# Patient Record
Sex: Female | Born: 1940 | Race: Black or African American | Hispanic: No | Marital: Married
Health system: Midwestern US, Community
[De-identification: ages and names within clinical notes are randomized; demographics above are authoritative.]

## PROBLEM LIST (undated history)

## (undated) DIAGNOSIS — R1900 Intra-abdominal and pelvic swelling, mass and lump, unspecified site: Secondary | ICD-10-CM

## (undated) DIAGNOSIS — R634 Abnormal weight loss: Secondary | ICD-10-CM

## (undated) DIAGNOSIS — N281 Cyst of kidney, acquired: Secondary | ICD-10-CM

## (undated) DIAGNOSIS — I43 Cardiomyopathy in diseases classified elsewhere: Secondary | ICD-10-CM

## (undated) DIAGNOSIS — R911 Solitary pulmonary nodule: Secondary | ICD-10-CM

## (undated) DIAGNOSIS — I10 Essential (primary) hypertension: Secondary | ICD-10-CM

## (undated) DIAGNOSIS — Z1231 Encounter for screening mammogram for malignant neoplasm of breast: Secondary | ICD-10-CM

## (undated) DIAGNOSIS — Z1211 Encounter for screening for malignant neoplasm of colon: Secondary | ICD-10-CM

## (undated) DIAGNOSIS — C50812 Malignant neoplasm of overlapping sites of left female breast: Secondary | ICD-10-CM

## (undated) DIAGNOSIS — R928 Other abnormal and inconclusive findings on diagnostic imaging of breast: Secondary | ICD-10-CM

## (undated) DIAGNOSIS — K862 Cyst of pancreas: Secondary | ICD-10-CM

## (undated) DIAGNOSIS — R0602 Shortness of breath: Secondary | ICD-10-CM

## (undated) DIAGNOSIS — K869 Disease of pancreas, unspecified: Secondary | ICD-10-CM

## (undated) DIAGNOSIS — E210 Primary hyperparathyroidism: Secondary | ICD-10-CM

## (undated) DIAGNOSIS — N179 Acute kidney failure, unspecified: Principal | ICD-10-CM

## (undated) DIAGNOSIS — F02A4 Dementia in other diseases classified elsewhere, mild, with anxiety (HCC): Secondary | ICD-10-CM

## (undated) DIAGNOSIS — N1832 Chronic kidney disease, stage 3b (HCC): Principal | ICD-10-CM

## (undated) DIAGNOSIS — N184 Chronic kidney disease, stage 4 (severe): Principal | ICD-10-CM

## (undated) DIAGNOSIS — E785 Hyperlipidemia, unspecified: Secondary | ICD-10-CM

## (undated) DIAGNOSIS — E215 Disorder of parathyroid gland, unspecified: Secondary | ICD-10-CM

## (undated) DIAGNOSIS — M1990 Unspecified osteoarthritis, unspecified site: Secondary | ICD-10-CM

## (undated) HISTORY — PX: LAPAROTOMY: SHX154

## (undated) HISTORY — PX: GASTRECTOMY: SHX58

## (undated) HISTORY — PX: TONSILLECTOMY: SUR1361

## (undated) HISTORY — PX: BREAST EXCISIONAL BIOPSY: SUR124

---

## 2005-12-31 NOTE — ED Provider Notes (Signed)
[facility]                      EMERGENCY DEPARTMENT TREATMENT REPORT   NAME:  Kathryn [name]                    PT. LOCATION:     ER  ER21   MR #:         BILLING #: [id number]          DOS: 12/31/2005   TIME: 1:54 P   61-50-31   cc:    Kathryn [name], M.D.   Primary Physician:   The patient was evaluated at 1323 hours.   CHIEF COMPLAINT:   Lump on left breast with pain.   HISTORY OF PRESENT ILLNESS:  This is a 65 year old female with a previous   history of breast cancer, had a mastectomy on the left and also   chemotherapy, no radiation.  These treatments happened approximately 15   years ago.  The patient states that she noticed a tender lump around the   mastectomy scar last night.  The patient has no primary care Kathryn [name].  She   recently moved from [place].  States it is about time for her yearly chest   x-ray and mammogram.  Denies recent weight loss.  Admits to night sweats   but states she has had those for years and they have not changed.  Denies   fevers, recent illness, any other complaint.   REVIEW OF SYSTEMS:   CONSTITUTIONAL:  No fever, chills, weight loss.   EYES:  No visual symptoms.   ENT: No sore throat, runny nose or other URI symptoms.   ENDOCRINE:  No diabetic symptoms.   HEMATOLOGIC/LYMPHATIC:  No excessive bruising or lymph node swelling.   RESPIRATORY:  No cough, shortness of breath, or wheezing.   CARDIOVASCULAR:  No chest pain, chest pressure, or palpitations.   GASTROINTESTINAL:  No vomiting, diarrhea, or abdominal pain.   GENITOURINARY:  No dysuria, frequency, or urgency.   MUSCULOSKELETAL:  No joint pain or swelling.   INTEGUMENTARY:  No rashes.   NEUROLOGICAL:  No headaches, sensory or motor symptoms.   Denies complaints in any other system.   PAST MEDICAL HISTORY:   Includes hypertension, breast cancer.   PAST SURGICAL HISTORY:   Hysterectomy, mastectomy and tonsillectomy.   PSYCHIATRIC HISTORY:  Anxiety and depression.    SOCIAL HISTORY:  Denies alcohol, tobacco, and drug use.  Lives with   others.   FAMILY HISTORY:   Hypertension, diabetes, asthma.   MEDICAL ALLERGIES:  Include Penicillin and sulfa.   CURRENT MEDICATIONS: Norvasc, hydrochlorothiazide, Lexapro, Aricept and   oxaprozin.   PHYSICAL EXAMINATION:   VITAL SIGNS:  Blood pressure 152/83, respirations 16, O2 saturation 99% on   room air.  Pulse 75, temperature 97.7.  Pain 0/10.   GENERAL APPEARANCE:  The patient appears well developed and well nourished.   Appearance and behavior are age and situation appropriate.   HEENT:   Eyes:  Conjunctivae clear, lids normal.  Pupils equal,   symmetrical, and normally reactive.    Ears/Nose:  Hearing is grossly   intact to voice.  Internal and external examinations of the ears and nose   are unremarkable.    Mouth/Throat:  Surfaces of the pharynx, palate, and   tongue are pink, moist, and without lesions.   LYMPHATIC:  No cervical or submandibular lymphadenopathy palpated.   RESPIRATORY:  Clear and equal  breath sounds.  No respiratory distress,   tachypnea, or accessory muscle use.   CARDIOVASCULAR:  Heart regular, without murmurs, gallops, rubs, or thrills.   PMI not displaced.   CHEST:  Scarring noted from left-sided mastectomy.  There is no erythema or   fluctuance. There is an area on the medial aspect of the scar that is   palpable, feels like scar tissue.  The patient states it is tender and that   she did not notice it until last night when she noticed it was tender, so   is not sure if it has been there or if it is new.  There is no   cellulitic-appearing skin.  No discharge.   GI:  Abdomen soft, nontender, without complaint of pain to palpation.  No   hepatomegaly or splenomegaly.   MUSCULOSKELETAL:   Stance and gait appear normal.  Nails:  No clubbing or   deformities.  Nail beds pink with prompt capillary refill.   SKIN:  Warm and dry without rashes.   NEUROLOGIC:  Cranial nerves, deep tendon reflexes, strength, and light    touch sensation are unremarkable.   CONTINUATION BY DR. MANOLIO:   COURSE IN THE EMERGENCY DEPARTMENT:  I explained to the patient that she   will need to have outpatient  follow-up to get her chest x-ray and   mammogram and further evaluation of this painful lump.  She is given Dr.   Lafayette Dragon, medicine on call, and her sister also sees [facility] and   will try to get her in there.  There is no fluctuance or any sign of   abscess; however, with the tenderness will place her on 5 days of Keflex   and some Vicodin until she can follow up with Dr. Lafayette Dragon.  She is urged to   follow up as soon as possible as there is still the slight chance of breast   cancer recurrence even though it has been 15 years.   DIAGNOSES:      1. Left breast pain/lump.      2. Status post left mastectomy 15 years ago.   DISPOSITION:  As above.   Electronically Signed By:   Imogene Burn, M.D. 01/07/2006 02:00   ____________________________   Imogene Burn, M.D.   My signature above authenticates this document and my orders, the final   diagnosis(es), discharge prescription(s) and instructions in the [facility] record.   jj  D:  12/31/2005  T:  12/31/2005  3:42 P   [id number]   KARI TOWNS, PA-C

## 2006-04-01 NOTE — Procedures (Signed)
[facility]                              CARDIOLOGY DEPARTMENT                              ECHOCARDIOGRAM REPORT   NAME:  Kathryn [name], Kathryn [name]                            SS#:      [id number]   DOB:    1940/11/14                                      AGE:     65   SEX:    F                                           ROOM#:  OP   MR#:    [id number]                                       DATE:    04/01/2006   BILLING# [id number]   REFERRING PHYS:  Paulla Fore, M.D.   Reason For Study:  Chest pain and shortness of breath.   M-Mode Measurements (Parasternal):                Measured:   Normal:   RVDd                                       -- mm                    7-23 mm   IVSD                                       10 mm                    7-11 mm   LVIDd                                      48 mm                   37-54 mm   LVPWd                                      10 mm                    7-11 mm   LVIDs                                      38  mm   Aortic Root Diameter                       25 mm                   20-37 mm   ACS                                        -- mm                   16-26 mm   Left Atrial Diameter                       38 mm                   19-40 mm   2-Dimensional and Doppler Findings:   INDICATION FOR STUDY:  Shortness of breath and chest pain.   TESTS PERFORMED:  M-mode, 2-dimensional echocardiography; color flow and   quantitative spectral Doppler echocardiography.   FINDINGS:   1. Technically fair quality study.   2. There is normal biventricular chamber sizes and wall thickness.      Top-normal biatrial sizes.   3. Overall severe left ventricular systolic dysfunction with estimated      ejection fraction 0.2 with severe global hypokinesia.  Right ventricular      systolic function appears normal.   4. Mild sclerotic thickening of the trileaflet aortic valve with normal      cusp separation which excludes stenosis.  The mitral valve displays       normal structure and motion consistent with diminished forward cardiac      output with increased E point septal separation.   5. The pulmonic and tricuspid valves display normal structure and motion.   6. No intracardiac mass or pericardial effusion.   7. 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  No evidence for stenosis of any of the      4 valves or intracardiac shunting.  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  The mitral diastolic inflow   velocity pattern and tissue Doppler of the lateral left ventricular   myocardium adjacent to the annulus suggests a moderate left ventricular   early diastolic relaxation abnormality.   Electronically Signed By:   Donnel Saxon, M.D. 04/02/2006 08:00   __________________________________________________   Barbara Cower. MILLER, M.D.   M-Modes Only Dictated by Jamelle Haring, ECH[employer]CH   md  D: 04/01/2006  T: 04/01/2006  2:48 P    [id number]/ [id number] D&amp;T   04/01/06   cc:   Paulla Fore, M.D.         Ramon Dredge C. MILLER, M.D.

## 2006-04-01 NOTE — Procedures (Signed)
[facility]                              CARDIOLOGY DEPARTMENT                              ECHOCARDIOGRAM REPORT   NAME:  Kathryn [name], Kathryn [name]                            SS#:      [id number]   DOB:    12-16-1940                                      AGE:     65   SEX:    F                                           ROOM#:  OP   MR#:    [id number]                                       DATE:    04/01/2006   BILLING# [id number]   REFERRING PHYS:  Paulla Fore, M.D.   Reason For Study:  Chest pain and shortness of breath.   M-Mode Measurements (Parasternal):                Measured:   Normal:   RVDd                                       -- mm                    7-23 mm   IVSD                                       10 mm                    7-11 mm   LVIDd                                      48 mm                   37-54 mm   LVPWd                                      10 mm                    7-11 mm   LVIDs                                      38  mm   Aortic Root Diameter                       25 mm                   20-37 mm   ACS                                        -- mm                   16-26 mm   Left Atrial Diameter                       38 mm                   19-40 mm   2-Dimensional and Doppler Findings:   INDICATION FOR STUDY:  Shortness of breath and chest pain.   TESTS PERFORMED:  M-mode, 2-dimensional echocardiography; color flow and   quantitative spectral Doppler echocardiography.   FINDINGS:   1. Technically fair quality study.   2. There is normal biventricular chamber sizes and wall thickness.      Top-normal biatrial sizes.   3. Overall severe left ventricular systolic dysfunction with estimated      ejection fraction 0.2 with severe global hypokinesia.  Right ventricular      systolic function appears normal.   4. Mild sclerotic thickening of the trileaflet aortic valve with normal      cusp separation which excludes stenosis.  The mitral valve displays      normal  structure and motion consistent with diminished forward cardiac      output with increased E point septal separation.   5. The pulmonic and tricuspid valves display normal structure and motion.   6. No intracardiac mass or pericardial effusion.   7. 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  No evidence for stenosis of any of the      4 valves or intracardiac shunting.  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  The mitral diastolic inflow   velocity pattern and tissue Doppler of the lateral left ventricular   myocardium adjacent to the annulus suggests a moderate left ventricular   early diastolic relaxation abnormality.   Electronically Signed By:   Donnel Saxon, M.D. 04/02/2006 08:00   __________________________________________________   Barbara Cower. MILLER, M.D.   M-Modes Only Dictated by Jamelle Haring, ECHOTECH   md  D: 04/01/2006  T: 04/01/2006  2:48 P    [id number]/ [id number] D&amp;T   04/01/06   cc:   Paulla Fore, M.D.         Ramon Dredge C. MILLER, M.D.

## 2006-05-05 NOTE — ED Provider Notes (Signed)
[facility]                      EMERGENCY DEPARTMENT TREATMENT REPORT   NAME:  Kathryn [name], Kathryn [name]            PT. LOCATION:    ER  ER47       DOB:  [birth year]                                                                     AGE:  66   MR #:       BILLING #:           DOS: 05/05/2006  TIME: 4:47 P   SEX:  F   84-13-24    [id number]   cc:   Primary Physician:   CHIEF COMPLAINT:   Chest pain.   HISTORY OF PRESENT ILLNESS:   A 66 year old female presents with complaints   of chest pain, shortness of breath.  The patient with history of   cardiomyopathy, had been seen recently with a recent cardiac   catheterization, which she states revealed cardiomyopathy.  Presents today   with complaints of chest pain, shortness of breath, states the pain is   precordial, nonradiating.  Pain resolved at time of evaluation in the   emergency department.   REVIEW OF SYSTEMS:   CONSTITUTIONAL:  No fever, chills, weight loss.   EYES: No visual symptoms.   ENT: No sore throat, runny nose or other URI symptoms.   RESPIRATORY:  Positive shortness of breath.   CARDIOVASCULAR:   Precordial chest discomfort.   GASTROINTESTINAL:  No vomiting, diarrhea, or abdominal pain.   GENITOURINARY:  No dysuria, frequency, or urgency.   NEUROLOGICAL:  No headaches, sensory or motor symptoms.   Denies complaints in any other system.   PAST MEDICAL HISTORY:  Hypertension, cardiomyopathy, previous chemotherapy   for breast cancer.   SOCIAL HISTORY: Denies alcohol or tobacco use.   FAMILY HISTORY:  Hypertension, diabetes, asthma.   PHYSICAL EXAMINATION:   GENERAL:   Elderly appearing female, awake, alert.   VITAL SIGNS:  Blood pressure 103/33, heart rate 64, respiratory rate 18,   temperature 95.9.   HEENT:   Anicteric sclerae.  Nasopharynx unremarkable.  Oropharynx   unremarkable.   NECK:  Supple, nontender, symmetrical, no masses or JVD, trachea midline,   thyroid not enlarged, nodular, or tender.    RESPIRATORY:  Clear and equal breath sounds.  No respiratory distress,   tachypnea, or accessory muscle use.   CARDIOVASCULAR:  Heart regular, without murmurs, gallops, rubs, or thrills.   PMI not displaced.   CHEST:  Chest symmetrical without masses or tenderness.   GI:  Abdomen soft, nontender, without complaint of pain to palpation.  No   hepatomegaly or splenomegaly. No abdominal or inguinal masses appreciated   by inspection or palpation.   EXTREMITIES:  Unremarkable.   SKIN:  Warm and dry.   NEUROLOGICAL:  Grossly nonfocal examination.  Alert and oriented.   INITIAL IMPRESSION/MANAGEMENT PLAN:   Precordial chest discomfort and   dyspnea.  The patient with a previous history of cardiomyopathy but she   states normal coronaries.   Diagnostic studies obtained.   DIAGNOSTIC STUDIES:   BTNP mildly  elevated 260.4.  Troponin unremarkable.   BMP normal.  CPK profile, myoglobin normal.   Chest x-ray negative.  CBC   with WBC 4.7, hemoglobin 13 and hematocrit 41. EKG sinus rhythm without   acute ST changes present.   COURSE IN THE EMERGENCY DEPARTMENT:   I discussed the patient with Dr.   Rosario Jacks who initially was going to admit the patient for further monitoring   and then came and evaluated the patient and decided to discharge the   patient, when she was asymptomatic and wanted to go home.  The patient   discharged same with instructions to follow up as an outpatient.  Return if   any worsening symptoms or further problems.   CONDITION ON DISCHARGE:  Stable.   FINAL DIAGNOSES:      1. Precordial chest pain and dyspnea.      2. History of cardiomyopathy.   Electronically Signed By:   Haze Justin, M.D. 05/19/2006 15:22   ____________________________   Haze Justin, M.D.   My signature above authenticates this document and my orders, the final   diagnosis(es), discharge prescription(s) and instructions in the [facility] record.   dh  D:  05/05/2006  T:  05/05/2006  5:29 P   [id number]/

## 2006-05-05 NOTE — Consults (Signed)
[facility]                               CONSULTATION REPORT                        CONSULTANT:  Cyndia Diver, M.D.   NAMESHARALYN, LOMBA   BILLING #:  [id number]          DATE OF CONSULT:        05/05/2006   MR #:       [id number]           ADM DATE:               05/05/2006   SS #        [id number]        PT. LOCATION:           ER  [id number]   DOB:        AGE:  65           SEX:                    F   1940/07/28   ATTENDING:  Haze Justin, M.D.   cc:    Cyndia Diver, M.D.          Paulla Fore, M.D.          Haze Justin, M.D.   CHIEF COMPLAINT:  Chest pain.   REASON FOR CONSULTATION:  Chest pain and history of cardiomyopathy.   Thank you for asking [place] to see Ms. Kristopher Delk in cardiology   consultation in [facility] Emergency Department on 05-05-06   for her complaints of chest pain.  As you recall, Ms. Caudill is a pleasant   66 year old female with a history of dilated nonischemic cardiomyopathy and   mitral valve disease as well as previous history of congestive heart   failure who presented to the [facility] Emergency   Department with complaints of sharp chest pains and some dyspnea.  She   reports she has had 5 episodes of chest discomfort over the past 2 months.   She describes the discomfort as a sharp, midsternal discomfort not made   better or worse by any maneuvers, of moderate intensity.  Today she became   somewhat diaphoretic with one of those episodes which prompted her to come   to the emergency department.  She has been taking nitroglycerin which does   relieve her pain after around 10 minutes.  The discomfort is primarily at   rest, not associated with activity and not otherwise made better or worse   by any maneuvers.  She denies any worsening dyspnea, no PND, orthopnea or   edema.  She does have chronic exertional dyspnea.  No palpitations or    syncope, no presyncope.  Of note, she has had recent up titration of her   Coreg to 25 mg b.i.d. over the last 1-1/2 weeks.  She has discontinued her   Nexium and she continues to take Diclofenac on an as-needed basis for   arthritic pains.  Her husband reports that she also has significant snoring   and there is significant clinical suspicion for underlying obstructive   sleep apnea.  She appears to be under a great deal of stress, taking care   of 7 grandchildren at home and she also  has not been eating well and admits   to mild depression.   REVIEW OF SYSTEMS:  Otherwise negative for any stroke or visual changes, no   cough or wheezing, no bright red blood per rectum or melena, no polyuria or   polydipsia, no dysuria or frequency.  No abdominal pain, nausea, vomiting,   fever or chills.  No rashes.  She denies any dietary indiscretions.  She   watches her salt.  She has not had any recent volume excess.  She has been   compliant with her medications except that she stopped her Nexium.  The   remainder of the review of systems is negative.  She does report some   dysthymia.   PAST MEDICAL HISTORY:      1. Cardiac dysfunction as manifest by:            a. Dilated nonischemic cardiomyopathy diagnosed 04-01-06 by      echocardiogram with ejection fraction of 20%.            b. Cardiac catheterization 04-15-06 revealing angiographically      normal coronary arteries.            c. Moderate mitral regurgitation, not surgical grade as      demonstrated by transesophageal echocardiogram on 04-28-06.            d. 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            e. Ejection fraction 20%.      2. Systemic arterial hypertension.      3. Gastroesophageal reflux disease currently untreated; she does have      acid reflux symptomatology.      4. Dyslipidemia.      5. Reactive airways disease.      6. Poor memory, on Aricept.      7. Previous history of breast cancer, status post mastectomy and       chemotherapy years ago.      8. History of hysterectomy.      9. History of hysterectomy.   FAMILY HISTORY:   Significant for stroke.  Father died at 30 years of age   of a stroke.  Mother died at 5 of diabetes and hypertension.  A brother   has hypertension and dyslipidemia.   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   She is a nonsmoker/nondrinker.  She has occasional caffeine.  She is   active, takes care of 7 grandchildren.  She walks without any limitation,   but is not very active.  She is not exercising regularly.  She reports a   decrease in exercise tolerance.   CURRENT MEDICAL REGIMEN:      1. Lisinopril 40 mg daily.      2. Lexapro 10 mg daily.      3. Diclofenac 75 mg b.i.d.      4. Aldactone 12.5 mg daily which was recently increased to 25 mg daily.      5. Lasix 20 mg b.i.d.      6. Nitroglycerin as needed.      7. Coreg 25 mg b.i.d., was recently dose increased.      8. Zocor 40 mg q.h.s.      9. Aricept 10 mg daily.      10. Aspirin 81 mg daily.   PHYSICAL EXAMINATION:   GENERAL:  She is a pleasant 66 year old female in no acute distress, well   developed, well  nourished, normal mood and affect, resting comfortably in   the bed.  She is accompanied by her husband.   VITAL SIGNS:  Blood pressure 112/78, heart rate 60, respiratory rate 18,   she is afebrile.   HEENT:  Normocephalic, atraumatic.  Extraocular muscles intact.  No thrush   or xanthelasma.  Conjunctivae are clear, sclerae are anicteric.   NECK:  Trachea midline, no lymphadenopathy or thyromegaly, no JVD.  Jugular   venous pressure is around 7 cm.   LUNGS:  Clear to auscultation, no wheezes or rhonchi, normal effort, no   accessory muscle use.   HEART:  Normal S1 and S2.  Apical impulse is diffuse and laterally   displaced.  No murmurs, rubs or gallops.  No right ventricular heave.  The   emergency department was somewhat noisy and I did not appreciate her mitral    valve murmur within the limits of that exam.   ABDOMEN:  Soft, nontender, nondistended, bowel sounds positive, no guarding   or rebound, no hepatosplenomegaly, no hepatojugular reflux.   EXTREMITIES:  No clubbing, cyanosis or edema.  Warm and well perfused.   NEUROLOGICAL:  She is alert and oriented, moves all 4 extremities.  Cranial   nerves II-XII are intact.  Her affect is somewhat flat at times.  Distal   pulses and carotid pulses are 2+ and symmetric.   ECG reveals sinus rhythm with non-specific ST T changes which are unchanged   compared to her previous ECG obtained from our office from 04-06-06.   Review of blood work reveals platelet count 412,000, white blood cell count   4,700, hemoglobin 13.8, BUN 14, creatinine 1.1, glucose 93, sodium 138,   potassium 4, chloride 102, CO2 29.  Review of her cardiac catheterization   performed by Dr. Phil Dopp on 04-15-06 reveals pulmonary artery pressures at   that time of 49/23 with a mean of 34, post capillary wedge pressure of 19   with very small V waves and oxygen saturation around 50% in the pulmonary   artery, with a depressed cardiac output of 2.8 liters per minute at that   time.  She has since been up titrated on medication and has had diuretics.   Her coronaries were angiographically normal.  Subsequent transesophageal   echocardiogram, based on records retrieved from [facility], reveal that her degree of mitral regurgitation was in the mild to   moderate range and ejection fraction was around 20%.   OVERALL IMPRESSION:      1. Chest pain syndrome with primarily atypical features for angina in a      patient with dilated nonischemic cardiomyopathy.      2. Angiographically normal coronary arteries by recent assessment.      3. Severe left ventricular dysfunction, ejection fraction 20%.      4. Compensated from a congestive heart failure standpoint with normal      jugular venous pressure and no evidence of congestion.       5. Untreated gastroesophageal reflux exacerbated by chronic Diclofenac      therapy, like gastrointestinal etiology for her chest pain syndrome.      6. Exertional dyspnea secondary to cardiomyopathy.      7. No airspace disease on chest x-ray.      8. Dyslipidemia.      9. Hypertension, well controlled.   DISCUSSION/RECOMMENDATIONS:  The patient has an extensive cardiac workup   recently including cardiac catheterization, transesophageal echocardiogram,   and right  heart catheterization.  She is compensated from a volume   standpoint.  Her chest pain is almost certainly nonischemic in etiology and   most likely related to GI dysfunction.  She does have clinical features   that are highly suspicious for obstructive sleep apnea and she has   untreated gastroesophageal reflux.  I did ambulate her in the halls and she   was able to walk around the emergency department without any significant   dyspnea.  She wants to go home and I think that is reasonable.  I do not   see any indication for any additional diuretic therapy on top of what she   is already on, but I would like to put her back on her Nexium at 40 mg   daily to continue indefinitely at this point.  We went over acid reflux   symptomatology.  I have counseled her on stopping her diclofenac, weighing   herself regularly and watching her fluid intake.  She will follow up with   [facility] as an outpatient.  If her chest pain symptoms do   not resolve, she may need further workup for other etiologies, but for the   time being I think treatment for GI dysfunction should suffice.  She will   need to be considered for a cardioverter defibrillator placement for   primary prevention of sudden death if she does not have ejection fraction   of greater than 35% after a good 6-9 months of optimal medical therapy.   Finally, at the time of her cardiac catheterization, she did have    significant low output state.  This will need to be monitored closely as   her Coreg is up titrated and if she does not tolerate the high-dose Coreg   she may need to be down titrated on that.  I did not feel that low output   state was currently a significant issue.   Otherwise, she will be continued on her spironolactone, lisinopril, Coreg,   Zocor for her cardiomyopathy and will be following up with Dr. Edwin Dada and   with outpatient office visit with [facility].  She was   treated with GI   cocktail in the emergency department and was asymptomatic at the time of   discharge.   Thank you very much for allowing [place] to participate in the care of this   patient.   Electronically Signed By:   Donnel Saxon, M.D. 05/07/2006 21:21

## 2006-09-08 NOTE — ED Provider Notes (Signed)
[facility]                      EMERGENCY DEPARTMENT TREATMENT REPORT   NAME:  Kathryn [name], Kathryn [name]          PT. LOCATION:    ER  [id number]       DOB:  [birth year]                                                                     AGE:  66   MR #:       BILLING #:           DOS: 09/08/2006  TIME: 4:57 A   SEX:  F   61-50-31    [id number]   cc:   Primary Physician:   CHIEF COMPLAINT:   Heaviness in the chest and shortness of breath.   HISTORY OF PRESENT ILLNESS:  This is a 66 year old female who arrived to   the emergency department complaining of increased shortness of breath for   the last couple of days.  She has some chest heaviness now.  No fever, no   chills, no nausea, no vomiting.  No pleuritic pain.  No fevers.  No cough.   REVIEW OF SYSTEMS:   CONSTITUTIONAL:  No fever, chills, weight loss.   EYES: No visual symptoms.   ENT: No sore throat, runny nose or other URI symptoms.   ENDOCRINE:  No diabetic symptoms.   ALLERGIC/IMMUNOLOGIC:  No urticaria or allergy symptoms.   RESPIRATORY:  Complaining of shortness of breath.   CARDIOVASCULAR:  Complaining of chest heaviness.   GASTROINTESTINAL:  No vomiting, diarrhea, or abdominal pain.   GENITOURINARY:  No dysuria, frequency, or urgency.   MUSCULOSKELETAL:  No joint pain or swelling.   INTEGUMENTARY:  No rashes.   NEUROLOGICAL:  No headaches, sensory or motor symptoms.   PAST MEDICAL HISTORY:   Dilated nonischemic cardiomyopathy, diagnosed   04/01/06 by echocardiogram with ejection fraction of 20%.  Cardiac   catheterization on 04/15/06 revealing angiographically normal coronary   arteries, moderate mitral regurgitation, not surgically graded as   demonstrated on transesophageal echo on 04/28/06.  Moderate pulmonary   hypertension, systemic arterial hypertension, gastroesophageal reflux   disease, dyslipidemia, reactive airway disease, poor memory on Aricept.   History of breast cancer status post mastectomy and chemotherapy.  History    of hysterectomy.   FAMILY HISTORY:   Significant for stroke; father died at 46 from a stroke.   Mother died of diabetes and hypertension.  Brother has hypertension and   dyslipidemia.   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   ALLERGIES:   See Ibex chart.   MEDICATIONS:   See Ibex chart.   PHYSICAL EXAMINATION:   VITAL SIGNS:  Blood pressure 115/47, pulse 62, respirations 20, temperature   98.6.  On a pain scale of 0-10, the patient is a  0/10.  Saturation of   100%.   GENERAL APPEARANCE:  The patient appears well developed and well nourished.   Appearance and behavior are age and situation appropriate.   HEENT:  Head normocephalic and atraumatic.  Eyes:  Conjunctivae clear, lids   normal.  Pupils equal, symmetrical, and normally reactive.  NECK:  Supple, nontender, symmetrical, no masses or JVD, trachea midline.   Thyroid not enlarged, nodular, or tender.   RESPIRATORY:  Clear and equal breath sounds.  No respiratory distress,   tachypnea, or accessory muscle use.   CARDIOVASCULAR:  Grade 2/6 systolic ejection murmur.  Carotid pulses 2+ and   equal bilaterally without bruits. Aortic pulsation not widened.  No bruits   auscultated. Femoral pulses 2+ and equal bilaterally. DP pulses 2+ and   equal bilaterally. No peripheral edema or significant varicosities.   GI:  Abdomen soft, nontender, without complaint of pain to palpation.  No   hepatomegaly or splenomegaly.   SKIN:  Warm and dry without rashes.   NEUROLOGIC:  Alert, oriented.  Sensation intact, motor strength equal and   symmetric.   COURSE IN THE EMERGENCY DEPARTMENT:  Will do cardiac profile, BTNP, chest   x-ray, EKG.  Rule out heart failure, rule out myocardial ischemia.  Chest   x-ray showed no acute cardiopulmonary disease.  BTNP was 20.  Hemoglobin   11.3, hematocrit 34.  CPK profile, myoglobin, troponin were all normal.   CMP was normal.  The patient was given 1 mg of Dilaudid and 4 mg of Zofran.   FINAL DIAGNOSES:       1. Acute chest pain evaluation.      2. Dyspnea evaluation.   DISPOSITION: Take medication as directed.  Return back to the emergency   department if there is any chest pain or shortness of breath.   Electronically Signed By:   Jerilynn Som, M.D. 09/17/2006 07:58   ____________________________   Jerilynn Som, M.D.   My signature above authenticates this document and my orders, the final   diagnosis(es), discharge prescription(s) and instructions in the [facility] record.   ZGA  D:  09/14/2006  T:  09/14/2006  5:55 A   [id number]/   Hilaria Ota, PA-C

## 2006-09-08 NOTE — Procedures (Signed)
*  Normal sinus rhythm rate 61 per minute   *NOnspecific T wave abnormality   *No major change compared to prior tracing 05-05-06   *Normal sinus rhythm.   *Possible left ventricular hypertrophy.   *Anterior T-wave changes possibly consistent with ischemia.   *No prior tracing.   OBX

## 2007-01-05 NOTE — ED Provider Notes (Signed)
[facility]                      EMERGENCY DEPARTMENT TREATMENT REPORT   NAME:  Jonette Eva B         PT. LOCATION:      ER  [phone]          DOB:                                                                         AGE:   MR #:      BILLING #:           DOA:  01/05/2007   DOD:              SEX:  F   61-50-31   [id number]   cc:   Primary Physician:   Time of evaluation: 1635.   CHIEF COMPLAINT:  Short of breath.   HISTORY OF PRESENT ILLNESS:  This is a 66 year old female who presents to   the emergency department this afternoon for evaluation of shortness of   breath since this past Saturday.  Per the patient's report, she has also   had a productive cough and some cramping of her muscles in her upper and   lower extremities.  The patient is currently short of breath, but the   patient currently denies any nausea, vomiting, diaphoresis, or chest pain.   The patient did have an echocardiogram in January 2008 which demonstrated   an ejection fraction of 20%.  The patient also has a history of moderate   mitral regurgitation and pulmonary hypertension as well.  Per the patient's   report, upon arising from a seated position she also feels dizzy and seeks   further evaluation in the emergency department for complaints at this time.   REVIEW OF SYSTEMS:   CONSTITUTIONAL:  No fever, chills, weight loss.   EYES: No visual symptoms.   RESPIRATORY:  The patient admits cough.  The patient admits short of   breath.  The patient denies wheeze.   CARDIOVASCULAR:  No chest pain, chest pressure, or palpitations.   GASTROINTESTINAL:  No vomiting, diarrhea, or abdominal pain.   NEUROLOGICAL:  No headaches, sensory or motor symptoms.   PAST MEDICAL HISTORY:  Dilated nonischemic cardiomyopathy diagnosed   04/01/06 by echocardiogram with ejection fraction of 20%.  Cardiac   catheterization 04/15/06 revealing angiographically normal coronary    arteries, moderate mitral regurgitation, moderate pulmonary hypertension,   systemic arterial hypertension, GERD, dyslipidemia, reactive airway   disease, poor memory, breast cancer status post mastectomy and   chemotherapy, history of hysterectomy.   SOCIAL HISTORY:  Denies alcohol, tobacco, or drug abuse.   FAMILY HISTORY:  Hypertension, diabetes, asthma.   ALLERGIES:  Penicillin, sulfa.   MEDICATIONS:  Listed and reviewed in Ibex.   PHYSICAL EXAMINATION:   GENERAL APPEARANCE:  The patient appears well developed and well nourished.   Appearance and behavior are age and situation appropriate.   VITAL SIGNS:  Blood pressure 90/38, pulse 59, respiratory rate 16,   temperature 96.3, O2 saturation 97% on room air.   RESPIRATORY:  The patient's breath sounds are clear and equal in all lung  fields without wheezing, rhonchi, or rales appreciated.  The patient is not   in respiratory distress, is not tachypneic, and is not utilizing accessory   muscles to breathe.   CARDIOVASCULAR:  Regular rate and rhythm, 1/6 systolic murmur appreciated.   No gallops, rubs, or thrills appreciated.  The patient has no peripheral   edema or significant varicosities.   MUSCULOSKELETAL:  The patient's gait is mildly ataxic.  The patient has   full range of motion of her extremities.   SKIN:  Warm and dry without rashes.   NEUROLOGIC:  Alert, oriented.  Sensation intact, motor strength equal and   symmetric.   INITIAL ASSESSMENT AND MANAGEMENT PLAN: This is a 66 year old female with   known congestive heart failure and multiple comorbidities for heart disease   who presents with complaints of shortness of breath.  She also appears to   present with some orthostatic hypotension.  We will give her 250-mL bolus   of IV normal saline, obtain a chest x-ray, cardiac enzymes, and also BTNP,   CBC, and BMP to further assess the patient.  The patient will be   reevaluated upon receipt of these tests and laboratory results.    CONTINUATION BY DR. MANOLIO:   DIAGNOSTIC INTERPRETATION:  The EKG showed normal sinus rhythm and no   ischemic changes.  BTNP and cardiac enzymes are all normal except for   minimal elevation of myoglobin of 85.0.  Chest x-ray read negative by   radiology.  BMP shows elevated BMP and creatinine of 43 and 2.2.  Most   recent BUN and creatinine June 2008 was 25 and 0.9.  CBC is normal except   for elevated platelet count of 546,000.   COURSE IN THE EMERGENCY DEPARTMENT:  The patient's initial blood pressure   was 76, came up quickly with 200 mL of saline bolus.  She has been fine   ever since, watched on cardiac and blood pressure monitors and remained in   sinus rhythm.  She is still feeling mildly short of breath although is able   to lie flat and O2 saturation is 99% on room air.  She is willing to stay   overnight for further hydration and serial cardiac enzymes.  I called Dr.   Dawna Part, who accepts the patient to their service with a consult to Dr.   Phil Dopp, her cardiologist.   PROCEDURE:  Critical care time including direct patient care, reviewing   medical record, evaluating results of diagnostic testing, discussions with   family members, and consulting with physicians: 55 minutes.   DIAGNOSES:   1. Acute hypotension with dizziness.   2. Dyspnea, etiology unclear.   DISPOSITION:  The patient is admitted in stable condition to telemetry.   Electronically Signed By:   Imogene Burn, M.D. 01/09/2007 20:59   ____________________________   Imogene Burn, M.D.   My signature above authenticates this document and my orders, the final   diagnosis(es), discharge prescription(s) and instructions in the [facility] record.   CD  D:  01/05/2007  T:  01/05/2007  8:36 P   [id number]   Roderic Scarce, PA-C

## 2007-01-06 NOTE — H&P (Signed)
[facility]                              HISTORY AND PHYSICAL                             CHARLES W. [name], M.D.   Kathryn [name], Kathryn [name]   MR #:    [id number]                  ADM DATE:          01/05/2007   BILLING  [id number]                 PT. LOCATION       [id number]   #:   SS #     [id number]               DOB:  January 09, 1941   AGE:  66   CHARLES W. [name], M.D.             SEX:  F   cc:    Kathryn [name], M.D.   CHIEF COMPLAINT:  Muscle cramp, shortness of breath and hypotension.   Her initial  blood pressure in the emergency room was low, running about   89/50 with a pulse of 65, temperature 96.3.  She was also noted to have a   BUN now of 43 and a creatinine of 2.2 and normally, she was running 20 and   1 when she was admitted a few months ago.   PAST HISTORY:  She has a history of a nonischemic cardiomyopathy with   ejection fraction about 20%.  She had a negative cardiac cath earlier this   year, that was in January  of this year.  She has had some nausea,   vomiting, diarrhea  recently,  but not the last few days. Denies any   abdominal pain, does have some chest discomfort.  No radiation.   CURRENT MEDICATIONS INCLUDE:   1. Coreg 12.5 mg b.i.d.   2. Aspirin 81 mg daily.   3. Aricept 10 mg daily.   4. Simvastatin 40 mg daily.   5. Lasix 20 mg a day.   6. Lisinopril 40 mg a day.   7. Lexapro 10 mg a day.   8. Spironolactone 25 mg daily.   9. Prilosec 20 mg daily.   10. Tylenol 650 q. 4h p.r.n. for pain and fever.   11. Allopurinol 100 mg daily.   12. Metolazone 2.5 mg daily.   She has been treated for acute hypotension with subjective dyspnea and   muscle pain.  She seems much better after hydration.  Her blood pressure   has come up to 115/80.   PAST MEDICAL HISTORY:  Hypertension.  She also had a malignancy of the   breast operated on several years ago, 1992.  Her flu vaccine is not   up-to-date.  She has a history of psychiatric depression and anxiety and    currently is taking the medication as listed above.  She has had   gastroesophageal reflux, also gout and dyslipidemia and has been treated   for reactive airway disease in the past.   PAST SURGICAL HISTORY:  Also includes a hysterectomy, 2004.  Mastectomy,   1992.  Tonsillectomy in the past.   FAMILY HISTORY:   Positive for hypertension, diabetes and asthma, and  she   denies any alcohol abuse, tobacco use or other drugs. She lives with   friends and family.   She did have some episodes of chest pain earlier this   year.  Then, the cardiac cath was negative.   REVIEW OF SYSTEMS: Essentially unremarkable, except as noted above.   PHYSICAL EXAMINATION:   LUNGS:  Currently, lungs clear.   HEART:  Regular rate and rhythm without murmurs, rubs, gallops or click.   ABDOMEN:  Globular, no organomegaly, tenderness, guarding or rebound.   EXTREMITIES:  No clubbing, cyanosis or edema.   NEUROLOGICAL: Oriented to person, place and time.  No apparent weakness.   LABORATORY DATA:  Electrolytes: Sodium 136, potassium 4.1, chloride 98, CO2   28, BUN 43, glucose 90, creatinine 2.2, calcium 9.7, myoglobin 85, BTNP   5.2.  CBC:  WBC 7,600, hemoglobin 11.8, hematocrit 34.8, platelets 546,000   with a normal differential.  CPK 71, iso normal, relative index normal at   0.7.  Troponin less than 0.015.  Chest x-ray:  No acute disease.  EKG:  No   acute changes.   So, the patient was admitted to Telemetry.  Cardiology, Dr. Valinda Hoar group,   notified. They will see patient. Will hold the Lisinopril and diuretics,   hydrate patient with normal saline and check electrolytes and   BUN/creatinine in the morning.  If they still remain high, will get Renal   consultation also, Dr. Gerlean Ren group.  Will also get an ultrasound of the   right upper quadrant and both kidneys.  Symptomatic treatment.  Continue   her medication, except for the diuretic and the ACE.   Electronically Signed By:   Kathryn [name], M.D. 01/06/2007 17:44    ____________________________   Kathryn [name], M.D.   Pauli Daniels  D:  01/06/2007  T:  01/06/2007  4:13 A   [id number]

## 2007-01-06 NOTE — Consults (Signed)
[facility]                               CONSULTATION REPORT                      CONSULTANT:  DEEPAK R. Tally Joe, M.D.   NAMEMayford [name], Kathryn [name]   BILLING #:  [id number]              DATE OF CONSULT:     01/06/2007   MR #:       [id number]               ADM DATE:            01/05/2007   SS #        [id number]            PT. LOCATION:        [id number]               DOB:  Jan 25, 1941       AGE:  66             SEX:   F   ATTENDING:  Billey Chang. MARKS, M.D.   cc:    Billey Chang. MARKS, M.D.          Ayesha Rumpf R. Tally Joe, M.D.   REFERRING PHYSICIAN:  Angelina Sheriff, M.D.   INDICATIONS:  Hypotension with acute renal insufficiency.   PROBLEM LIST:   1.    History of a nonischemic cardiomyopathy with ejection fraction most   recently assessed at 20% and negative cardiac catheterization in January   2008.   2.    Acute renal insufficiency while on therapy with an ACE inhibitor   (patient denies concurrent dehydration or other nephrotoxins).   3.    Hypertension now with relative hypotension.  Symptoms resolved after   volume repletion.   4.    History of breast cancer, status post mastectomy in 1992.   5.    History of hyperlipidemia.   6.    Gastroesophageal reflux disease.   7.    Gout.   8.    Reactive airway disease.   9.    Anxiety/depression.   10.  Hysterectomy in 2004.   11.  Tonsillectomy in the distant past as a child.   HISTORY OF PRESENT ILLNESS:  This patient is a pleasant 66 year old African   American female with a history of a nonischemic cardiomyopathy and ejection   fraction assessed at 20% earlier this year with negative catheterization   who now presents complaining of nausea, vomiting and diarrhea in the recent   past but not the last few days with initial laboratory workup notable for   elevated BUN and creatinine in the setting of symptomatic hypotension.  She   reports she has had some shortness of breath and hypertension with cramps    in her muscles.  As I speak with her, she recently had what sounds like a   viral GI bug causing nausea, vomiting and diarrhea, which has now resolved.   She reports she was taking in adequate fluids as best she could tell, but   in the setting of her ACE inhibitor her BUN and creatinine have elevated   with a BUN now of 43 and creatinine of 2.2 (baseline levels at BUN 20 and   creatinine 1.0 seven months ago).  Otherwise she denies any anginal   sounding chest pains.  She denies any palpitations, syncope, presyncope or   gross congestive heart failure type symptoms.  Her review of systems is   otherwise unremarkable except as detailed in admission notes and emergency   room admission history and physical.  She denies fevers, chills or night   sweats.  She denies any residual infectious type symptoms.  She denies   endocrine type symptoms or neurologic type symptoms.   CURRENT MEDICATIONS:   1.    Coreg 12.5 mg p.o. b.i.d.   2.    Aspirin 81 mg daily.   3.    Aricept 10 mg daily.   4.    Zocor 40 mg daily.   5.    Lasix 20 mg daily.   6.    Lisinopril 40 mg daily.   7.    Lexapro 10 mg daily.   8.    Spironolactone 25 mg daily.   9.    Prilosec 20 mg daily.   10.  Allopurinol 100 mg daily.   11.  Metolazone 2.5 mg daily.   12.  Tylenol 650 mg q.4 h. p.r.n. for pain and fever.   ALLERGIES:  Penicillin and sulfa as previously identified.   FAMILY HISTORY:  Negative for sudden cardiac death or premature coronary   artery disease.  It is notable for hypertension, diabetes mellitus and   asthma.   SOCIAL HISTORY:  She denies tobacco or alcohol use.  She denies illicit   drug use.  She lives with family members and not currently particularly   sexually active.   PHYSICAL EXAMINATION:   VITAL SIGNS:  Heart rate of 65, blood pressure variable between 95 to 119   over 56 to 68 with no residual orthostatic type symptoms.  Temperature   98.1, respiratory rate 20, oxygen saturation 96% on room air.  Ins and outs    overnight, she is taking in approximately 1 liter and had no significant   output as yet.   GENERAL:  This is a pleasant, stable appearing, African American female   sitting upright in bed in no acute distress at this time.  She is hooked up   to an IV pump and is continuing to receive aggressive IV fluid repletion.   HEENT:  Pupils are reactive.  Sclerae are without icterus.  No tenting of   mucous membranes.  She does appear visually dry.   NECK:  Supple.  There is no jugular venous distension seen.  Jugular veins   are flat.  Carotid pulses are within normal limits without audible bruit.   LUNGS:  Clear to auscultation bilaterally with no rales, rhonchi or   wheezes.   CARDIOVASCULAR:  Regular rate and rhythm.  Normal S1 and S2.  I do not   appreciate any murmurs at the present time and she has no gallops or rubs.   PMI is difficult to palpate secondary to breast tissue.   ABDOMEN:  Soft.  Nontender.  Nondistended.  No organomegaly.   EXTREMITIES:  Without clubbing, cyanosis.  She has no significant edema at   the present time.  Distal perfusion is intact.   NEUROLOGIC:  She is alert and oriented x3.  Cranial nerves, sensory and   motor are grossly intact.   LABORATORY STUDIES:  Sodium 136, potassium 4.1, chloride 98, bicarbonate   28, BUN 43, creatinine 2.2, glucose 90, myoglobin 85.  BNP is 5.2.  White   count 7.6,  hemoglobin 11.8, platelets 546.  No left shift noted.  Cardiac   enzymes are negative, now x3 sets.  Chest x-ray reviewed by me shows no   acute cardiopulmonary disease, lung fields are clear and heart is of normal   size.  Electrocardiogram reviewed by me demonstrates an underlying sinus   rhythm at a rate of 60 beats per minute with no significant ST segment   deviation seen.   IMPRESSION:   1.  Acute renal insufficiency in the setting of aggressive antihypertensive   therapy including Aldactone and ACE inhibitor, currently on hold.    2.  Recent gastrointestinal viral infection suspected with resultant   suspected dehydration probably contributing to the above.   3.  History of hypertension now with relative hypotension.   4.  Nonischemic cardiomyopathy, ejection fraction 20% with no coronary   artery disease.   5.  History of breast cancer, status post mastectomy in 1992.   6.  Otherwise as per problem list above.   PLAN:   1.  Includes excellent care by primary service.   Thank you for allowing [place]   to see this patient in consultation with you.   2.  Await result of renal arterial PVL to exclude significant renal   arteriostenosis, although I suspect this will be unlikely.   3.  Repeat 2-D echocardiogram.   4.  Hold off all nephrotoxins including Aldactone and ACE inhibitors;   eventually restart regimen in stages favoring beta-blocker first.   5.  Volume repletion, p.o. and IV.   6.  Check 2-D echocardiogram.   7.  Check lipid studies and liver function tests.   8.  We will follow and assist with ongoing care.   Electronically Signed By:   Donnel Saxon, M.D. 01/06/2007 16:31   _________________________________   Sammuel Hines. Tally Joe, M.D.   TS1  D:  01/06/2007  T:  01/06/2007 11:53 A   [id number]

## 2007-01-07 NOTE — Procedures (Signed)
[facility]                              CARDIOLOGY DEPARTMENT                              ECHOCARDIOGRAM REPORT   NAME:  Kathryn [name], Kathryn [name]                            SS#:      [id number]   DOB:    03-10-1941                                      AGE:     65   SEX:    F                                           ROOM#:  [id number]   MR#:    [id number]                                       DATE:    01/07/2007   BILLING# [id number]   REFERRING PHYS:   Referring Physician:  Angelina Sheriff, MD   Reason For Study:  Acute hypotension with dizziness.   M-Mode Measurements (Parasternal):                Measured:   Normal:   RVDd                                       29 mm                    7-23 mm   IVSD                                       10 mm                    7-11 mm   LVIDd                                      38 mm                   37-54 mm   LVPWd                                       8 mm                    7-11 mm   LVIDs  29 mm   Aortic Root Diameter                       29 mm                   20-37 mm   ACS                                        -- mm                   16-26 mm   Left Atrial Diameter                       33 mm                   19-40 mm   2-Dimensional and Doppler Findings:   INDICATION FOR STUDY:  Hypotension with dizziness.   TESTS PERFORMED:  M-Mode, 2-D echocardiography; color flow and quantitative   spectral Doppler echocardiography; tissue Doppler echocardiography.   FINDINGS:   1. Technically fair to good quality study.   2. There is normal left ventricular chamber size with top normal wall      thickness and low normal systolic function, estimated ejection fraction      between 50%-55%.  Type 1 diastolic dysfunction is suggested by      transmitral Doppler and tissue Doppler.   3. Top normal left atrial size.   4. Normal right ventricular chamber size, wall thickness and systolic      function.    5. Normal right atrial size.   6. The aortic valve is trileaflet, displays mild sclerotic thickening, but      normal cusp separation which excludes stenosis or regurgitation by      Doppler.   7. The mitral valve leaflets display normal structure and motion with no      evidence for stenosis or prolapse.  There is minimal to mild mitral      regurgitation.   8. Pulmonic valve displays normal structure and motion with no evidence for      stenosis or regurgitation.   9. The tricuspid valve displays normal structure and motion with no      evidence for stenosis or prolapse.  There is moderate tricuspid      regurgitation.  Moderate right ventricular and pulmonary artery      hypertension with systolic pressure estimated between 52 and 57 mmHg is      based on tricuspid regurgitant jet velocity of 3.2 m/sec.   10. No intracardiac mass to suggest thrombus, tumor or vegetation.   11. No pericardial effusion.   12. Visualized portion of the aortic root displays normal diameter.   CONCLUSIONS:   1. Normal left ventricular size with top normal size thickness and low      normal systolic function, ejection fraction of 50%-55%.  Type 1      diastolic dysfunction.   2. Top normal right ventricular chamber size and systolic function.   3. Top normal left atrial size with normal right atrial size.   4. Mild sclerotic changes of the aortic valve without stenosis or      regurgitation.  Mild mitral regurgitation and moderate tricuspid      regurgitation with structurally normal valves.   5. Moderate pulmonary artery hypertension with systolic  pressure estimated      between 52 and 57 mmHg.   6. Since prior study of 04-01-06 there has been significant interval      improvement in left ventricular systolic function, with improved mitral      regurgitation and pulmonic regurgitation with unchanged pulmonary artery      hypertension and right heart appearance.   Electronically Signed By:   Donnel Saxon, M.D. 01/10/2007 17:35    __________________________________________________   Barbara Cower. MILLER, M.D.   M-Modes Only Dictated by Gaylyn Lambert, [facility]   MD  D: 01/07/2007  T: 01/07/2007 10:44 A    [id number]/   cc:   Billey Chang. MARKS, M.D.         Ramon Dredge C. MILLER, M.D.

## 2007-01-07 NOTE — Procedures (Signed)
[facility]                              CARDIOLOGY DEPARTMENT                              ECHOCARDIOGRAM REPORT   NAME:  Kathryn [name], Kathryn [name]                            SS#:      [id number]   DOB:    October 30, 1940                                      AGE:     65   SEX:    F                                           ROOM#:  [id number]   MR#:    [id number]                                       DATE:    01/07/2007   BILLING# [id number]   REFERRING PHYS:   Referring Physician:  Angelina Sheriff, MD   Reason For Study:  Acute hypotension with dizziness.   M-Mode Measurements (Parasternal):                Measured:   Normal:   RVDd                                       29 mm                    7-23 mm   IVSD                                       10 mm                    7-11 mm   LVIDd                                      38 mm                   37-54 mm   LVPWd                                       8 mm                    7-11 mm   LVIDs  29 mm   Aortic Root Diameter                       29 mm                   20-37 mm   ACS                                        -- mm                   16-26 mm   Left Atrial Diameter                       33 mm                   19-40 mm   2-Dimensional and Doppler Findings:   INDICATION FOR STUDY:  Hypotension with dizziness.   TESTS PERFORMED:  M-Mode, 2-D echocardiography; color flow and quantitative   spectral Doppler echocardiography; tissue Doppler echocardiography.   FINDINGS:   1. Technically fair to good quality study.   2. There is normal left ventricular chamber size with top normal wall      thickness and low normal systolic function, estimated ejection fraction      between 50%-55%.  Type 1 diastolic dysfunction is suggested by      transmitral Doppler and tissue Doppler.   3. Top normal left atrial size.   4. Normal right ventricular chamber size, wall thickness and systolic      function.   5. Normal right  atrial size.   6. The aortic valve is trileaflet, displays mild sclerotic thickening, but      normal cusp separation which excludes stenosis or regurgitation by      Doppler.   7. The mitral valve leaflets display normal structure and motion with no      evidence for stenosis or prolapse.  There is minimal to mild mitral      regurgitation.   8. Pulmonic valve displays normal structure and motion with no evidence for      stenosis or regurgitation.   9. The tricuspid valve displays normal structure and motion with no      evidence for stenosis or prolapse.  There is moderate tricuspid      regurgitation.  Moderate right ventricular and pulmonary artery      hypertension with systolic pressure estimated between 52 and 57 mmHg is      based on tricuspid regurgitant jet velocity of 3.2 m/sec.   10. No intracardiac mass to suggest thrombus, tumor or vegetation.   11. No pericardial effusion.   12. Visualized portion of the aortic root displays normal diameter.   CONCLUSIONS:   1. Normal left ventricular size with top normal size thickness and low      normal systolic function, ejection fraction of 50%-55%.  Type 1      diastolic dysfunction.   2. Top normal right ventricular chamber size and systolic function.   3. Top normal left atrial size with normal right atrial size.   4. Mild sclerotic changes of the aortic valve without stenosis or      regurgitation.  Mild mitral regurgitation and moderate tricuspid      regurgitation with structurally normal valves.   5. Moderate pulmonary artery hypertension with systolic  pressure estimated      between 52 and 57 mmHg.   6. Since prior study of 04-01-06 there has been significant interval      improvement in left ventricular systolic function, with improved mitral      regurgitation and pulmonic regurgitation with unchanged pulmonary artery      hypertension and right heart appearance.   Electronically Signed By:   Donnel Saxon, M.D. 01/10/2007 17:35    __________________________________________________   Barbara Cower. MILLER, M.D.   M-Modes Only Dictated by Gaylyn Lambert, [facility]   MD  D: 01/07/2007  T: 01/07/2007 10:44 A    [id number]/   cc:   Billey Chang. MARKS, M.D.         Ramon Dredge C. MILLER, M.D.

## 2007-01-08 NOTE — Discharge Summary (Signed)
[facility]                                DISCHARGE SUMMARY   NAME:  Kathryn [name], Kathryn [name]   MR #:  [id number]                      ADM DATE:   01/05/2007   BILLIN [id number]                     DIS DATE:     01/08/2007   G#   SS #   [id number]                   DOB:  05-14-1940   Billey Chang. MARKS, M.D.               AGE:66                   SEX:  F   cc:    CHARLES W. MARKS, M.D.   FINAL DIAGNOSES:   1.     Hypotension, corrected, secondary to the diuretics.   2.     She also had shortness of breath due to congestive heart failure.   3.     Acute renal failure, stable.   4.     Abnormal alkaline phosphatase.   5.     Muscle cramps.   6.     History of chest pain.   7.     Nonischemic cardiomyopathy with 20% ejection fraction in the past.   8.     Mitral valve disease.   9.     History of congestive heart failure.   10.    Depression.   11.    Gastroesophageal reflux disease.   12.    History of breast cancer, status post mastectomy.   13.    Dyslipidemia.   14.    Reactive airway disease.   15.    Asthma.   The patient was followed by cardiology.  Now, her ejection fraction is 50%   to 55% and it was 20% so her cardiomyopathy has gotten better.  She was   started on the Coreg and will continue the same and restart her Lasix at 20   mg a day and Aldactone 12.5 daily.  She is to follow up with the [facility] office for her congestive heart failure.  No Zaroxolyn, as the patient   was probably dehydrated at the time of admission.   DISCHARGE MEDICATIONS:  Her medicines at the time of discharge include the   following:   1.     Allopurinol 100 mg a day for gout.   2.     Aspirin 325 mg daily.   3.     Coreg 12.5 b.i.d.   4.     Colace 100 b.i.d.   5.     Aricept 10 mg daily for memory loss.   6.     She also has Lexapro 10 mg daily for depression.   7.     Prozac 20 mg b.i.d. for gastroesophageal reflux disease.   8.     Zocor 20 mg daily for hyperlipidemia.    9.       Aldactone 12.5 for failure.  That should be on a daily basis.   10.    Tylenol 650 q.4 hour p.r.n.  for pain.   11.    Mylanta for heartburn.   12.    Milk of magnesia for constipation.   13.    Ambien 5 every night p.r.n. for sleep.   HISTORY OF PRESENT ILLNESS:  This 66 year old female came in with muscle   cramps, shortness of breath, and hypotension.  They show blood pressure in   the emergency room running 89/50 and pulse 65.  Her BUN was 43 and   creatinine 2.2.  This improved markedly.  She was given fluids.  Past   history, as we said, was nonischemic cardiomyopathy with an improved   ejection fraction.  She had a negative cardiac catheter earlier this year.   She had some nausea, vomiting, and diarrhea recently and that contributed   to dehydration.  We will continue her on medications and blood pressure at   the time of discharge was 115/80.   PAST MEDICAL HISTORY:   1.     Hypertension.   2.     Malignancy of the breast, operated in 1992.   3.     History of psychiatric anxiety and depression.   4.     Gastroesophageal reflux.   5.     Dyslipidemia.   6.     Reactive airway disease.   SURGICAL HISTORY:  Past history includes:   1.     Hysterectomy in 2004.   8.     Mastectomy in 1992.   9.     Tonsillectomy in the remote past.   FAMILY HISTORY:  Family history is positive for hypertension, diabetes, and   asthma.   SOCIAL HISTORY:  She denies any alcohol or drug use.  She lives with   friends and family.   She had an episode of chest pain earlier this year, which was worked up as   noncardiac.   REVIEW OF SYSTEMS:  Essentially unremarkable, except for the hypotension,   nausea and vomiting.   PHYSICAL EXAMINATION:   GENERAL:  She is a fairly well-developed, well-nourished female.   VITAL SIGNS:  Vital signs are stable.   LUNGS:  Clear.   HEART:  Regular rate and rhythm without murmur, gallop, rub, or click.   ABDOMEN:  Soft, no organomegaly, tenderness, guarding, or rebound.    EXTREMITIES:  No clubbing, cyanosis, or edema.   NEUROLOGIC:  She is oriented to person, place, and time.   LABORATORY DATA:  Electrolytes are stable.  Creatinine had come down from   4.3 to 2.2 and was 1.4 at the time of discharge.  Will continue her other   current medications.  Follow up as noted above.   Electronically Signed By:   Billey Chang. MARKS, M.D. 01/20/2007 04:41   ____________________________   Billey Chang. MARKS, M.D.   TS1  D:  01/18/2007  T:  01/19/2007 10:43 A   [id number]

## 2007-09-28 NOTE — ED Provider Notes (Signed)
[facility]                      EMERGENCY DEPARTMENT TREATMENT REPORT   NAME:  Kathryn [name]         PT. LOCATION:      ER  [phone]          DOB:                                                                         AGE:   MR #:      BILLING #:           DOA:  09/28/2007   DOD:              SEX:  F   61-50-31   [id number]   cc:    Donnel Saxon, M.D.          Juanetta Gosling, M.D.   Primary Physician:  Barnabas Lister, M.D.   TIME OF EVALUATION   [phone].   CHIEF COMPLAINT   Dizziness, weakness.   HISTORY OF PRESENT ILLNESS   The patient is a very pleasant 67 year old female.  She has had dizziness,   some shortness of breath and some weakness over the last month.  She states   it has been ever since there has been a change in her hypertensive   medication.  She had an appointment at her cardiologist's office this   morning and her cardiologist felt it was important for her to be evaluated   at the ER due to the symptoms.  Denies fever, recent illness, chest pain or   other complaints.   REVIEW OF SYSTEMS   CONSTITUTIONAL:  No fever, chills, weight loss.   ENT: No sore throat, runny nose or other URI symptoms.   ENDOCRINE:  No diabetic symptoms.   HEMATOLOGIC/LYMPHATIC:  No excessive bruising or lymph node swelling.   RESPIRATORY:  As per HPI.   CARDIOVASCULAR:  No chest pain, chest pressure, or palpitations.   GASTROINTESTINAL:  No vomiting, diarrhea, or abdominal pain.   INTEGUMENTARY:  No rashes.   NEUROLOGICAL: As per HPI.   PAST MEDICAL HISTORY   Significant for cardiac disease including MI (myocardial infarction),   coronary artery disease, CHF (congestive heart failure), hypertension,   reflux, breast cancer.   SURGICAL HISTORY   Mastectomy, chemotherapy, tonsillectomy, hysterectomy.   PSYCHIATRIC HISTORY   Anxiety, depression.   SOCIAL HISTORY   Denies alcohol, tobacco and drug use.  Lives with others.   FAMILY HISTORY   Includes hypertension, diabetes and asthma.   ALLERGIES    Penicillin and sulfa.   CURRENT MEDICATIONS   Listed in [place], please review.   PHYSICAL EXAMINATION   VITAL SIGNS:  Blood pressure 126/57, respirations 20, O2 saturations 99% on   room air, pulse 78, temperature 96.8, pain 0.   GENERAL APPEARANCE:  The patient appears well developed and well nourished.   Appearance and behavior are age and situation appropriate.   HEENT:  Eyes:  Conjunctivae clear, lids normal.  Pupils equal, symmetrical,   and normally reactive.   RESPIRATORY:  Clear and equal breath sounds.  No respiratory distress,   tachypnea, or accessory muscle use.  CARDIOVASCULAR:  Heart is regular without murmurs or rubs.   CHEST:  Chest symmetrical without masses or tenderness.   GASTROINTESTINAL:  Abdomen is obese, soft, nondistended, nontender to   palpation.   SKIN:  Warm and dry without rashes.   NEUROLOGIC:  Alert, oriented.  Sensation intact, motor strength equal and   symmetric.   COURSE IN THE EMERGENCY ROOM   No change.   DIAGNOSTIC STUDIES   Included EKG read by Dr. Wilfrid Lund.  I see no acute S-T segment or T-wave   abnormalities that are consistent with acute ischemia or infarction.  Chest   x-ray read by radiology as no acute cardiopulmonary process, unchanged   since January 05, 2007.  Troponin I was within normal limits.  Basic   Metabolic Panel within normal limits.  CPK within normal limits.  Serum   myoglobin within normal limits.  CBC unremarkable.   Dr. Wilfrid Lund spoke to Dr. Cleon Gustin on the phone.  The plan is for the   patient to follow up with Dr. Cleon Gustin in his office.   DIAGNOSIS   Evaluation of dizziness and dyspnea.   DISPOSITION   The patient was discharged to home with the instructions to follow up with   Dr. Valinda Hoar office.  To return to the ER for new or worsening symptoms or   any concerns.  The patient is discharged with verbal and written   instructions and a referral for ongoing care.  The patient is aware that   they may return at any time for new or worsening symptoms.    Electronically Signed By:   Erlinda Hong, M.D. 09/29/2007 18:44   ____________________________   TODD Wilfrid Lund, M.D.   Dictated by:  Debbora Presto, PA-C   My signature above authenticates this document and my orders, the final   diagnosis(es), discharge prescription(s) and instructions in the [facility] record.   SB1  D:  09/28/2007  T:  09/29/2007  9:04 A   [id number]

## 2007-09-28 NOTE — Procedures (Signed)
Test Reason : Dyspnea, dizziness   Blood Pressure : ***/*** mmHG   Vent. Rate : 077 BPM     Atrial Rate : 077 BPM   P-R Int : 142 ms          QRS Dur : 140 ms   QT Int : 430 ms       P-R-T Axes : 068 017 061 degrees   QTc Int : 486 ms   Normal sinus rhythm   Possible Left atrial enlargement   Left bundle branch block   Abnormal ECG   When compared with ECG of 06-Jan-2007 20:34,   LBBB is new   Confirmed by Menees, M.D., Daniel ([id number]) on 09/28/2007 5:48:53 PM   Referred By: Todd Vanden Hoek, MD           Overread By: Daniel Menees, M.D.

## 2007-09-28 NOTE — Procedures (Signed)
Test Reason : Dyspnea, dizziness   Blood Pressure : ***/*** mmHG   Vent. Rate : 077 BPM     Atrial Rate : 077 BPM   P-R Int : 142 ms          QRS Dur : 140 ms   QT Int : 430 ms       P-R-T Axes : 068 017 061 degrees   QTc Int : 486 ms   Normal sinus rhythm   Possible Left atrial enlargement   Left bundle branch block   Abnormal ECG   When compared with ECG of 06-Jan-2007 20:34,   LBBB is new   Confirmed by Robert Bellow, M.D., Daniel [id number]) on 09/28/2007 5:48:53 PM   Referred By: Erlinda Hong, MD           Overread By: Sydnee Cabal, M.D.

## 2011-07-03 ENCOUNTER — Inpatient Hospital Stay: Payer: MEDICARE

## 2011-07-03 LAB — GLUCOSE, POC: Glucose (POC): 109 mg/dL (ref 70–110)

## 2011-07-03 MED ORDER — NALOXONE 0.4 MG/ML INJECTION
0.4 mg/mL | INTRAMUSCULAR | Status: DC | PRN
Start: 2011-07-03 — End: 2011-07-03

## 2011-07-03 MED ORDER — BENZOCAINE 20 % MUCOSAL AEROSOL SPRAY
20 % | Status: DC | PRN
Start: 2011-07-03 — End: 2011-07-03

## 2011-07-03 MED ORDER — FENTANYL CITRATE (PF) 50 MCG/ML IJ SOLN
50 mcg/mL | INTRAMUSCULAR | Status: DC | PRN
Start: 2011-07-03 — End: 2011-07-03
  Administered 2011-07-03: 16:00:00 via INTRAVENOUS

## 2011-07-03 MED ORDER — FLUMAZENIL 0.1 MG/ML IV SOLN
0.1 mg/mL | INTRAVENOUS | Status: DC | PRN
Start: 2011-07-03 — End: 2011-07-03

## 2011-07-03 MED ORDER — MIDAZOLAM 1 MG/ML IJ SOLN
1 mg/mL | INTRAMUSCULAR | Status: DC | PRN
Start: 2011-07-03 — End: 2011-07-03
  Administered 2011-07-03: 16:00:00 via INTRAVENOUS

## 2011-07-03 MED ADMIN — 0.9% sodium chloride infusion: INTRAVENOUS | @ 15:00:00 | NDC 00409798309

## 2011-07-03 MED FILL — FENTANYL CITRATE (PF) 50 MCG/ML IJ SOLN: 50 mcg/mL | INTRAMUSCULAR | Qty: 2

## 2011-07-03 MED FILL — SIMETHICONE 40 MG/0.6 ML ORAL DROPS, SUSP: 40 mg/0.6 mL | ORAL | Qty: 30

## 2011-07-03 MED FILL — MIDAZOLAM 1 MG/ML IJ SOLN: 1 mg/mL | INTRAMUSCULAR | Qty: 6

## 2011-07-03 MED FILL — SODIUM CHLORIDE 0.9 % IV: INTRAVENOUS | Qty: 1000

## 2011-07-03 NOTE — Other (Signed)
Up to br to pass flatus

## 2011-10-29 NOTE — Progress Notes (Signed)
PT DAILY TREATMENT     Patient Name: Kathryn [name]  Date:10/29/2011  DOB: 1940/06/28  [x]   Patient DOB Verified  Payor: HUMANA MEDICARE  Plan: [employer] MEDICARE CHOICE PPO/PFFS  Product Type: Managed Care Medicare     In time: 3:05 Out time:4:00  Total Treatment Time (min): 55  Total Timed Codes (min): 8  1:1 Treatment Time (MC only): 55   Visit #: 1 of 9    Treatment Area: Back pain [724.5]  Bilateral leg pain [729.5]    SUBJECTIVE  Pain Level (0-10 scale): 5/10  Any medication changes, allergies to medications, adverse drug reactions, diagnosis change, or new procedure performed?: []  No    []  Yes (see summary sheet for update)  Subjective functional status/changes:   []  No changes reported  CC: "I have pain all over and I don't do much." HPI: Patient reports initial onset of LBP and B leg pain 2 weeks ago with no specific MOI. Patient went to physician because of increased pain and received x-rays of lumbar spine, but she does not know results. She began to notice increased pain before bed and during prolonged sitting activities. Patient reports that L side is worse than the R and pain is in lumbar spine extending into buttocks. Before 2 weeks ago, patient reports that occasionally she felt weak in LE and would feel pain in legs when laying supine, however less intense compared present intensity. CLOF: Able to stand 10 minutes before pain increases, unable to tolerate sitting for prolonged periods of time, increased pain donning/doffing shoes, requires A for entering/exiting tub, grocery shops by leaning on cart. PLOF: Able to stand for >10 minutes, able to walk daily, I ADLs and IADLs.    Physical Therapy Evaluation - Lumbar Spine ([facility])    Symptoms:  Aggravated by:   [x]  Bending [x]  Sitting [x]  Standing [x]  Walking   []  Moving []  Cough []  Sneeze []  Valsalva   []  AM  []  PM  Lying:  [x]  sup   []  pro   []  sidelying   []  Other:     Eased by:    []  Bending []  Sitting []  Standing []  Walking   []  Moving []   AM  []  PM  Lying: []  sup  []  pro  [x]  sidelying   []  Other:     General Health:  Red Flags Indicated? []  Yes    [x]  No  []  Yes []  No Recent weight change (If yes, due to dieting? []  Yes  []  No)   []  Yes []  No Weakness in legs during walking  []  Yes []  No Unremitting pain at night  []  Yes []  No Abdominal pain or problems  []  Yes []  No Rectal bleeding  []  Yes []  No Feet more cold or painful in cold weather  []  Yes []  No Menstrual irregularities  []  Yes []  No Blood or pain with urination  []  Yes []  No Dysfunction of bowel or bladder  []  Yes []  No Recent illness within past 3 weeks (i.e, cold, flu)  []  Yes []  No Numbness/tingling in buttock/genitalia region    Diagnostic Tests: []  Lab work [x]  X-rays    []  CT []  MRI     []  Other:  Results: Awaiting results    OBJECTIVE  Posture:  Lateral Shift: []  R    []  L     []  +  []  -  Kyphosis: []  Increased []  Decreased   []   WNL  Lordosis:  [x]  Increased lumbar []   Decreased   []  WNL  Pelvic symmetry: []  WNL    [x]  Other: R upslip    Gait:  []  Normal     [x]  Abnormal: B hip ER R>L, decreased trunk rotation, R>L trendelenberg, decreased step length B    Active Movements: []  N/A   []  Too acute   []  Other:  ROM  AROM % PROM Comments:pain, area   Forward flexion 40-60 30 degrees  Increased pain   Extension 20-30 10 degrees  Increased pain   SB right 20-30 10 degrees  Increased pain R hip/gluteal area   SB left 20-30 15 degrees  No pain   Rotation right 5-10 25 degrees     Rotation left 5-10 25 degrees  Increased pain in L gluteal area     Dural Mobility:  SLR Sitting: [x]  R    [x]  L    []  +    [x]  -  @ (degrees):           Supine: [x]  R    [x]  L    []  +    [x]  -  @ (degrees):   Slump Test: [x]  R    [x]  L    []  +    [x]  -  @ (degrees):   Prone Knee Bend: []  R    []  L    []  +    []  -     Palpation  []  Min  []  Mod  [x]  Severe    Location: B thoracic/lumbar paraspinals, B glutes and hip ER      Strength   L(0-5) R (0-5) N/T   Hip Flexion (L1,2) 3 3 []    Knee Extension (L3,4) 3+ 4+ []     Ankle Dorsiflexion (L4)   []    Great Toe Extension (L5)   []    Ankle Plantarflexion (S1)   []    Knee Flexion (S1,2) 3+ 4 []    Upper Abdominals   []    Lower Abdominals   []    Paraspinals   []    Back Rotators   []    Gluteus Maximus   []    Other   []    Hip add/abd: L 3+/5, R 3+/5    Special Tests  Lumbar:  Lumb. Compression: []  Pos  []  Neg               Lumbar Distraction:   []  Pos  []  Neg    Quadrant:  []  Pos  []  Neg   []  Flex  []  Ext    Sacroilliac:  Gaenslen's: []  R    []  L    []  +    []  -     Compression: [x]  +    []  -     Gapping:  []  +    []  -     Thigh Thrust: []  R    []  L    []  +    []  -     Leg Length: [x]  +    []  -   Position: standing R iliac crest elevated compared to L, supine R iliac crest elevated compared to left    Crests: R elevated compared to left in supine    ASIS:    PSIS:    Sacral Sulcus:    Mobility: Standing flex:     Sitting flex:     Supine to sit:     Prone knee bend:         Hip: Pearlean Brownie:  [x]  R    [x]  L    [  x] +    []  -     Scour:  []  R    []  L    []  +    []  -     Piriformis: [x]  R    [x]  L    [x]  +    []  -          Deficits: Ober's: [x]  R    [x]  L    [x]  +    []  -     Thomas: [x]  R    [x]  L    [x]  +    []  -     Hamstrings 90/90: L 136 degrees, R 140 degrees    Gastrocsoleus (to neutral): Right: Left:    + ELy test B     Other tests/comments:  No pain IR/ER PROM L hip, no pain IR ER R hip  Hip extension AROM/PROM:   -5 degrees for B hip extension AROM, PROM to neutral B       OBJECTIVE  Therapeutic Exercise: [x]  see flow sheet     []  Other:_ (minutes) :8  Added/Changed Exercises:  []   Added:_  to improve (function):  []   Changed:_ to improve (function):    Patient Education: [x]  Review HEP    []  Progressed/Changed HEP based on:_   [x]  positioning   [x]  body mechanics   [x]  transfers   [x]  heat/ice application   (minutes) :    Pain Level (0-10 scale) post treatment: 5/10    ASSESSMENT  [x]   See Plan of Care    PLAN  []   Upgrade activities as tolerated     [x]   Continue plan of care  []    Discharge due to:_  []  Other:_      Donnita Falls, PT 10/29/2011  3:07 PM

## 2011-10-29 NOTE — Progress Notes (Signed)
[facility] ??? [facility]  [address]  [place], [place] [address]  [phone]  [phone] fax    Plan of Care/ Statement of Necessity for Physical Therapy Services    Patient name: Kathryn [name]      Start of Care: 10/29/2011   Referral source: Sena Slate, MD      DOB: Mar 12, 1941   Diagnosis: Back pain [724.5]  Bilateral leg pain [729.5]       Onset Date:July 2013   Prior Hospitalization:see medical history    Provider#: [id number]  Comorbidities: Heart Disease, DM, Depression, Pacemaker, HTN, CA 20 years ago  Prior Level of Function:Able to stand for >10 minutes, able to walk for daily exercise, I ADLs and IADLs.    The Plan of Care and following information is based on the information from the initial evaluation.  Assessment/ key information: 71 year old female referred to outpatient physical therapy for LBP and B leg pain demonstrates decreased lumbar AROM, decreased core integration/strength during transfers and bed mobility, pelvic asymmetry, decreased LE strength, decreased LE flexibility (+90-90 SLR, FABER B for iliopsoas tightness, Ely test B, Thomas test B, and Ober test B), in 5 degree hip flexor contracture in standing. Patient would benefit from skilled physical therapy to address the above deficits in order to increase independence with ADLs and decrease LBP.     Problem List: pain affecting function, decrease ROM, decrease strength, edema affecting function, impaired gait/ balance, decrease ADL/ functional abilitiies, decrease activity tolerance, decrease flexibility/ joint mobility and decrease transfer abilities     Treatment Plan may include any combination of the following: Therapeutic exercise, Therapeutic activities, Neuromuscular re-education, Physical agent/modality, Gait/balance training, Manual therapy and Patient education    Patient / Family readiness to learn indicated by: asking questions, trying to perform skills and interest    Persons(s) to be included  in education: patient (P)    Barriers to Learning/Limitations: no    Patient Goal (s): ???I hope there will be less pain???    Rehabilitation Potential: good    Short Term Goals: To be accomplished in 2 weeks:  1. Patient will report compliance with HEP at least 1x per day in order to decrease LBP.   2. Patient will ambulate on TM for 5 minutes continuously with <5/10 LBP in order to return to daily walking program.     Long Term Goals: To be accomplished in 3 weeks:  1. Patient will demonstrate increase in LEFS by at least 10 points in order to stand for >10 minutes.   2. Patient will demonstrate 14% improvement in revised OSW in order to grocery shop without leaning on cart.   3. Patient will demonstrate WNL lumbar AROM all directions in order to decrease B hip flexor contracture.      Frequency / Duration: Patient to be seen 3 times per week for 3 weeks.    Patient/ Caregiver education and instruction: self care, activity modification and exercises    G-Codes (GP)  Mobility  [id number] Current  CK= 40-59%  [id number] Goal  CJ= 20-39%      The severity rating is based on the Oswestry score.    Certification Period: 10/29/2011-12/28/2011    Donnita Falls, PT 10/29/2011 5:29 PM    ________________________________________________________________________    I certify that the above Therapy Services are being furnished while the patient is under my care. I agree with the treatment plan and certify that this therapy is necessary.  72 Signature:____________________  Date:____________Time: _________    Please sign and return to [facility] ??? [facility] [place] [address]  [phone]  [phone] fax

## 2011-11-04 NOTE — Progress Notes (Signed)
PT DAILY TREATMENT NOTE     Patient Name: Kathryn [name]  Date:11/04/2011  DOB: [birth year]  [x]   Patient DOB Verified  Payor: HUMANA MEDICARE  Plan: [employer] HU[employer] MEDICARE CHOICE PPO/PFFS  Product Type: Managed Care Medicare     In time:3:30  Out time:4:00  Total Treatment Time (min): 30  Total Timed Codes (min): 30  1:1 Treatment Time (MC only): 30   Visit #: 2 of 9    Treatment Area: Back pain [724.5]  Bilateral leg pain [729.5]    SUBJECTIVE  Pain Level (0-10 scale): 6/10  Any medication changes, allergies to medications, adverse drug reactions, diagnosis change, or new procedure performed?: [x]  No    []  Yes (see summary sheet for update)  Subjective functional status/changes:   []  No changes reported  "I'm doing all right"    OBJECTIVE  Modality rationale:      min []  Estim, type:                                          []   att     []   unatt     []   w/US     []   w/ice    []   w/heat    min []   Mechanical Traction: type/lbs                                               []   pro   []   sup   []   int   []   cont    min []   Ultrasound, settings/location:      min []   Iontophoresis:  []   take home patch w/ dexamethazone    min                                []   in clinic w/ dexamethazone    min []   Ice     []   Heat     position:     min []   Other:      Therapeutic Exercise: [x]  see flow sheet     []  Other:_ (minutes) :30  Added/Changed Exercises:  []   Added:_  to improve (function):  []   Changed:_ to improve (function):    Patient Education: [x]  Review HEP    []  Progressed/Changed HEP based on:_   []  positioning   []  body mechanics   []  transfers   []  heat/ice application   (minutes) :    Other Objective/Functional Measures:  Initiated ex program  Reviewed HEP for understanding  Reports pain R>L     Pain Level (0-10 scale) post treatment: 0/10    ASSESSMENT  []   See Plan of Care  []   See progress note/recertification  [x]   Patient will continue to benefit from skilled therapy to address remaining functional deficits:  decreased core strength and stability, flexibility, decreased activity tolerance,     Progress towards goals / Updated goals:  STG # 1 :pt reports compliance with HEP    PLAN  [x]   Upgrade activities as tolerated     [x]   Continue plan of care  []   Discharge due to:_  []  Other:_      Andrena Mews, PTA  11/04/2011  3:34 PM

## 2011-11-06 NOTE — Progress Notes (Signed)
PT DAILY TREATMENT NOTE     Patient Name: EZME DUCH  Date:11/06/2011  DOB: 03/06/1941  [x]   Patient DOB Verified  Payor: HUMANA MEDICARE  Plan: [employer] HU[employer] MEDICARE CHOICE PPO/PFFS  Product Type: Managed Care Medicare     In time:1:30  Out time:2:10  Total Treatment Time (min): 40  Total Timed Codes (min): 40  1:1 Treatment Time (MC only): 30   Visit #: 3 of 9    Treatment Area: Back pain [724.5]  Bilateral leg pain [729.5]    SUBJECTIVE  Pain Level (0-10 scale): 5/10  Any medication changes, allergies to medications, adverse drug reactions, diagnosis change, or new procedure performed?: [x]  No    []  Yes (see summary sheet for update)  Subjective functional status/changes:   []  No changes reported  Patient reported no pain after last session and said that moving helped. Patient states that she has moments with no pain, and pain is getting better with exercises.     OBJECTIVE  Therapeutic Exercise: [x]  see flow sheet     []  Other:_ (minutes) :40  Added/Changed Exercises:  []   Added:_  to improve (function):  []   Changed:_ to improve (function):    Patient Education: [x]  Review HEP    []  Progressed/Changed HEP based on:_   []  positioning   []  body mechanics   []  transfers   []  heat/ice application   (minutes) :    Other Objective/Functional Measures:   Patient demonstrated difficulty maintaining balance during standing marches without UE support. She performed all exercises today without complaint of increased pain, but required frequent questioning in order to describe location of stretch. She also required frequent repetition of instructions, and demonstrated difficulty following multiple step commands. Patient reported that she had difficulty making payments for therapy sessions. Recommended patient to continue for 1-3 more sessions and then to be discharged home with updated HEP including all therapeutic exercises performed in clinic.     Pain Level (0-10 scale) post treatment: 0/10    ASSESSMENT  [x]   See  Plan of Care  []   See progress note/recertification  [x]   Patient will continue to benefit from skilled therapy to address remaining functional deficits: decreased lumbar AROM, decreased LE flexibility, decrease core strength/integration, decreased LE strength pelvic asymmetry    Progress towards goals / Updated goals:  LTG# 1 : Progressing towards improved function as patient has decreased pain with therapeutic exercise.     PLAN  []   Upgrade activities as tolerated     [x]   Continue plan of care  []   Discharge due to:_  []  Other:_      Donnita Falls, PT 11/06/2011  1:18 PM

## 2011-11-11 NOTE — Progress Notes (Signed)
PT DAILY TREATMENT NOTE     Patient Name: Kathryn [name]  Date:11/11/2011  DOB: 1940-05-27  [x]   Patient DOB Verified  Payor: HUMANA MEDICARE  Plan: [employer] HU[employer] MEDICARE CHOICE PPO/PFFS  Product Type: Managed Care Medicare     In time:2:30  Out time:3:10  Total Treatment Time (min): 40  Total Timed Codes (min): 40  1:1 Treatment Time (MC only): 30  Visit #: 4 of 9    Treatment Area: Back pain [724.5]  Bilateral leg pain [729.5]    SUBJECTIVE  Pain Level (0-10 scale): 0/10  Any medication changes, allergies to medications, adverse drug reactions, diagnosis change, or new procedure performed?: [x]  No    []  Yes (see summary sheet for update)  Subjective functional status/changes:   []  No changes reported  "I was good over the weekend, no pain."    OBJECTIVE  Therapeutic Exercise: [x]  see flow sheet     []  Other:_ (minutes) :40  Added/Changed Exercises:  [x]   Added:stability ball bridges, prone on elbows press ups  to improve (function): core integration/strength; hip extension  []   Changed:_ to improve (function):    Patient Education: [x]  Review HEP    []  Progressed/Changed HEP based on:_   []  positioning   []  body mechanics   []  transfers   []  heat/ice application   (minutes) :    Other Objective/Functional Measures:   Patient continues to demonstrate difficulty with supine bridging exercise, and demonstrates decrease hip AROM.   Patient requires min VC for integration core musculature during supine clams, ball squeeze, and bridging exercise.   Patient demonstrates difficulty remembering exercises from one side to the next and needs frequent VC for technique.  Progressed 3 way hip with thera-band to increase thera band resistance.     Pain Level (0-10 scale) post treatment: 0/10    ASSESSMENT  [x]   See Plan of Care  []   See progress note/recertification  [x]   Patient will continue to benefit from skilled therapy to address remaining functional deficits: decreased lumbar AROM, decreased LE flexibility, decrease core  strength/integration, decreased LE strength pelvic asymmetry    Progress towards goals / Updated goals:  STG # 2 : Progressing towards decrease pain level thus patient to perform TM walking next visit.     PLAN  []   Upgrade activities as tolerated     [x]   Continue plan of care  []   Discharge due to:_  [x]  Other:TM next visit      Donnita Falls, PT 11/11/2011  2:31 PM

## 2011-11-13 NOTE — Progress Notes (Addendum)
PT DAILY TREATMENT NOTE     Patient Name: Kathryn [name]  Date:11/13/2011  DOB: [birth year]  [x]   Patient DOB Verified  Payor: HUMANA MEDICARE  Plan: [facility] [employer] MEDICARE CHOICE PPO/PFFS  Product Type: Managed Care Medicare     In time:9:00  Out time:9:40  Total Treatment Time (min): 35  Visit #: 5 of 9    Treatment Area: Back pain [724.5]  Bilateral leg pain [729.5]    SUBJECTIVE  Pain Level (0-10 scale): 0/10  Any medication changes, allergies to medications, adverse drug reactions, diagnosis change, or new procedure performed?: [x]  No    []  Yes (see summary sheet for update)  Subjective functional status/changes:   []  No changes reported  "I'm not usually up this early."    OBJECTIVE  Therapeutic Exercise: [x]  see flow sheet     []  Other:_ (minutes) :35  Added/Changed Exercises:  []   Added:_  to improve (function):  []   Changed:_ to improve (function):    Patient Education: [x]  Review HEP    []  Progressed/Changed HEP based on:_   []  positioning   []  body mechanics   []  transfers   []  heat/ice application   (minutes) :    Other Objective/Functional Measures:   Patient ambulated on TM today at speed of 1.0 mph and B UE support; she required supervision with moderate verbal cueing for increased step length, decreased L foot drag, and increased B heel strike.   Added 1# weight to standing marches   Increased repetitions of 3 way hip exercise with green therband.   Patient had near fall while performing mini-squat exercise with no UE, thus she was permitted to use I UE support and educated to decrease knee flexion.   Patient complained of SOB and required seated rest break and was unable to perform remaining standing exercises due to increased fatigue.   Revised OSW: 16%    Pain Level (0-10 scale) post treatment: 1/10    ASSESSMENT  [x]   See Plan of Care  []   See progress note/recertification  [x]   Patient will continue to benefit from skilled therapy to address remaining functional deficits: decreased lumbar AROM,  decreased LE flexibility, decrease core strength/integration, decreased LE strength pelvic asymmetry    Progress towards goals / Updated goals:  STG # 2 : Progressing, patient ambulated on TM today but complained of some pain in R knee.      PLAN  []   Upgrade activities as tolerated     [x]   Continue plan of care  []   Discharge due to:_  []  Other:_      Donnita [place], PT 11/13/2011  9:01 AM

## 2014-04-17 ENCOUNTER — Emergency Department: Admit: 2014-04-17 | Payer: MEDICARE | Primary: Internal Medicine

## 2014-04-17 ENCOUNTER — Inpatient Hospital Stay
Admit: 2014-04-17 | Discharge: 2014-04-18 | Disposition: A | Discharge status: Home / Self Care | Payer: MEDICARE | Attending: Emergency Medicine

## 2014-04-17 DIAGNOSIS — R531 Weakness: Secondary | ICD-10-CM

## 2014-04-17 LAB — METABOLIC PANEL, BASIC
Anion gap: 9 mmol/L (ref 3.0–18)
BUN/Creatinine ratio: 17 (ref 12–20)
BUN: 22 MG/DL — ABNORMAL HIGH (ref 7.0–18)
CO2: 28 mmol/L (ref 21–32)
Calcium: 9.7 MG/DL (ref 8.5–10.1)
Chloride: 99 mmol/L — ABNORMAL LOW (ref 100–108)
Creatinine: 1.32 MG/DL — ABNORMAL HIGH (ref 0.6–1.3)
GFR est AA: 48 mL/min/{1.73_m2} — ABNORMAL LOW (ref >60)
GFR est non-AA: 39 mL/min/{1.73_m2} — ABNORMAL LOW (ref >60)
Glucose: 104 mg/dL — ABNORMAL HIGH (ref 74–99)
Potassium: 4.2 mmol/L (ref 3.5–5.5)
Sodium: 136 mmol/L (ref 136–145)

## 2014-04-17 LAB — CBC WITH AUTOMATED DIFF
ABS. BASOPHILS: 0.1 10*3/uL — ABNORMAL HIGH (ref 0.0–0.06)
ABS. EOSINOPHILS: 0.1 10*3/uL (ref 0.0–0.4)
ABS. LYMPHOCYTES: 1.3 10*3/uL (ref 0.9–3.6)
ABS. MONOCYTES: 0.5 10*3/uL (ref 0.05–1.2)
ABS. NEUTROPHILS: 1.8 10*3/uL (ref 1.8–8.0)
BASOPHILS: 2 % (ref 0–2)
EOSINOPHILS: 3 % (ref 0–5)
HCT: 36.5 % (ref 35.0–45.0)
HGB: 12.1 g/dL (ref 12.0–16.0)
LYMPHOCYTES: 34 % (ref 21–52)
MCH: 30 PG (ref 24.0–34.0)
MCHC: 33.2 g/dL (ref 31.0–37.0)
MCV: 90.6 FL (ref 74.0–97.0)
MONOCYTES: 12 % — ABNORMAL HIGH (ref 3–10)
MPV: 8.6 FL — ABNORMAL LOW (ref 9.2–11.8)
NEUTROPHILS: 49 % (ref 40–73)
PLATELET: 291 10*3/uL (ref 135–420)
RBC: 4.03 M/uL — ABNORMAL LOW (ref 4.20–5.30)
RDW: 12.8 % (ref 11.6–14.5)
WBC: 3.7 10*3/uL — ABNORMAL LOW (ref 4.6–13.2)

## 2014-04-17 LAB — TROPONIN I: Troponin-I, QT: <0.02 NG/ML (ref 0.00–0.06)

## 2014-04-17 LAB — GLUCOSE, POC: Glucose (POC): 110 mg/dL (ref 70–110)

## 2014-04-17 LAB — MAGNESIUM: Magnesium: 1.9 mg/dL (ref 1.8–2.4)

## 2014-04-17 NOTE — ED Notes (Signed)
Pt c/o dizziness on and off x 1 week

## 2014-04-17 NOTE — ED Provider Notes (Signed)
HPI Comments: Kathryn [name] is a 74 y.o. Female Hx Hypertension, Arrhythmia, CAD and Defibrillator, presents to the ED with her spouse and daughter C/O Intermittent Dizziness and SOB for the past week. Patient husband notes the Dizziness is worse when she stands for moves. Her husband states patient has also C/O Left Sided Neck Pain and Difficulty Swallowing for the past week. He notes patient had several episodes of Diarrhea on Saturday (April 15, 2014). However since arriving to the ER patient insist she's asymptomatic, states her family members-"made me come." Patient has a previously scheduled appointment with her primary care physician on Wednesday,(April 19, 2014). She denies Chest Pain, Fever, Abdominal Pain or any other complaints.     Patient is a 74 y.o. female presenting with dizziness and shortness of breath. The history is provided by the patient, the spouse and a relative.   Dizziness  Associated symptoms include shortness of breath. Pertinent negatives include no chest pain and no vomiting.   Shortness of Breath  Associated symptoms include sore throat. Pertinent negatives include no fever, no neck pain, no chest pain, no vomiting and no abdominal pain.        Past Medical History:   Diagnosis Date   ??? Hypertension    ??? Arrhythmia    ??? Heart failure ([place])    ??? CAD (coronary artery disease)    ??? Diabetes ([place])    ??? Arthritis    ??? GERD (gastroesophageal reflux disease)    ??? Cancer [facility])      Left breast       Past Surgical History:   Procedure Laterality Date   ??? Pr breast surgery procedure unlisted       Left Mastectomy   ??? Hx gyn       Hysterectomy   ??? Hx heent       Tosillectomy   ??? Hx pacemaker  2010         History reviewed. No pertinent family history.    History     Social History   ??? Marital Status: MARRIED     Spouse Name: N/A     Number of Children: N/A   ??? Years of Education: N/A     Occupational History   ??? Not on file.     Social History Main Topics    ??? Smoking status: Former Smoker     Quit date: 07/01/1967   ??? Smokeless tobacco: Not on file   ??? Alcohol Use: No   ??? Drug Use: No   ??? Sexual Activity: Not on file     Other Topics Concern   ??? Not on file     Social History Narrative           ALLERGIES: Pcn and Sulfa (sulfonamide antibiotics)      Review of Systems   Constitutional: Negative for fever.   HENT: Positive for sore throat and trouble swallowing.    Respiratory: Positive for shortness of breath.    Cardiovascular: Negative for chest pain.   Gastrointestinal: Negative for vomiting, abdominal pain and diarrhea.   Genitourinary: Negative for difficulty urinating.   Musculoskeletal: Negative for back pain and neck pain.   Skin: Negative for wound.   Neurological: Positive for dizziness. Negative for syncope.   Psychiatric/Behavioral: Negative for behavioral problems.   All other systems reviewed and are negative.      Filed Vitals:    04/17/14 1727   BP: 104/67   Pulse: 84   Temp: 97.5 ??  F (36.4 ??C)   Resp: 21   Height: 5\' 4"  (1.626 m)   Weight: 68.04 kg (150 lb)   SpO2: 99%            Physical Exam   Constitutional: She is oriented to person, place, and time. She appears well-developed and well-nourished. No distress.   HENT:   Head: Normocephalic and atraumatic.   Eyes: Conjunctivae and EOM are normal. Pupils are equal, round, and reactive to light.   Neck: Normal range of motion. Neck supple. No JVD present.   Cardiovascular: Normal rate and regular rhythm.  Exam reveals no friction rub.    No murmur heard.  Pulmonary/Chest: Effort normal and breath sounds normal.   CTA   Abdominal: Soft. Bowel sounds are normal. She exhibits no distension and no mass. There is no tenderness.   Musculoskeletal: Normal range of motion. She exhibits no edema or tenderness.   Neurological: She is alert and oriented to person, place, and time. She has normal reflexes. No cranial nerve deficit.   No motor or sensory deficits   Skin: Skin is warm and dry. No rash noted.    Psychiatric:   Flat Affect   Nursing note and vitals reviewed.       MDM  Number of Diagnoses or Management Options  Weakness:   Diagnosis management comments: PT says she's feeling better and wants to go home. Has a PCP appt in 48 hours and will keep that. Agrees to return to ED if sx change or worsen.  Chelsea Aus, MD  6:59 PM         Amount and/or Complexity of Data Reviewed  Clinical lab tests: ordered and reviewed  Tests in the radiology section of CPT??: ordered and reviewed        EKG    Date/Time: 04/17/2014 5:19 PM  Performed by: Chelsea Aus  Authorized by: Chelsea Aus  Interpreted by ED physician  Comparison: not compared with previous ECG   Rhythm: paced  Rhythm comments: 100%  BPM: 86  Comments: No Acute Changes.        Medications ordered:   Medications - No data to display      Lab findings:  Labs Reviewed   METABOLIC PANEL, BASIC - Abnormal; Notable for the following:     Chloride 99 (*)     Glucose 104 (*)     BUN 22 (*)     Creatinine 1.32 (*)     GFR est AA 48 (*)     GFR est non-AA 39 (*)     All other components within normal limits   CBC WITH AUTOMATED DIFF - Abnormal; Notable for the following:     WBC 3.7 (*)     RBC 4.03 (*)     MPV 8.6 (*)     MONOCYTES 12 (*)     ABS. BASOPHILS 0.1 (*)     All other components within normal limits   TROPONIN I   MAGNESIUM   URINALYSIS W/ RFLX MICROSCOPIC   GLUCOSE, POC   POC GLUCOSE       X-Ray, CT or other radiology findings or impressions:  XR CHEST AP LAT   ( Dr. Chelsea Aus, MD/Preliminary Result)   ~       Progress notes, Consult notes or additional Procedure notes:     Reevaluation of patient:   I have reevaluated patient. Patient is feeling     Dispo:  Patient was in  stable condition.  Patient is to return to emergency department with any new or worsening condition.        Scribe Attestation:   April 17, 2014 at 5:37 PM - Allie Dimmer scribing for and in the presence of Dr.Chi Garlow Kathy Breach, MD      I personally performed the services described in the documentation, reviewed the documentation recorded by the scribe in my presence and it accurately and completely records my words and actions. (Dr.Jeromey Kruer Kathy Breach, MD ) Provider Attestation:   I personally performed the services described in the documentation, reviewed the documentation, as recorded by the scribe in my presence, and it accurately and completely records my words and actions. April 17, 2014 at 6:59 PM - Chelsea Aus, MD

## 2014-04-17 NOTE — ED Notes (Signed)
Pt also having shortness of breath x 1 week

## 2014-04-17 NOTE — ED Notes (Signed)
I have reviewed discharge instructions with the patient.  The patient verbalized understanding.Patient armband removed and shredded  Current Discharge Medication List      CONTINUE these medications which have NOT CHANGED    Details   polyethylene glycol (MIRALAX) 17 gram packet Take 17 g by mouth daily.      losartan (COZAAR) 25 mg tablet Take 25 mg by mouth daily.      albuterol (PROVENTIL HFA, VENTOLIN HFA, PROAIR HFA) 90 mcg/actuation inhaler Take 2 Puffs by inhalation every four (4) hours as needed for Wheezing.      ALPRAZolam (XANAX) 0.5 mg tablet Take 0.5 mg by mouth nightly as needed for Anxiety.      traMADol (ULTRAM) 50 mg tablet Take 50 mg by mouth every six (6) hours as needed for Pain.      omeprazole (PRILOSEC) 20 mg capsule Take 20 mg by mouth daily.      furosemide (LASIX) 20 mg tablet Take 20 mg by mouth daily.      gabapentin (NEURONTIN) 300 mg tablet Take 300 mg by mouth daily.      metFORMIN (GLUCOPHAGE) 500 mg tablet Take 500 mg by mouth daily (with breakfast).      carvedilol (COREG) 3.125 mg tablet Take 3.125 mg by mouth two (2) times a day. 2TABS IN AM, 1 IN THE EVENING      citalopram (CELEXA) 20 mg tablet Take 20 mg by mouth.      spironolactone (ALDACTONE) 25 mg tablet Take 25 mg by mouth daily. 1/2 TAB DAILY      simvastatin (ZOCOR) 20 mg tablet Take 20 mg by mouth nightly.      Cholecalciferol, Vitamin D3, 5,000 unit Tab Take  by mouth daily.      aspirin 81 mg tablet Take 81 mg by mouth daily.

## 2014-04-17 NOTE — ED Notes (Signed)
Pt unable to provide urine sample, MD Wentzel aware.

## 2014-04-19 LAB — EKG, 12 LEAD, INITIAL
Atrial Rate: 86 {beats}/min
Calculated P Axis: 61 degrees
Calculated R Axis: -33 degrees
Calculated T Axis: 43 degrees
P-R Interval: 126 ms
Q-T Interval: 392 ms
QRS Duration: 120 ms
QTC Calculation (Bezet): 469 ms
Ventricular Rate: 86 {beats}/min

## 2014-05-11 ENCOUNTER — Encounter

## 2014-05-12 ENCOUNTER — Inpatient Hospital Stay: Admit: 2014-05-12 | Payer: MEDICARE | Attending: Internal Medicine | Primary: Internal Medicine

## 2014-05-12 DIAGNOSIS — I43 Cardiomyopathy in diseases classified elsewhere: Secondary | ICD-10-CM

## 2014-05-14 NOTE — Procedures (Signed)
[facility]  *** FINAL REPORT ***    Name: Kathryn [name], Kathryn [name]  MRN: [id number]    Outpatient  DOB: 08/26/1940  HIS Order #: [id number]  [place] Visit #: [id number]  Date: 12 May 2014    TYPE OF TEST: Cerebrovascular Duplex    REASON FOR TEST    Right Carotid:-             Proximal               Mid                 Distal  cm/s  Systolic  Diastolic  Systolic  Diastolic  Systolic  Diastolic  CCA:     94.8      30.0                            81.0      28.0  Bulb:  ECA:     66.0      13.0  ICA:     56.0      21.0       72.0      25.0       92.0      38.0  ICA/CCA:  1.0       1.3    ICA Stenosis: Normal    Right Vertebral:-  Finding: Antegrade  Sys:       49.0  Dia:       21.0    Right Subclavian: Normal    Left Carotid:-            Proximal                Mid                 Distal  cm/s  Systolic  Diastolic  Systolic  Diastolic  Systolic  Diastolic  CCA:    546.2      31.0                            66.0      18.0  Bulb:  ECA:     72.0      18.0  ICA:     55.0      20.0       76.0      34.0       73.0      30.0  ICA/CCA:  0.6       1.1    ICA Stenosis: Normal    Left Vertebral:-  Finding: Antegrade  Sys:       95.0  Dia:       37.0    Left Subclavian: Normal    INTERPRETATION/FINDINGS  1. No evidence of significant arterial occlusive disease in the  internal carotid arteries without any evidence of visible plaque.  2. 1- 49 % of stenosis in the external carotid arteries bilaterally.  3. Antegrade flow in both vertebral arteries.  4. Normal flow in both subclavian arteries.    ADDITIONAL COMMENTS  Hyperechoic plaque noted between the bulb and ECA bilaterally.    I have personally reviewed the data relevant to the interpretation of  this  study.    TECHNOLOGIST: Patsey Berthold, [facility], RVT/  Signed: 05/12/2014 02:17 PM    PHYSICIAN: Mali Addalie Calles, D.O.  Signed: 05/14/2014 03:09 PM

## 2014-05-14 NOTE — Procedures (Signed)
[facility]  *** FINAL REPORT ***    Name: Kathryn [name], Kathryn [name]  MRN: [id number]    Outpatient  DOB: 1940/04/30  HIS Order #: [id number]  [place] Visit #: [id number]  Date: 12 May 2014    TYPE OF TEST: Cerebrovascular Duplex    REASON FOR TEST    Right Carotid:-             Proximal               Mid                 Distal  cm/s  Systolic  Diastolic  Systolic  Diastolic  Systolic  Diastolic  CCA:     38.1      30.0                            81.0      28.0  Bulb:  ECA:     66.0      13.0  ICA:     56.0      21.0       72.0      25.0       92.0      38.0  ICA/CCA:  1.0       1.3    ICA Stenosis: Normal    Right Vertebral:-  Finding: Antegrade  Sys:       49.0  Dia:       21.0    Right Subclavian: Normal    Left Carotid:-            Proximal                Mid                 Distal  cm/s  Systolic  Diastolic  Systolic  Diastolic  Systolic  Diastolic  CCA:    829.9      31.0                            66.0      18.0  Bulb:  ECA:     72.0      18.0  ICA:     55.0      20.0       76.0      34.0       73.0      30.0  ICA/CCA:  0.6       1.1    ICA Stenosis: Normal    Left Vertebral:-  Finding: Antegrade  Sys:       95.0  Dia:       37.0    Left Subclavian: Normal    INTERPRETATION/FINDINGS  1. No evidence of significant arterial occlusive disease in the  internal carotid arteries without any evidence of visible plaque.  2. 1- 49 % of stenosis in the external carotid arteries bilaterally.  3. Antegrade flow in both vertebral arteries.  4. Normal flow in both subclavian arteries.    ADDITIONAL COMMENTS  Hyperechoic plaque noted between the bulb and ECA bilaterally.    I have personally reviewed the data relevant to the interpretation of  this  study.    TECHNOLOGIST: Patsey Berthold, [facility], RVT/  Signed: 05/12/2014 02:17 PM    PHYSICIAN: Mali Ida Uppal, D.O.  Signed: 05/14/2014 03:09 PM

## 2014-06-28 ENCOUNTER — Encounter: Primary: Internal Medicine

## 2014-06-28 ENCOUNTER — Inpatient Hospital Stay: Admit: 2014-06-28 | Payer: MEDICARE | Attending: Cardiovascular Disease | Primary: Internal Medicine

## 2014-06-28 DIAGNOSIS — Z4502 Encounter for adjustment and management of automatic implantable cardiac defibrillator: Secondary | ICD-10-CM

## 2014-06-28 LAB — POC PT/INR
INR: 0.9 (ref 0.0–1.1)
Prothrombin time: 11.2 seconds (ref 0.0–14.0)

## 2014-06-28 MED ORDER — ACETAMINOPHEN-CODEINE 300 MG-30 MG TAB
300-30 mg | ORAL | Status: DC | PRN
Start: 2014-06-28 — End: 2014-06-28

## 2014-06-28 MED ORDER — MIDAZOLAM 1 MG/ML IJ SOLN
1 mg/mL | INTRAMUSCULAR | Status: DC | PRN
Start: 2014-06-28 — End: 2014-06-28
  Administered 2014-06-28 (×2): via INTRAVENOUS

## 2014-06-28 MED ORDER — SODIUM CHLORIDE 0.9 % IV
INTRAVENOUS | Status: DC
Start: 2014-06-28 — End: 2014-06-28
  Administered 2014-06-28: 13:00:00 via INTRAVENOUS

## 2014-06-28 MED ORDER — SODIUM CHLORIDE 0.9 % IJ SYRG
INTRAMUSCULAR | Status: DC | PRN
Start: 2014-06-28 — End: 2014-06-28

## 2014-06-28 MED ORDER — ACETAMINOPHEN 325 MG TABLET
325 mg | ORAL | Status: DC | PRN
Start: 2014-06-28 — End: 2014-06-28

## 2014-06-28 MED ORDER — ONDANSETRON (PF) 4 MG/2 ML INJECTION
4 mg/2 mL | INTRAMUSCULAR | Status: DC | PRN
Start: 2014-06-28 — End: 2014-06-28

## 2014-06-28 MED ORDER — HEPARIN (PORCINE) IN NS (PF) 1,000 UNIT/500 ML IV
1000 unit/500 mL | INTRAVENOUS | Status: DC | PRN
Start: 2014-06-28 — End: 2014-06-28
  Administered 2014-06-28: 14:00:00

## 2014-06-28 MED ORDER — VERAPAMIL 2.5 MG/ML IV
2.5 mg/mL | INTRAVENOUS | Status: DC | PRN
Start: 2014-06-28 — End: 2014-06-28

## 2014-06-28 MED ORDER — SODIUM CHLORIDE 0.9 % IJ SYRG
Freq: Three times a day (TID) | INTRAMUSCULAR | Status: DC
Start: 2014-06-28 — End: 2014-06-28

## 2014-06-28 MED ORDER — VANCOMYCIN IN 0.9 % SODIUM CHLORIDE 1 GRAM/250 ML IV
1 gram/250 mL | Freq: Once | INTRAVENOUS | Status: AC
Start: 2014-06-28 — End: 2014-06-28
  Administered 2014-06-28: 14:00:00 via INTRAVENOUS

## 2014-06-28 MED ORDER — HEPARIN (PORCINE) 1,000 UNIT/ML IJ SOLN
1000 unit/mL | INTRAMUSCULAR | Status: DC | PRN
Start: 2014-06-28 — End: 2014-06-28

## 2014-06-28 MED ORDER — ONDANSETRON 4 MG TAB, RAPID DISSOLVE
4 mg | Freq: Four times a day (QID) | ORAL | Status: DC | PRN
Start: 2014-06-28 — End: 2014-06-28

## 2014-06-28 MED ORDER — IOVERSOL 320 MG/ML IV SOLN
320 mg iodine/mL | INTRAVENOUS | Status: DC | PRN
Start: 2014-06-28 — End: 2014-06-28

## 2014-06-28 MED ORDER — GUM MASTIC-STORAX-METHYLSALICYLATE-ALCOHOL IN A DROPPERETTE
Status: DC
Start: 2014-06-28 — End: 2014-06-28

## 2014-06-28 MED ORDER — GENTAMICIN 40 MG/ML IJ SOLN
40 mg/mL | Freq: Once | INTRAMUSCULAR | Status: AC
Start: 2014-06-28 — End: 2014-06-28
  Administered 2014-06-28: 15:00:00

## 2014-06-28 MED ORDER — NITROGLYCERIN 0.5 MG/ 10 ML (50 MCG/ML COMPOUNDED INJECTION)
0.5 mg/ 10mL | INTRAVENOUS | Status: DC | PRN
Start: 2014-06-28 — End: 2014-06-28

## 2014-06-28 MED ORDER — VANCOMYCIN 1,000 MG IV SOLR
1000 mg | INTRAVENOUS | Status: DC
Start: 2014-06-28 — End: 2014-06-28

## 2014-06-28 MED ORDER — FENTANYL CITRATE (PF) 50 MCG/ML IJ SOLN
50 mcg/mL | INTRAMUSCULAR | Status: DC | PRN
Start: 2014-06-28 — End: 2014-06-28
  Administered 2014-06-28 (×2): via INTRAVENOUS

## 2014-06-28 MED ORDER — HEPARIN (PORCINE) 1,000 UNIT/ML IJ SOLN
1000 unit/mL | INTRAMUSCULAR | Status: DC | PRN
Start: 2014-06-28 — End: 2014-06-28
  Administered 2014-06-28: 15:00:00 via INTRAVENOUS

## 2014-06-28 MED ORDER — LIDOCAINE HCL 1 % (10 MG/ML) IJ SOLN
10 mg/mL (1 %) | INTRAMUSCULAR | Status: DC | PRN
Start: 2014-06-28 — End: 2014-06-28
  Administered 2014-06-28: 14:00:00 via SUBCUTANEOUS

## 2014-06-28 MED FILL — GENTAMICIN 40 MG/ML IJ SOLN: 40 mg/mL | INTRAMUSCULAR | Qty: 2

## 2014-06-28 MED FILL — VANCOMYCIN 1,000 MG IV SOLR: 1000 mg | INTRAVENOUS | Qty: 1000

## 2014-06-28 MED FILL — SODIUM CHLORIDE 0.9 % IV: INTRAVENOUS | Qty: 1000

## 2014-06-28 MED FILL — LIDOCAINE HCL 1 % (10 MG/ML) IJ SOLN: 10 mg/mL (1 %) | INTRAMUSCULAR | Qty: 40

## 2014-06-28 MED FILL — ONDANSETRON 4 MG TAB, RAPID DISSOLVE: 4 mg | ORAL | Qty: 1

## 2014-06-28 MED FILL — HEPARIN (PORCINE) 1,000 UNIT/ML IJ SOLN: 1000 unit/mL | INTRAMUSCULAR | Qty: 10

## 2014-06-28 MED FILL — MIDAZOLAM 1 MG/ML IJ SOLN: 1 mg/mL | INTRAMUSCULAR | Qty: 4

## 2014-06-28 MED FILL — GUM MASTIC-STORAX-METHYLSALICYLATE-ALCOHOL IN A DROPPERETTE: Qty: 1

## 2014-06-28 MED FILL — FENTANYL CITRATE (PF) 50 MCG/ML IJ SOLN: 50 mcg/mL | INTRAMUSCULAR | Qty: 2

## 2014-06-28 MED FILL — VANCOMYCIN IN 0.9 % SODIUM CHLORIDE 1 GRAM/250 ML IV: 1 gram/250 mL | INTRAVENOUS | Qty: 250

## 2014-06-28 MED FILL — OPTIRAY 320 MG IODINE/ML INTRAVENOUS SOLUTION: 320 mg iodine/mL | INTRAVENOUS | Qty: 1000

## 2014-06-28 NOTE — Procedures (Signed)
[place]  Cardiac Implant Devices Generator replacement   NAME:  Kathryn [name], Kathryn [name]  MR#    [id number]   DATE: 06/28/2014  IMPLANTING PHYSICIAN: Gerilyn Pilgrim MD  CATH:   PT. LOCATIONHarmon Dun  BILLING# [id number]    cc: Trenda Moots MD, Jacqualine Code MD    The patient is a 74 year old female who has a history of nonischemic   cardiomyopathy and biventricular pacemaker ICD who presents for ICD generator   change due to elective replacement indicator.  The patient has had no ICD   shocks.  It is a right-sided implanted and all leads were working well.  She   is ALLERGIC TO PENICILLIN, so vancomycin will be given for preoperative   antibiotics.    DESCRIPTION OF PROCEDURE:  Informed consent was obtained.  Right infraclavicular area was prepped and   draped in the normal sterile fashion.  One percent lidocaine was used for   local anesthesia.  Versed and fentanyl were used for conscious sedation.    Continuous pulse oximetry, ECG monitoring and hemodynamic monitoring were   performed.  After 1% lidocaine local anesthesia, previous incision was incised   and dissected down to the ICD using blunt dissection and electrocautery.  The   explanted device is [place] [id number], serial [id number] implanted 06/12/2008.    The leads were disconnected and interrogated.  The right atrial lead IS A   St. Jude [id number], serial [id number] implanted 06/12/2008.  P wave is 3.3   millivolts, pacing threshold is 0.5 volts, 0.5 milliseconds, lead impedance   350 ohms.  The right ventricular lead is [place] [id number], serial [id number]   implanted 06/12/2008.  R wave is 11.8 mV, pacing threshold is 0.8 volt, 0.5   milliseconds, lead impedance 380 ohms.  The left ventricular lead is St. Jude   [id number], serial [id number] implanted 06/12/2008.  LV threshold is 0.8 volts, 0.5   millisecond, bipolar lead impedance 710 ohms.  All leads were felt to be   appropriately functioning.  The pocket was copiously irrigated with antibiotic    solution.  A new [place] [id number], serial [id number] was   connected to the RA, RV, a DF4 lead and LV leads.  All leads were seen to be   appropriately placed and the header set screws were tightened.  The tug test   was negative.  The pocket was copiously irrigated with antibiotic solution.    The ICD was placed in the pocket and the pocket was closed with 2 layers of   2-0 Vicryl and 4-0 Monocryl suture.  Surgical glue and dressing were placed.    Device programmed DDD-60-130, VT 1 monitor only 150, VT 2 171, ATPX2 followed   by 30 to 40 joule shocks, VF 200, ATP while charging, 30 to 40 joule shocks.    COMPLICATIONS:  None.    SPECIMEN:  None.    ESTIMATED BLOOD LOSS:  Minimal.    CONDITION:  Stable.      ___________________  Gerilyn Pilgrim MD  Dictated By: .   SC  D:06/28/2014 09:32:35  T: 06/28/2014 11:40:16  [id number]

## 2014-06-28 NOTE — Brief Op Note (Signed)
BRIEF OPERATIVE NOTE    Date of Procedure: 06/28/14  Preoperative Diagnosis: nonischemic cardiomyopathy,chronic lv systolic heart failure,biv icd in situ  Postoperative Diagnosis: same    *BIV ICD Generator change  Anesthesia: local,versed,fentanyl  Estimated Blood Loss: minimal  Specimens: none   Findings: normal lead function,no dft testing  Complications: none  Implants: EXPLANTED DEVICE ST JUDE [id number] DOI 06/12/08  NEW DEVICE ST JUDE UNIFY ASSURA [id number]  RA LEAD ST JUDE 1999 PJ:[id number]  RV LEAD ST JUDE 7122Q PF:[id number]  LV LEAD 1156T MN:[id number]  PROGRAMMED  DDD 60-130  VT 1 150 MONITOR  VT 2 171 ATPX2,30J,40J  VF 200 ATP,30J,40J

## 2014-06-28 NOTE — Procedures (Signed)
[place]  Cardiac Implant Devices Generator replacement   NAME:  Kathryn [name], Kathryn [name]  MR#    [id number]   DATE: 06/28/2014  IMPLANTING PHYSICIAN: Gerilyn Pilgrim MD  CATH:   PT. LOCATIONHarmon Dun  BILLING# [id number]    cc: Trenda Moots MD, Jacqualine Code MD    The patient is a 74 year old female who has a history of nonischemic   cardiomyopathy and biventricular pacemaker ICD who presents for ICD generator   change due to elective replacement indicator.  The patient has had no ICD   shocks.  It is a right-sided implanted and all leads were working well.  She   is ALLERGIC TO PENICILLIN, so vancomycin will be given for preoperative   antibiotics.    DESCRIPTION OF PROCEDURE:  Informed consent was obtained.  Right infraclavicular area was prepped and   draped in the normal sterile fashion.  One percent lidocaine was used for   local anesthesia.  Versed and fentanyl were used for conscious sedation.    Continuous pulse oximetry, ECG monitoring and hemodynamic monitoring were   performed.  After 1% lidocaine local anesthesia, previous incision was incised   and dissected down to the ICD using blunt dissection and electrocautery.  The   explanted device is [place] [id number], serial [id number] implanted 06/12/2008.    The leads were disconnected and interrogated.  The right atrial lead IS A   St. Jude [id number], serial [id number] implanted 06/12/2008.  P wave is 3.3   millivolts, pacing threshold is 0.5 volts, 0.5 milliseconds, lead impedance   350 ohms.  The right ventricular lead is [place] [id number], serial [id number]   implanted 06/12/2008.  R wave is 11.8 mV, pacing threshold is 0.8 volt, 0.5   milliseconds, lead impedance 380 ohms.  The left ventricular lead is St. Jude   [id number], serial [id number] implanted 06/12/2008.  LV threshold is 0.8 volts, 0.5   millisecond, bipolar lead impedance 710 ohms.  All leads were felt to be   appropriately functioning.  The pocket was copiously irrigated with antibiotic   solution.  A  new [place] [id number], serial [id number] was   connected to the RA, RV, a DF4 lead and LV leads.  All leads were seen to be   appropriately placed and the header set screws were tightened.  The tug test   was negative.  The pocket was copiously irrigated with antibiotic solution.    The ICD was placed in the pocket and the pocket was closed with 2 layers of   2-0 Vicryl and 4-0 Monocryl suture.  Surgical glue and dressing were placed.    Device programmed DDD-60-130, VT 1 monitor only 150, VT 2 171, ATPX2 followed   by 30 to 40 joule shocks, VF 200, ATP while charging, 30 to 40 joule shocks.    COMPLICATIONS:  None.    SPECIMEN:  None.    ESTIMATED BLOOD LOSS:  Minimal.    CONDITION:  Stable.      ___________________  Gerilyn Pilgrim MD  Dictated By: .   SC  D:06/28/2014 56:38:75  T: 06/28/2014 11:40:16  [id number]

## 2014-08-24 ENCOUNTER — Encounter

## 2014-09-25 ENCOUNTER — Inpatient Hospital Stay: Payer: MEDICARE | Attending: Internal Medicine | Primary: Internal Medicine

## 2014-09-27 ENCOUNTER — Inpatient Hospital Stay: Admit: 2014-09-27 | Payer: MEDICARE | Attending: Internal Medicine | Primary: Internal Medicine

## 2014-09-27 DIAGNOSIS — R634 Abnormal weight loss: Secondary | ICD-10-CM

## 2014-09-27 LAB — CREATININE, POC
Creatinine, POC: 1.2 MG/DL (ref 0.6–1.3)
GFRAA, POC: 53 mL/min/{1.73_m2} — ABNORMAL LOW (ref >60)
GFRNA, POC: 44 mL/min/{1.73_m2} — ABNORMAL LOW (ref >60)

## 2014-09-27 MED ORDER — IOPAMIDOL 61 % IV SOLN
300 mg iodine /mL (61 %) | Freq: Once | INTRAVENOUS | Status: AC
Start: 2014-09-27 — End: 2014-09-27
  Administered 2014-09-27: 18:00:00 via INTRAVENOUS

## 2014-09-27 MED FILL — ISOVUE-300  61 % INTRAVENOUS SOLUTION: 300 mg iodine /mL (61 %) | INTRAVENOUS | Qty: 500

## 2014-10-02 ENCOUNTER — Encounter

## 2014-10-12 ENCOUNTER — Inpatient Hospital Stay: Payer: MEDICARE | Attending: Internal Medicine | Primary: Internal Medicine

## 2014-10-12 ENCOUNTER — Inpatient Hospital Stay: Admit: 2014-10-12 | Payer: MEDICARE | Attending: Internal Medicine | Primary: Internal Medicine

## 2014-10-12 DIAGNOSIS — R634 Abnormal weight loss: Secondary | ICD-10-CM

## 2014-10-12 MED ORDER — IOPAMIDOL 61 % IV SOLN
300 mg iodine /mL (61 %) | Freq: Once | INTRAVENOUS | Status: AC
Start: 2014-10-12 — End: 2014-10-12
  Administered 2014-10-12: 15:00:00 via INTRAVENOUS

## 2014-10-12 MED FILL — ISOVUE-300  61 % INTRAVENOUS SOLUTION: 300 mg iodine /mL (61 %) | INTRAVENOUS | Qty: 500

## 2014-10-13 ENCOUNTER — Encounter

## 2014-10-18 ENCOUNTER — Inpatient Hospital Stay: Admit: 2014-10-18 | Payer: MEDICARE | Attending: Internal Medicine | Primary: Internal Medicine

## 2014-10-18 DIAGNOSIS — K869 Disease of pancreas, unspecified: Secondary | ICD-10-CM

## 2014-10-18 MED ORDER — IOPAMIDOL 61 % IV SOLN
300 mg iodine /mL (61 %) | Freq: Once | INTRAVENOUS | Status: AC
Start: 2014-10-18 — End: 2014-10-18
  Administered 2014-10-18: 19:00:00 via INTRAVENOUS

## 2014-10-18 MED FILL — ISOVUE-300  61 % INTRAVENOUS SOLUTION: 300 mg iodine /mL (61 %) | INTRAVENOUS | Qty: 500

## 2014-11-23 ENCOUNTER — Encounter

## 2015-02-07 ENCOUNTER — Encounter

## 2015-07-24 ENCOUNTER — Ambulatory Visit: Payer: MEDICARE | Primary: Internal Medicine

## 2015-07-27 ENCOUNTER — Inpatient Hospital Stay
Admit: 2015-07-27 | Discharge: 2015-07-28 | Disposition: A | Discharge status: Home / Self Care | Payer: MEDICARE | Attending: Emergency Medicine

## 2015-07-27 ENCOUNTER — Emergency Department: Admit: 2015-07-28 | Payer: MEDICARE | Primary: Internal Medicine

## 2015-07-27 DIAGNOSIS — R079 Chest pain, unspecified: Secondary | ICD-10-CM

## 2015-07-27 NOTE — ED Notes (Signed)
Pt with constant pulse in 80  To 86 monitor not reading due to pacemaker,

## 2015-07-27 NOTE — ED Notes (Signed)
Pt to er after short of breath and vomited one time today pt resting on streacher family at bedside.

## 2015-07-27 NOTE — ED Triage Notes (Signed)
Pt complains of productive cough, chest congestion and sob x3 days.  When pt was being triaged pt also complained of sharp, intermittent bilateral chest pain x3 days.

## 2015-07-27 NOTE — ED Notes (Signed)
Pt to xray at this time resp treatment done no wheezeing noted pt smiles she has no complaints vss

## 2015-07-27 NOTE — ED Provider Notes (Signed)
[facility]  Emergency Department Treatment Report        Patient: Kathryn [name] Age: 75 y.o. Sex: female    Date of Birth: [birth year] Admit Date: 07/27/2015 PCP: Royanne Foots, MD   MRN: [id number]  CSN: [id number]     Room: ER34/ER34 Time Dictated: 9:42 PM        Chief Complaint   Chief Complaint   Patient presents with   ??? Cough     vomiting today cough short of breath when walking and moving chest pain 5 minutes ago    ??? Chest Congestion       History of Present Illness     This is a 75 y.o. female with 4 days of cough, nonproductive, with 2 episodes of posttussive emesis today, presents today because of cough, chest pain and shortness of breath. The cough is nonproductive. The chest pain occurs lasting 3-4 minutes, radiating to the right side of the chest from the sternum, exacerbated according to nursing notes by movement or walking but for me she states it only hurts to move. She feels short of breath, presents for further evaluation. Denies associated lightheadedness, diaphoresis, or nausea, but is having occasional posttussive emesis dose today.     Review of Systems   Constitutional: No fever, chills, or weight loss  Eyes: No visual symptoms.  ENT: No sore throat, runny nose or ear pain.  Respiratory: No wheezing. No PND or orthopnea. Dyspnea as above.   Cardiovascular: As above.   Gastrointestinal: No vomiting, diarrhea or abdominal pain.  Genitourinary: No dysuria, frequency, or urgency.  Musculoskeletal: No joint pain or swelling.  Integumentary: No rashes.  Neurological: No headaches, sensory or motor symptoms.  Denies complaints in all other systems.    Past Medical/Surgical History     Past Medical History:   Diagnosis Date   ??? Arrhythmia    ??? Arthritis    ??? CAD (coronary artery disease)    ??? Cancer ([place])     Left breast   ??? Diabetes ([place])    ??? GERD (gastroesophageal reflux disease)    ??? Heart failure ([place])    ??? Hypertension     ??? 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 BREAST SURGERY PROCEDURE UNLISTED      Left Mastectomy   ??? HX GYN      Hysterectomy   ??? HX HEENT      Tosillectomy   ??? HX PACEMAKER  2010     **Patient has a pacemaker, by my review of records and discussion with family no objective history of coronary artery disease. She had a negative cardiac catheter in 2008. 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    No history of DVT, PE, recent hormone use, recent travel, recent surgeries, recent splinting, recent fractures or hemoptysis.    Social History     Social History     Social History   ??? Marital status: MARRIED     Spouse name: N/A   ??? Number of children: N/A   ??? Years of education: N/A  Occupational History   ??? Not on file.     Social History Main Topics   ??? Smoking status: Former Smoker     Quit date: 07/01/1967   ??? Smokeless tobacco: Not on file   ??? Alcohol use No   ??? Drug use: No   ??? Sexual activity: Not on file     Other Topics Concern   ??? Not on file     Social History Narrative       Family History   History reviewed. No pertinent family history.    Current Medications     Prior to Admission Medications   Prescriptions Last Dose Informant Patient Reported? Taking?   ALPRAZolam (XANAX) 0.5 mg tablet   Yes No   Sig: Take 0.5 mg by mouth nightly as needed for Anxiety.   Cholecalciferol, Vitamin D3, 5,000 unit Tab   Yes No   Sig: Take  by mouth daily.   albuterol (PROVENTIL HFA, VENTOLIN HFA, PROAIR HFA) 90 mcg/actuation inhaler   Yes No    Sig: Take 2 Puffs by inhalation every four (4) hours as needed for Wheezing.   allopurinol (ZYLOPRIM) 100 mg tablet   Yes No   Sig: Take 100 mg by mouth daily.   aspirin 81 mg tablet   Yes No   Sig: Take 81 mg by mouth daily.   carvedilol (COREG) 3.125 mg tablet   Yes No   Sig: Take 3.125 mg by mouth two (2) times a day. 2TABS IN AM, 1 IN THE EVENING   furosemide (LASIX) 20 mg tablet   Yes No   Sig: Take 20 mg by mouth daily.   gabapentin (NEURONTIN) 300 mg tablet   Yes No   Sig: Take 300 mg by mouth daily.   losartan (COZAAR) 25 mg tablet   Yes No   Sig: Take 25 mg by mouth daily.   metFORMIN (GLUCOPHAGE) 500 mg tablet   Yes No   Sig: Take 500 mg by mouth daily (with breakfast).   simvastatin (ZOCOR) 20 mg tablet   Yes No   Sig: Take 20 mg by mouth nightly.   spironolactone (ALDACTONE) 25 mg tablet   Yes No   Sig: Take 25 mg by mouth daily. 1/2 TAB DAILY      Facility-Administered Medications: None       Allergies     Allergies   Allergen Reactions   ??? Pcn [Penicillins] Itching   ??? Sulfa (Sulfonamide Antibiotics) Other (comments)     CAN'T REMEMBER       Physical Exam   ED Triage Vitals   Enc Vitals Group      BP 07/27/15 1942 104/61      Pulse (Heart Rate) 07/27/15 1942 96      Resp Rate 07/27/15 1942 16      Temp 07/27/15 1942 98.5 ??F (36.9 ??C)      Temp src --       O2 Sat (%) 07/27/15 1942 97 %      Weight 07/27/15 1943 153 lb      Height 07/27/15 1943 5\' 4"       Head Cir --       Peak Flow --       Pain Score --       Pain Loc --       Pain Edu? --       Excl. in [place]? --      Constitutional: Patient appears well developed and well nourished. Marland Kitchen  Appearance and behavior are age and situation appropriate.  Eyes: Conjutivae clear, lids normal. Pupils equal, symmetrical, and normally reactive.  HEENT: Ears/Nose: Hearing is grossly intact to voice. Internal and external examinations of the ears and nose are unremarkable. Conjunctiva clear.  PERRLA. Mucous membranes moist, non-erythematous. Surface of the  pharynx, palate, and tongue are pink, moist and without lesions.  Neck: supple, non tender, symmetrical, no masses or JVD.   Respiratory: Mild rhonchi and expiratory wheezing, no respiratory distress tachypnea or accessory muscle use.  Cardiovascular: heart regular rate and rhythm without murmur rubs or gallops. Chest tender to palpation of sternum.  Calves soft and non-tender. Distal pulses 2+ and equal bilaterally.  No peripheral edema or significant variscosities.    Gastrointestinal:  Abdomen soft, nontender without complaint of pain to palpation  Musculoskeletal: Nail beds pink with prompt capillary refill  Integumentary: warm and dry without rashes or lesions  Neurologic: alert and oriented, Sensation intact, motor strength equal and symmetric.  No facial asymmetry or dysarthria.     Impression and Management Plan     Chest Pain Impression: patient with chest pain. Acute ischemic coronary disease must be considered first, and the patient protected against consequences of same, while other etiologies (including infectious, pulmonary, GI and musculoskeletal)  are considered. No Rales, we'll obtain screening proBNP given history of cardiomyopathy and CHF. Overall, given harsh cough that she is exhibiting in the room I suspect this is likely infectious, we'll see if she improves with nebulizer treatments. Low suspicion for PE, vital signs are stable and other than age no other risk factors. Pain is not pleuritic with inspiration. If she fails to improve or worsens we'll consider this again.         Procedures      Diagnostic Studies   LAB/IMAGING:   Results for orders placed or performed during the hospital encounter of 07/27/15   XR CHEST PA LAT    Narrative    Exam: Frontal and lateral chest.    Clinical indication: Dyspnea    Comparison: Aug 09, 2009    Results:   Heart normal.  Mediastinal contours are normal.  No consolidation.   No pleural effusions.    Right chest pacemaker.     No pneumothorax.  No lung  masses.  No free air is seen under the  hemidiaphragms.   Osseous structures intact. No frank pulmonary edema.       Impression    IMPRESSION:  No acute disease. No congestive heart failure.     CBC WITH AUTOMATED DIFF   Result Value Ref Range    WBC 4.1 4.0 - 11.0 1000/mm3    RBC 3.72 3.60 - 5.20 M/uL    HGB 11.4 (L) 13.0 - 17.2 gm/dl    HCT 33.9 (L) 37.0 - 50.0 %    MCV 91.1 80.0 - 98.0 fL    MCH 30.6 25.4 - 34.6 pg    MCHC 33.6 30.0 - 36.0 gm/dl    PLATELET 259 140 - 450 1000/mm3    MPV 8.6 6.0 - 10.0 fL    RDW-SD 44.2 36.4 - 46.3      NRBC 0 0 - 0      IMMATURE GRANULOCYTES 0.2 0.0 - 3.0 %    NEUTROPHILS 55.7 34 - 64 %    LYMPHOCYTES 24.0 (L) 28 - 48 %    MONOCYTES 15.5 (H) 1 - 13 %    EOSINOPHILS 3.6 0 - 5 %    BASOPHILS  1.0 0 - 3 %   METABOLIC PANEL, BASIC   Result Value Ref Range    Sodium 136 136 - 145 mEq/L    Potassium 4.1 3.5 - 5.1 mEq/L    Chloride 101 98 - 107 mEq/L    CO2 29 21 - 32 mEq/L    Glucose 90 74 - 106 mg/dl    BUN 21 7 - 25 mg/dl    Creatinine 1.7 (H) 0.6 - 1.3 mg/dl    GFR est AA 38.0      GFR est non-AA 31      Calcium 10.1 8.5 - 10.1 mg/dl   PRO-BNP   Result Value Ref Range    NT pro-BNP 286.6 (H) 0.0 - 125.0 pg/ml   POC TROPONIN-I   Result Value Ref Range    Troponin-I 0.01 0.00 - 0.07 ng/ml   POC TROPONIN-I   Result Value Ref Range    Troponin-I 0.00 0.00 - 0.07 ng/ml       EKG  Dr. Ysidro Evert did not see any acute S-T segment or T-wave abnormalities that are consistent with acute ischemia or infarction. Vent rate of 86 bpm.     Medical Decision Making/ ED Course     During the patient's stay in the ER the patient did not develop any new or worsening symptoms and remained stable. After a breathing treatment, the chest pain, shortness of breath and wheezing resolved and on Dr. Ysidro Evert' reexamination the lungs were clear. We felt this to be consistent with bronchitis given responsiveness to bronchodilators. I discussed the  patient's cardiac risk factors with her, she myself husband and family in the room all agreed on a three-hour troponin, and if improved or non-worsens discharging for outpatient follow-up. She was quite comfortable with this plan and's was the family's preference as well. I did explain that they were not comfortable this plan and they could stay for full chest pain protocol that we felt that this was likely bronchitis in nature and were comfortable with discharging after a normal three-hour troponin if her family was comfortable with that. Given first dose of Zithromax given patient's history of smoking and age.    Final Diagnosis       ICD-10-CM ICD-9-CM   1. Acute chest pain R07.9 786.50   2. Acute bronchitis, unspecified organism J20.9 466.0       Disposition     Disposition and plan  Prescribed Zithromax, Tessalon Perles and inhaler. Patient was discharged home in stable condition with discharge instructions on the same.     Return to the ER if condition worsens or new symptoms develop.   Follow up with primary care as discussed.     The patient was personally evaluated by myself and  Dr. Ysidro Evert, [place] who agrees with the above assessment and plan.    Dragon medical dictation software was used for portions of this report. Unintended errors may occur.     Porfirio Mylar, MPA, PA-C  Jul 27, 2015    My signature above authenticates this document and my orders, the final ??  diagnosis (es), discharge prescription (s), and instructions in the Epic ??  record.  If you have any questions please contact [phone].  ??  Nursing notes have been reviewed by the physician/ advanced practice ??  Clinician.

## 2015-07-28 LAB — CBC WITH AUTOMATED DIFF
BASOPHILS: 1 % (ref 0–3)
EOSINOPHILS: 3.6 % (ref 0–5)
HCT: 33.9 % — ABNORMAL LOW (ref 37.0–50.0)
HGB: 11.4 gm/dl — ABNORMAL LOW (ref 13.0–17.2)
IMMATURE GRANULOCYTES: 0.2 % (ref 0.0–3.0)
LYMPHOCYTES: 24 % — ABNORMAL LOW (ref 28–48)
MCH: 30.6 pg (ref 25.4–34.6)
MCHC: 33.6 gm/dl (ref 30.0–36.0)
MCV: 91.1 fL (ref 80.0–98.0)
MONOCYTES: 15.5 % — ABNORMAL HIGH (ref 1–13)
MPV: 8.6 fL (ref 6.0–10.0)
NEUTROPHILS: 55.7 % (ref 34–64)
NRBC: 0 (ref 0–0)
PLATELET: 259 10*3/uL (ref 140–450)
RBC: 3.72 M/uL (ref 3.60–5.20)
RDW-SD: 44.2 (ref 36.4–46.3)
WBC: 4.1 10*3/uL (ref 4.0–11.0)

## 2015-07-28 LAB — EKG, 12 LEAD, INITIAL
Atrial Rate: 86 {beats}/min
Calculated P Axis: 63 degrees
Calculated R Axis: 86 degrees
Calculated T Axis: 81 degrees
Diagnosis: NORMAL
P-R Interval: 138 ms
Q-T Interval: 370 ms
QRS Duration: 112 ms
QTC Calculation (Bezet): 442 ms
Ventricular Rate: 86 {beats}/min

## 2015-07-28 LAB — METABOLIC PANEL, BASIC
BUN: 21 mg/dl (ref 7–25)
CO2: 29 mEq/L (ref 21–32)
Calcium: 10.1 mg/dl (ref 8.5–10.1)
Chloride: 101 mEq/L (ref 98–107)
Creatinine: 1.7 mg/dl — ABNORMAL HIGH (ref 0.6–1.3)
GFR est AA: 38
GFR est non-AA: 31
Glucose: 90 mg/dl (ref 74–106)
Potassium: 4.1 mEq/L (ref 3.5–5.1)
Sodium: 136 mEq/L (ref 136–145)

## 2015-07-28 LAB — POC TROPONIN
Troponin-I: 0.01 ng/ml (ref 0.00–0.07)
Troponin-I: 0 ng/ml (ref 0.00–0.07)

## 2015-07-28 LAB — NT-PRO BNP: NT pro-BNP: 286.6 pg/ml — ABNORMAL HIGH (ref 0.0–125.0)

## 2015-07-28 MED ORDER — AZITHROMYCIN 250 MG TAB
250 mg | ORAL_TABLET | Freq: Every day | ORAL | 0 refills | Status: DC
Start: 2015-07-28 — End: 2016-10-20

## 2015-07-28 MED ORDER — IPRATROPIUM-ALBUTEROL 2.5 MG-0.5 MG/3 ML NEB SOLUTION
2.5 mg-0.5 mg/3 ml | RESPIRATORY_TRACT | Status: AC
Start: 2015-07-28 — End: 2015-07-27
  Administered 2015-07-28: 01:00:00 via RESPIRATORY_TRACT

## 2015-07-28 MED ORDER — AZITHROMYCIN 250 MG TAB
250 mg | ORAL | Status: AC
Start: 2015-07-28 — End: 2015-07-27
  Administered 2015-07-28: 03:00:00 via ORAL

## 2015-07-28 MED ORDER — BENZONATATE 100 MG CAP
100 mg | ORAL_CAPSULE | Freq: Three times a day (TID) | ORAL | 0 refills | Status: AC | PRN
Start: 2015-07-28 — End: 2015-08-04

## 2015-07-28 MED ORDER — ALBUTEROL SULFATE 0.083 % (0.83 MG/ML) SOLN FOR INHALATION
2.5 mg /3 mL (0.083 %) | RESPIRATORY_TRACT | Status: AC
Start: 2015-07-28 — End: 2015-07-27
  Administered 2015-07-28: 01:00:00

## 2015-07-28 MED ORDER — BENZONATATE 100 MG CAP
100 mg | Freq: Three times a day (TID) | ORAL | Status: DC | PRN
Start: 2015-07-28 — End: 2015-07-28
  Administered 2015-07-28: 03:00:00 via ORAL

## 2015-07-28 MED ORDER — ALBUTEROL SULFATE HFA 90 MCG/ACTUATION AEROSOL INHALER
90 mcg/actuation | Freq: Four times a day (QID) | RESPIRATORY_TRACT | 0 refills | Status: DC | PRN
Start: 2015-07-28 — End: 2016-10-20

## 2015-07-28 MED FILL — BENZONATATE 100 MG CAP: 100 mg | ORAL | Qty: 1

## 2015-07-28 MED FILL — IPRATROPIUM-ALBUTEROL 2.5 MG-0.5 MG/3 ML NEB SOLUTION: 2.5 mg-0.5 mg/3 ml | RESPIRATORY_TRACT | Qty: 3

## 2015-07-28 MED FILL — ALBUTEROL SULFATE 0.083 % (0.83 MG/ML) SOLN FOR INHALATION: 2.5 mg /3 mL (0.083 %) | RESPIRATORY_TRACT | Qty: 1

## 2015-07-28 MED FILL — AZITHROMYCIN 250 MG TAB: 250 mg | ORAL | Qty: 2

## 2015-08-14 ENCOUNTER — Ambulatory Visit: Payer: MEDICARE | Primary: Internal Medicine

## 2015-09-04 ENCOUNTER — Inpatient Hospital Stay: Admit: 2015-09-04 | Payer: MEDICARE | Attending: Hematology & Oncology | Primary: Internal Medicine

## 2015-09-04 DIAGNOSIS — C50812 Malignant neoplasm of overlapping sites of left female breast: Secondary | ICD-10-CM

## 2015-09-04 LAB — CREATININE, POC
Creatinine, POC: 1.2 MG/DL (ref 0.6–1.3)
GFRAA, POC: 53 mL/min/{1.73_m2} — ABNORMAL LOW (ref >60)
GFRNA, POC: 44 mL/min/{1.73_m2} — ABNORMAL LOW (ref >60)

## 2015-09-04 MED ORDER — IOPAMIDOL 61 % IV SOLN
300 mg iodine /mL (61 %) | Freq: Once | INTRAVENOUS | Status: AC
Start: 2015-09-04 — End: 2015-09-04
  Administered 2015-09-04: 15:00:00 via INTRAVENOUS

## 2015-09-04 MED FILL — ISOVUE-300  61 % INTRAVENOUS SOLUTION: 300 mg iodine /mL (61 %) | INTRAVENOUS | Qty: 100

## 2016-08-27 ENCOUNTER — Encounter

## 2016-09-03 ENCOUNTER — Ambulatory Visit: Payer: MEDICARE | Primary: Internal Medicine

## 2016-10-22 ENCOUNTER — Inpatient Hospital Stay: Payer: MEDICARE

## 2016-10-22 ENCOUNTER — Ambulatory Visit: Payer: MEDICARE | Primary: Internal Medicine

## 2016-10-22 LAB — GLUCOSE, POC: Glucose (POC): 105 mg/dL (ref 65–105)

## 2016-10-22 MED ORDER — LIDOCAINE (PF) 10 MG/ML (1 %) IJ SOLN
10 mg/mL (1 %) | INTRAMUSCULAR | Status: DC | PRN
Start: 2016-10-22 — End: 2016-10-22
  Administered 2016-10-22: 13:00:00 via INTRAVENOUS

## 2016-10-22 MED ORDER — SODIUM CHLORIDE 0.9 % IV
10 mg/mL | INTRAVENOUS | Status: DC | PRN
Start: 2016-10-22 — End: 2016-10-22
  Administered 2016-10-22: 13:00:00 via INTRAVENOUS

## 2016-10-22 MED ORDER — PROPOFOL 10 MG/ML IV EMUL
10 mg/mL | INTRAVENOUS | Status: DC | PRN
Start: 2016-10-22 — End: 2016-10-22
  Administered 2016-10-22: 13:00:00 via INTRAVENOUS

## 2016-10-22 MED ORDER — SODIUM CHLORIDE 0.9 % IV
INTRAVENOUS | Status: DC | PRN
Start: 2016-10-22 — End: 2016-10-22
  Administered 2016-10-22: 13:00:00 via INTRAVENOUS

## 2016-10-22 NOTE — H&P (Signed)
Gastroenterology Consult     Referring Physician: Jacqualine Code  Consult Date: 10/22/2016     Subjective:     Chief Complaint: surveillance colonoscopy    History of Present Illness: Kathryn [name] is a 75 y.o. female who is seen in consultation for surveillance colonoscopy. Patient was evaluated and examined in the office. Please see scanned note. No interval changes in medical status or examination.     Past Medical History:   Diagnosis Date   ??? Arrhythmia    ??? Arthritis    ??? CAD (coronary artery disease)    ??? Cancer ([place])     Left breast   ??? Diabetes ([place])    ??? GERD (gastroesophageal reflux disease)    ??? Heart failure ([place])    ??? Hypertension    ??? 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 BREAST SURGERY PROCEDURE UNLISTED      Left Mastectomy   ??? HX GYN      Hysterectomy   ??? HX HEENT      Tosillectomy   ??? HX PACEMAKER  2010    [facility]      Family History   Problem Relation Age of Onset   ??? Diabetes Mother    ??? Hypertension Mother    ??? Diabetes Father    ??? Hypertension Father    ??? Stroke Father      Social History   Substance Use Topics   ??? Smoking status: Former Smoker     Packs/day: 0.25     Years: 0.50     Quit date: 07/01/1967   ??? Smokeless tobacco: Never Used   ??? Alcohol use No      Allergies   Allergen Reactions   ??? Pcn [Penicillins] Itching   ??? Sulfa (Sulfonamide Antibiotics) Other (comments)     CAN'T REMEMBER     No current facility-administered medications for this encounter.         Review of Systems:  A detailed 10 organ review of systems is obtained with pertinent positives  as listed in the History of Present Illness and Past Medical History. All others are negative.    Objective:     Physical Exam:  Visit Vitals   ??? BP 135/76 (BP 1 Location: Left arm, BP Patient Position: Sitting)   ??? Pulse 79   ??? Temp 98.4 ??F (36.9 ??C)   ??? Resp 16   ??? Ht 5\' 4"  (1.626 m)   ??? Wt 74.2 kg (163 lb 9.3 oz)   ??? SpO2 100%   ??? BMI 28.08 kg/m2      HEENT: Sclerae anicteric.  Cardiovascular: Regular rate and rhythm.  Respiratory:  Comfortable breathing with no accessory muscle use.   GI:  Abdomen nondistended, soft, and nontender.    Neurological:  Gross memory appears intact.  Patient is alert and oriented.      Assessment/Plan:     Colonoscopy as planned

## 2016-10-22 NOTE — Anesthesia Post-Procedure Evaluation (Signed)
Post-Anesthesia Evaluation and Assessment    Patient: Kathryn [name] MRN: [id number]  SSN: [id number]    Date of Birth: [birth year]  Age: 76 y.o.  Sex: female       Cardiovascular Function/Vital Signs  Visit Vitals   ??? BP 96/69   ??? Pulse 77   ??? Temp 36.9 ??C (98.4 ??F)   ??? Resp 16   ??? Ht 5' 4"  (1.626 m)   ??? Wt 74.2 kg (163 lb 9.3 oz)   ??? SpO2 98%   ??? BMI 28.08 kg/m2       Patient is status post MAC anesthesia for Procedure(s):  COLONOSCOPY, SURVEILLANCE.    Nausea/Vomiting: None    Postoperative hydration reviewed and adequate.    Pain:  Pain Scale 1: Numeric (0 - 10) (10/22/16 0939)  Pain Intensity 1: 0 (10/22/16 0939)   Managed    Neurological Status:       At baseline    Mental Status and Level of Consciousness: Arousable    Pulmonary Status:   O2 Device: Room air (10/22/16 2897)   Adequate oxygenation and airway patent    Complications related to anesthesia: None    Post-anesthesia assessment completed. No concerns    Signed By: Avel Peace, MD     October 22, 2016

## 2016-10-22 NOTE — Anesthesia Pre-Procedure Evaluation (Addendum)
Anesthetic History               Review of Systems / Medical History  Patient summary reviewed and nursing notes reviewed    Pulmonary                   Neuro/Psych              Cardiovascular    Hypertension      CHF (Nonischemic cardiomyopathy)  Dysrhythmias   Pacemaker (AICD) and CAD         GI/Hepatic/Renal     GERD           Endo/Other    Diabetes: type 2    Arthritis     Other Findings            Physical Exam    Airway  Mallampati: II  TM Distance: 4 - 6 cm  Neck ROM: normal range of motion        Cardiovascular  Regular rate and rhythm,  S1 and S2 normal,  no murmur, click, rub, or gallop             Dental  No notable dental hx       Pulmonary  Breath sounds clear to auscultation               Abdominal         Other Findings            Anesthetic Plan    ASA: 3  Anesthesia type: MAC and total IV anesthesia          Induction: Intravenous  Anesthetic plan and risks discussed with: Patient

## 2016-10-23 MED FILL — SODIUM CHLORIDE 0.9 % IV: INTRAVENOUS | Qty: 100

## 2016-10-23 MED FILL — PHENYLEPHRINE 10 MG/ML INJECTION: 10 mg/mL | INTRAMUSCULAR | Qty: 100

## 2016-10-23 MED FILL — XYLOCAINE-MPF 10 MG/ML (1 %) INJECTION SOLUTION: 10 mg/mL (1 %) | INTRAMUSCULAR | Qty: 50

## 2016-10-23 MED FILL — PROPOFOL 10 MG/ML IV EMUL: 10 mg/mL | INTRAVENOUS | Qty: 30

## 2016-10-23 MED FILL — PROPOFOL 10 MG/ML IV EMUL: 10 mg/mL | INTRAVENOUS | Qty: 81.62

## 2017-06-01 ENCOUNTER — Encounter

## 2017-06-10 ENCOUNTER — Encounter

## 2017-06-10 ENCOUNTER — Inpatient Hospital Stay: Admit: 2017-06-10 | Payer: MEDICARE | Attending: Internal Medicine | Primary: Internal Medicine

## 2017-06-10 DIAGNOSIS — Z1231 Encounter for screening mammogram for malignant neoplasm of breast: Secondary | ICD-10-CM

## 2017-06-16 ENCOUNTER — Encounter

## 2017-06-25 ENCOUNTER — Inpatient Hospital Stay: Admit: 2017-06-25 | Payer: MEDICARE | Attending: "Endocrinology | Primary: Internal Medicine

## 2017-06-25 DIAGNOSIS — E213 Hyperparathyroidism, unspecified: Secondary | ICD-10-CM

## 2017-06-25 MED ORDER — TECHNETIUM TC 99M SESTAMIBI - CARDIOLITE
Freq: Once | Status: AC
Start: 2017-06-25 — End: 2017-06-25
  Administered 2017-06-25: 15:00:00 via INTRAVENOUS

## 2017-10-28 ENCOUNTER — Ambulatory Visit: Attending: Surgery | Primary: Internal Medicine

## 2017-10-28 ENCOUNTER — Ambulatory Visit: Admit: 2017-10-28 | Discharge: 2017-10-28 | Payer: MEDICARE | Attending: Surgery | Primary: Internal Medicine

## 2017-10-28 DIAGNOSIS — E210 Primary hyperparathyroidism: Secondary | ICD-10-CM

## 2017-10-28 NOTE — Progress Notes (Signed)
Progress Note    Patient: Kathryn [name]  MRN: [id number]  SSN: [id number]   Date of Birth: May 29, 1940  Age: 77 y.o.  Sex: female     Chief Complaint   Patient presents with   ??? Thyroid Problem     referred by Dr. Rush Barer for parathyroid adema       HPI    Kathryn [name] is a 77 year old woman who presents from Dr. Cannon Kettle with a calcium of 11.1 and a PTH of 214.  She has no symptoms and is never had any stones.  She denies bone pain, abdominal pain, and psychiatric illness.  She is otherwise relatively healthy she has had an implantable cardioverter and has a history of heart failure in the past with coronary artery disease.  She also has had a left breast cancer.  I have reviewed the images of her sestamibi which seemed indicate a right lower gland adenoma.  I have spent time answering all their questions and describing the pathophysiology of primary hyperparathyroidism as well as the feedback loop for calcium.  Past Medical History:   Diagnosis Date   ??? Arrhythmia    ??? Arthritis    ??? CAD (coronary artery disease)    ??? Cancer ([place])     Left breast   ??? Diabetes ([place])    ??? GERD (gastroesophageal reflux disease)    ??? Heart failure ([place])    ??? Hypertension    ??? 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 BREAST SURGERY PROCEDURE UNLISTED      Left Mastectomy   ??? COLONOSCOPY N/A 10/22/2016    COLONOSCOPY, SURVEILLANCE performed by Jackalyn Lombard, MD at [facility]   ??? HX GYN      Hysterectomy   ??? HX HEENT      Tosillectomy   ??? HX PACEMAKER  2010    [facility]     Allergies   Allergen Reactions   ??? Pcn [Penicillins] Itching    ??? Sulfa (Sulfonamide Antibiotics) Other (comments)     CAN'T REMEMBER     Current Outpatient Medications   Medication Sig Dispense Refill   ??? citalopram (CELEXA) 20 mg tablet Take 20 mg by mouth daily.     ??? allopurinol (ZYLOPRIM) 100 mg tablet Take 100 mg by mouth daily.     ??? losartan (COZAAR) 25 mg tablet Take 25 mg by mouth daily.     ??? furosemide (LASIX) 20 mg tablet Take 20 mg by mouth daily.     ??? metFORMIN (GLUCOPHAGE) 500 mg tablet Take 500 mg by mouth daily (with breakfast).     ??? carvedilol (COREG) 3.125 mg tablet Take 3.125 mg by mouth two (2) times a day. 2TABS IN AM, 1 IN THE EVENING     ??? spironolactone (ALDACTONE) 25 mg tablet Take 25 mg by mouth daily. 1/2 TAB DAILY     ??? simvastatin (ZOCOR) 20 mg tablet Take 20 mg by mouth nightly.     ??? Cholecalciferol, Vitamin D3, 5,000 unit Tab Take  by mouth daily.     ??? aspirin 81 mg tablet Take 81 mg by mouth  daily.     ??? gabapentin (NEURONTIN) 300 mg tablet Take 300 mg by mouth daily.       Social History     Socioeconomic History   ??? Marital status: MARRIED     Spouse name: Not on file   ??? Number of children: Not on file   ??? Years of education: Not on file   ??? Highest education level: Not on file   Occupational History   ??? Not on file   Social Needs   ??? Financial resource strain: Not on file   ??? Food insecurity:     Worry: Not on file     Inability: Not on file   ??? Transportation needs:     Medical: Not on file     Non-medical: Not on file   Tobacco Use   ??? Smoking status: Former Smoker     Packs/day: 0.25     Years: 0.50     Pack years: 0.12     Last attempt to quit: 07/01/1967     Years since quitting: 50.3   ??? Smokeless tobacco: Never Used   Substance and Sexual Activity   ??? Alcohol use: No   ??? Drug use: No   ??? Sexual activity: Not on file   Lifestyle   ??? Physical activity:     Days per week: Not on file     Minutes per session: Not on file   ??? Stress: Not on file   Relationships   ??? Social connections:     Talks on phone: Not on file      Gets together: Not on file     Attends religious service: Not on file     Active member of club or organization: Not on file     Attends meetings of clubs or organizations: Not on file     Relationship status: Not on file   ??? Intimate partner violence:     Fear of current or ex partner: Not on file     Emotionally abused: Not on file     Physically abused: Not on file     Forced sexual activity: Not on file   Other Topics Concern   ??? Not on file   Social History Narrative   ??? Not on file     Family History   Problem Relation Age of Onset   ??? Diabetes Mother    ??? Hypertension Mother    ??? Diabetes Father    ??? Hypertension Father    ??? Stroke Father          Review of systems:  Patient denies any reflux, emesis, abdominal pain, change in bowel habits, hematochezia, melena, fever, weight loss, fatigue chills, dermatitis, abnormal moles, change in vision, vertigo, epistaxis, dysphagia, hoarseness, chest pain, palpitations, hypertension, edema, cough, shortness of breath, wheezing, hemoptysis, snoring, hematuria, diabetes, thyroid disease, anemia, bruising, history of blood transfusion, dizziness, headache, or fainting.    Physical Examination    Well developed well nourished female in no apparent distress  Visit Vitals  BP 133/77   Pulse 83   Temp 96.3 ??F (35.7 ??C) (Oral)   Resp 18   Ht 5\' 4"  (1.626 m)   Wt 71.7 kg (158 lb)   SpO2 95%   BMI 27.12 kg/m??      Head: normocephalic, atraumatic  Mouth: Clear, no overt lesions, oral mucosa pink and moist  Neck: supple, no masses, no adenopathy or carotid bruits, trachea midline  Resp: clear  to auscultation bilaterally, no wheeze, rhonchi or rales, excursions normal and symmetrical  Cardio: Regular rate and rhythm, no murmurs, clicks, gallops or rubs, no edema or varicosities  Abdomen: soft, nontender, nondistended, normoactive bowel sounds, no hernias, no hepatosplenomegaly,   Back: Deferred  Extremeties: warm, well-perfused, no tenderness or swelling, normal gait/station   Neuro: sensation and strength grossly intact and symmetrical  Psych: alert and oriented to person, place and time  Breast exam deferred    IMPRESSION  Primary hyperparathyroidism with right lower parathyroid adenoma    PLAN  Orders Placed This Encounter   ??? CT NECK SOFT TISSUE W CONT     Standing Status:   Future     Standing Expiration Date:   02/27/2018     Order Specific Question:   Is Patient Allergic to Contrast Dye?     Answer:   No   ??? CALCIUM, UR, 24 HR     Standing Status:   Future     Standing Expiration Date:   10/29/2018     Soft tissue CT of the neck and 24-hour urine calcium  Horald Chestnut, MD

## 2017-10-28 NOTE — Progress Notes (Signed)
Chief Complaint   Patient presents with   ??? Thyroid Problem     referred by Dr. Rush Barer for parathyroid adema     Pt states high level of calcium and always fatigued. Also c/o swelling.

## 2017-10-28 NOTE — Progress Notes (Signed)
 Chief Complaint   Patient presents with   . Thyroid Problem     referred by Dr. Layton for parathyroid adema     Pt states high level of calcium and always fatigued. Also c/o swelling.

## 2017-10-28 NOTE — Progress Notes (Signed)
Progress Note    Patient: Kathryn [name]  MRN: [id number]  SSN: [id number]   Date of Birth: 08-Oct-1940  Age: 77 y.o.  Sex: female     Chief Complaint   Patient presents with   ??? Thyroid Problem     referred by Dr. Rush Barer for parathyroid adema       HPI    Kathryn [name] is a 77 year old woman who presents from Dr. Cannon Kettle with a calcium of 11.1 and a PTH of 214.  She has no symptoms and is never had any stones.  She denies bone pain, abdominal pain, and psychiatric illness.  She is otherwise relatively healthy she has had an implantable cardioverter and has a history of heart failure in the past with coronary artery disease.  She also has had a left breast cancer.  I have reviewed the images of her sestamibi which seemed indicate a right lower gland adenoma.  I have spent time answering all their questions and describing the pathophysiology of primary hyperparathyroidism as well as the feedback loop for calcium.  Past Medical History:   Diagnosis Date   ??? Arrhythmia    ??? Arthritis    ??? CAD (coronary artery disease)    ??? Cancer ([place])     Left breast   ??? Diabetes ([place])    ??? GERD (gastroesophageal reflux disease)    ??? Heart failure ([place])    ??? Hypertension    ??? ICD (implantable cardioverter-defibrillator) battery depletion 06/28/2014    Implants: EXPLANTED DEVICE ST JUDE [id number] DOI 06/12/08 NEW DEVICE ST JUDE UNIFY ASSURA [id number] RA LEAD ST [place] XT:[id number] RV LEAD ST JUDE 7122Q HG:[id number] LV LEAD 1156T MH:[id number] PROGRAMMED DDD 60-130 VT 1 150 MONITOR VT 2 171 ATPX2,30J,40J VF 200 ATP,30J,40J     Past Surgical History:   Procedure Laterality Date   ??? BREAST SURGERY PROCEDURE UNLISTED      Left Mastectomy   ??? COLONOSCOPY N/A 10/22/2016    COLONOSCOPY, SURVEILLANCE performed by Jackalyn Lombard, MD at [place]   ??? HX GYN      Hysterectomy   ??? HX HEENT      Tosillectomy   ??? HX PACEMAKER  2010    [facility]     Allergies   Allergen Reactions   ??? Pcn [Penicillins] Itching   ??? Sulfa  (Sulfonamide Antibiotics) Other (comments)     CAN'T REMEMBER     Current Outpatient Medications   Medication Sig Dispense Refill   ??? citalopram (CELEXA) 20 mg tablet Take 20 mg by mouth daily.     ??? allopurinol (ZYLOPRIM) 100 mg tablet Take 100 mg by mouth daily.     ??? losartan (COZAAR) 25 mg tablet Take 25 mg by mouth daily.     ??? furosemide (LASIX) 20 mg tablet Take 20 mg by mouth daily.     ??? metFORMIN (GLUCOPHAGE) 500 mg tablet Take 500 mg by mouth daily (with breakfast).     ??? carvedilol (COREG) 3.125 mg tablet Take 3.125 mg by mouth two (2) times a day. 2TABS IN AM, 1 IN THE EVENING     ??? spironolactone (ALDACTONE) 25 mg tablet Take 25 mg by mouth daily. 1/2 TAB DAILY     ??? simvastatin (ZOCOR) 20 mg tablet Take 20 mg by mouth nightly.     ??? Cholecalciferol, Vitamin D3, 5,000 unit Tab Take  by mouth daily.     ??? aspirin 81 mg tablet Take 81 mg by mouth  daily.     ??? gabapentin (NEURONTIN) 300 mg tablet Take 300 mg by mouth daily.       Social History     Socioeconomic History   ??? Marital status: MARRIED     Spouse name: Not on file   ??? Number of children: Not on file   ??? Years of education: Not on file   ??? Highest education level: Not on file   Occupational History   ??? Not on file   Social Needs   ??? Financial resource strain: Not on file   ??? Food insecurity:     Worry: Not on file     Inability: Not on file   ??? Transportation needs:     Medical: Not on file     Non-medical: Not on file   Tobacco Use   ??? Smoking status: Former Smoker     Packs/day: 0.25     Years: 0.50     Pack years: 0.12     Last attempt to quit: 07/01/1967     Years since quitting: 50.3   ??? Smokeless tobacco: Never Used   Substance and Sexual Activity   ??? Alcohol use: No   ??? Drug use: No   ??? Sexual activity: Not on file   Lifestyle   ??? Physical activity:     Days per week: Not on file     Minutes per session: Not on file   ??? Stress: Not on file   Relationships   ??? Social connections:     Talks on phone: Not on file     Gets together: Not on  file     Attends religious service: Not on file     Active member of club or organization: Not on file     Attends meetings of clubs or organizations: Not on file     Relationship status: Not on file   ??? Intimate partner violence:     Fear of current or ex partner: Not on file     Emotionally abused: Not on file     Physically abused: Not on file     Forced sexual activity: Not on file   Other Topics Concern   ??? Not on file   Social History Narrative   ??? Not on file     Family History   Problem Relation Age of Onset   ??? Diabetes Mother    ??? Hypertension Mother    ??? Diabetes Father    ??? Hypertension Father    ??? Stroke Father          Review of systems:  Patient denies any reflux, emesis, abdominal pain, change in bowel habits, hematochezia, melena, fever, weight loss, fatigue chills, dermatitis, abnormal moles, change in vision, vertigo, epistaxis, dysphagia, hoarseness, chest pain, palpitations, hypertension, edema, cough, shortness of breath, wheezing, hemoptysis, snoring, hematuria, diabetes, thyroid disease, anemia, bruising, history of blood transfusion, dizziness, headache, or fainting.    Physical Examination    Well developed well nourished female in no apparent distress  Visit Vitals  BP 133/77   Pulse 83   Temp 96.3 ??F (35.7 ??C) (Oral)   Resp 18   Ht 5\' 4"  (1.626 m)   Wt 71.7 kg (158 lb)   SpO2 95%   BMI 27.12 kg/m??      Head: normocephalic, atraumatic  Mouth: Clear, no overt lesions, oral mucosa pink and moist  Neck: supple, no masses, no adenopathy or carotid bruits, trachea midline  Resp: clear  to auscultation bilaterally, no wheeze, rhonchi or rales, excursions normal and symmetrical  Cardio: Regular rate and rhythm, no murmurs, clicks, gallops or rubs, no edema or varicosities  Abdomen: soft, nontender, nondistended, normoactive bowel sounds, no hernias, no hepatosplenomegaly,   Back: Deferred  Extremeties: warm, well-perfused, no tenderness or swelling, normal gait/station  Neuro: sensation and  strength grossly intact and symmetrical  Psych: alert and oriented to person, place and time  Breast exam deferred    IMPRESSION  Primary hyperparathyroidism with right lower parathyroid adenoma    PLAN  Orders Placed This Encounter   ??? CT NECK SOFT TISSUE W CONT     Standing Status:   Future     Standing Expiration Date:   02/27/2018     Order Specific Question:   Is Patient Allergic to Contrast Dye?     Answer:   No   ??? CALCIUM, UR, 24 HR     Standing Status:   Future     Standing Expiration Date:   10/29/2018     Soft tissue CT of the neck and 24-hour urine calcium  Horald Chestnut, MD

## 2017-11-10 ENCOUNTER — Inpatient Hospital Stay: Admit: 2017-11-10 | Payer: MEDICARE | Attending: Surgery | Primary: Internal Medicine

## 2017-11-10 ENCOUNTER — Inpatient Hospital Stay: Admit: 2017-11-11 | Payer: MEDICARE | Primary: Internal Medicine

## 2017-11-10 DIAGNOSIS — E210 Primary hyperparathyroidism: Secondary | ICD-10-CM

## 2017-11-10 MED ORDER — IOPAMIDOL 61 % IV SOLN
300 mg iodine /mL (61 %) | Freq: Once | INTRAVENOUS | Status: AC
Start: 2017-11-10 — End: 2017-11-10
  Administered 2017-11-10: 14:00:00 via INTRAVENOUS

## 2017-11-10 MED FILL — ISOVUE-300  61 % INTRAVENOUS SOLUTION: 300 mg iodine /mL (61 %) | INTRAVENOUS | Qty: 80

## 2017-11-11 LAB — AMB POC CREATININE
Creatinine, POC: 1.2 MG/DL (ref 0.6–1.3)
GFR African American: 53 mL/min/{1.73_m2} — ABNORMAL LOW (ref >60)
GFR Non-African American: 44 mL/min/{1.73_m2} — ABNORMAL LOW (ref >60)

## 2017-11-11 LAB — CREATININE, POC
Creatinine, POC: 1.2 MG/DL (ref 0.6–1.3)
GFRAA, POC: 53 mL/min/{1.73_m2} — ABNORMAL LOW (ref >60)
GFRNA, POC: 44 mL/min/{1.73_m2} — ABNORMAL LOW (ref >60)

## 2017-11-12 LAB — CALCIUM, UR, 24 HR
Calcium, 24 Hours, Urine: 104 mg/24 hr — ABNORMAL LOW (ref 142–353)
Calcium,urine 24 hr: 104 mg/24 hr — ABNORMAL LOW (ref 142–353)
Period Of Collection: 24 hr
Period of collection: 24 hr
Volume: 1800 mL
Volume: 1800 mL

## 2017-12-09 ENCOUNTER — Ambulatory Visit: Attending: Surgery | Primary: Internal Medicine

## 2017-12-09 ENCOUNTER — Inpatient Hospital Stay: Admit: 2017-12-09 | Payer: MEDICARE | Primary: Internal Medicine

## 2017-12-09 ENCOUNTER — Ambulatory Visit: Admit: 2017-12-09 | Discharge: 2017-12-09 | Payer: MEDICARE | Attending: Surgery | Primary: Internal Medicine

## 2017-12-09 DIAGNOSIS — E210 Primary hyperparathyroidism: Secondary | ICD-10-CM

## 2017-12-09 LAB — PTH, INTACT
Calcium: 13.5 MG/DL (ref 8.5–10.1)
PTH: 387 pg/mL — ABNORMAL HIGH (ref 18.4–88.0)

## 2017-12-09 LAB — CALCIUM
Calcium: 13.3 MG/DL (ref 8.5–10.1)
Calcium: 13.3 MG/DL — CR (ref 8.5–10.1)

## 2017-12-09 LAB — PTH INTACT
Calcium: 13.5 MG/DL — CR (ref 8.5–10.1)
PTH, Intact: 387 pg/mL — ABNORMAL HIGH (ref 18.4–88.0)

## 2017-12-09 NOTE — Progress Notes (Signed)
Progress Note    Patient: Kathryn [name]  MRN: [id number]  SSN: [id number]   Date of Birth: Jun 03, 1940  Age: 77 y.o.  Sex: female     Chief Complaint   Patient presents with   ??? Follow-up     CT scan       HPI    Kathryn [name] is a 77 year old woman I am seen before with primary hyperparathyroidism.  She had a calcium back in May 2011 0.1 and a PTH of 214.  She had a sestamibi that showed a right sided lesion although it is thought to be inferior was difficult to tell.  She is now had a CT scan that shows what I think is an adenoma in the right lower position however it is read as normal.  I will discuss this with radiology.  She remains symptom-free.    Past Medical History:   Diagnosis Date   ??? Arrhythmia    ??? Arthritis    ??? CAD (coronary artery disease)    ??? Cancer ([place])     Left breast   ??? Diabetes ([place])    ??? GERD (gastroesophageal reflux disease)    ??? Heart failure ([place])    ??? Hypertension    ??? ICD (implantable cardioverter-defibrillator) battery depletion 06/28/2014    Implants: EXPLANTED DEVICE ST JUDE [id number] DOI 06/12/08 NEW DEVICE ST JUDE UNIFY ASSURA [id number] RA LEAD ST  CW:[id number] RV LEAD ST JUDE 7122Q [id number] LV LEAD 1156T [id number] PROGRAMMED DDD 60-130 VT 1 150 MONITOR VT 2 171 ATPX2,30J,40J VF 200 ATP,30J,40J     Past Surgical History:   Procedure Laterality Date   ??? BREAST SURGERY PROCEDURE UNLISTED      Left Mastectomy   ??? COLONOSCOPY N/A 10/22/2016    COLONOSCOPY, SURVEILLANCE performed by Kathryn Lombard, MD at [place]   ??? HX GYN      Hysterectomy   ??? HX HEENT      Tosillectomy   ??? HX PACEMAKER  2010    [facility]     Allergies   Allergen Reactions   ??? Pcn [Penicillins] Itching   ??? Sulfa (Sulfonamide Antibiotics) Other (comments)     CAN'T REMEMBER     Current Outpatient Medications   Medication Sig Dispense Refill   ??? citalopram (CELEXA) 20 mg tablet Take 20 mg by mouth daily.     ??? allopurinol (ZYLOPRIM) 100 mg tablet Take 100 mg by mouth daily.      ??? losartan (COZAAR) 25 mg tablet Take 25 mg by mouth daily.     ??? furosemide (LASIX) 20 mg tablet Take 20 mg by mouth daily.     ??? gabapentin (NEURONTIN) 300 mg tablet Take 300 mg by mouth daily.     ??? metFORMIN (GLUCOPHAGE) 500 mg tablet Take 500 mg by mouth daily (with breakfast).     ??? carvedilol (COREG) 3.125 mg tablet Take 3.125 mg by mouth two (2) times a day. 2TABS IN AM, 1 IN THE EVENING     ??? spironolactone (ALDACTONE) 25 mg tablet Take 25 mg by mouth daily. 1/2 TAB DAILY     ??? simvastatin (ZOCOR) 20 mg tablet Take 20 mg by mouth nightly.     ??? Cholecalciferol, Vitamin D3, 5,000 unit Tab Take  by mouth daily.     ??? aspirin 81 mg tablet Take 81 mg by mouth daily.       Social History     Socioeconomic History   ???  Marital status: MARRIED     Spouse name: Not on file   ??? Number of children: Not on file   ??? Years of education: Not on file   ??? Highest education level: Not on file   Occupational History   ??? Not on file   Social Needs   ??? Financial resource strain: Not on file   ??? Food insecurity:     Worry: Not on file     Inability: Not on file   ??? Transportation needs:     Medical: Not on file     Non-medical: Not on file   Tobacco Use   ??? Smoking status: Former Smoker     Packs/day: 0.25     Years: 0.50     Pack years: 0.12     Last attempt to quit: 07/01/1967     Years since quitting: 50.4   ??? Smokeless tobacco: Never Used   Substance and Sexual Activity   ??? Alcohol use: No   ??? Drug use: No   ??? Sexual activity: Not on file   Lifestyle   ??? Physical activity:     Days per week: Not on file     Minutes per session: Not on file   ??? Stress: Not on file   Relationships   ??? Social connections:     Talks on phone: Not on file     Gets together: Not on file     Attends religious service: Not on file     Active member of club or organization: Not on file     Attends meetings of clubs or organizations: Not on file     Relationship status: Not on file   ??? Intimate partner violence:      Fear of current or ex partner: Not on file     Emotionally abused: Not on file     Physically abused: Not on file     Forced sexual activity: Not on file   Other Topics Concern   ??? Not on file   Social History Narrative   ??? Not on file     Family History   Problem Relation Age of Onset   ??? Diabetes Mother    ??? Hypertension Mother    ??? Diabetes Father    ??? Hypertension Father    ??? Stroke Father          Review of systems:  Patient denies any reflux, emesis, abdominal pain, change in bowel habits, hematochezia, melena, fever, weight loss, fatigue chills, dermatitis, abnormal moles, change in vision, vertigo, epistaxis, dysphagia, hoarseness, chest pain, palpitations, hypertension, edema, cough, shortness of breath, wheezing, hemoptysis, snoring, hematuria, diabetes, thyroid disease, anemia, bruising, history of blood transfusion, dizziness, headache, or fainting.    Physical Examination    Well developed well nourished female in no apparent distress  Visit Vitals  Resp 16   Ht 5\' 4"  (1.626 m)   Wt 71.7 kg (158 lb)   BMI 27.12 kg/m??      Head: normocephalic, atraumatic  Mouth: Clear, no overt lesions, oral mucosa pink and moist  Neck: supple, no masses, no adenopathy or carotid bruits, trachea midline  Resp: clear to auscultation bilaterally, no wheeze, rhonchi or rales, excursions normal and symmetrical  Cardio: Regular rate and rhythm, no murmurs, clicks, gallops or rubs, no edema or varicosities  Abdomen: soft, nontender, nondistended, normoactive bowel sounds, no hernias, no hepatosplenomegaly,   Back: Deferred  Extremeties: warm, well-perfused, no tenderness or swelling, normal gait/station  Neuro: sensation and strength grossly intact and symmetrical  Psych: alert and oriented to person, place and time  Breast exam deferred    IMPRESSION  Primary hyperparathyroidism    PLAN  Orders Placed This Encounter   ??? CALCIUM     Standing Status:   Future     Standing Expiration Date:   12/10/2018   ??? PTH INTACT      Standing Status:   Future     Standing Expiration Date:   12/10/2018     Discussed with radiology her CT scan likely a right lower parathyroid adenoma  Horald Chestnut, MD

## 2017-12-09 NOTE — Progress Notes (Signed)
Chief Complaint   Patient presents with   ??? Follow-up     CT scan

## 2017-12-09 NOTE — Progress Notes (Signed)
Progress Note    Patient: Kathryn [name]  MRN: [id number]  SSN: [id number]   Date of Birth: 07-09-1940  Age: 77 y.o.  Sex: female     Chief Complaint   Patient presents with   ??? Follow-up     CT scan       HPI    Ms. Gillean is a 77 year old woman I am seen before with primary hyperparathyroidism.  She had a calcium back in May 2011 0.1 and a PTH of 214.  She had a sestamibi that showed a right sided lesion although it is thought to be inferior was difficult to tell.  She is now had a CT scan that shows what I think is an adenoma in the right lower position however it is read as normal.  I will discuss this with radiology.  She remains symptom-free.    Past Medical History:   Diagnosis Date   ??? Arrhythmia    ??? Arthritis    ??? CAD (coronary artery disease)    ??? Cancer ([place])     Left breast   ??? Diabetes ([place])    ??? GERD (gastroesophageal reflux disease)    ??? Heart failure ([place])    ??? Hypertension    ??? ICD (implantable cardioverter-defibrillator) battery depletion 06/28/2014    Implants: EXPLANTED DEVICE ST JUDE [id number] DOI 06/12/08 NEW DEVICE ST JUDE UNIFY ASSURA [id number] RA LEAD ST [place] RW:[id number] RV LEAD ST JUDE 7122Q QP:[id number] LV LEAD 1156T IZ:[id number] PROGRAMMED DDD 60-130 VT 1 150 MONITOR VT 2 171 ATPX2,30J,40J VF 200 ATP,30J,40J     Past Surgical History:   Procedure Laterality Date   ??? BREAST SURGERY PROCEDURE UNLISTED      Left Mastectomy   ??? COLONOSCOPY N/A 10/22/2016    COLONOSCOPY, SURVEILLANCE performed by Jackalyn Lombard, MD at [place]   ??? HX GYN      Hysterectomy   ??? HX HEENT      Tosillectomy   ??? HX PACEMAKER  2010    [facility]     Allergies   Allergen Reactions   ??? Pcn [Penicillins] Itching   ??? Sulfa (Sulfonamide Antibiotics) Other (comments)     CAN'T REMEMBER     Current Outpatient Medications   Medication Sig Dispense Refill   ??? citalopram (CELEXA) 20 mg tablet Take 20 mg by mouth daily.     ??? allopurinol (ZYLOPRIM) 100 mg tablet Take 100 mg by mouth daily.     ???  losartan (COZAAR) 25 mg tablet Take 25 mg by mouth daily.     ??? furosemide (LASIX) 20 mg tablet Take 20 mg by mouth daily.     ??? gabapentin (NEURONTIN) 300 mg tablet Take 300 mg by mouth daily.     ??? metFORMIN (GLUCOPHAGE) 500 mg tablet Take 500 mg by mouth daily (with breakfast).     ??? carvedilol (COREG) 3.125 mg tablet Take 3.125 mg by mouth two (2) times a day. 2TABS IN AM, 1 IN THE EVENING     ??? spironolactone (ALDACTONE) 25 mg tablet Take 25 mg by mouth daily. 1/2 TAB DAILY     ??? simvastatin (ZOCOR) 20 mg tablet Take 20 mg by mouth nightly.     ??? Cholecalciferol, Vitamin D3, 5,000 unit Tab Take  by mouth daily.     ??? aspirin 81 mg tablet Take 81 mg by mouth daily.       Social History     Socioeconomic History   ???  Marital status: MARRIED     Spouse name: Not on file   ??? Number of children: Not on file   ??? Years of education: Not on file   ??? Highest education level: Not on file   Occupational History   ??? Not on file   Social Needs   ??? Financial resource strain: Not on file   ??? Food insecurity:     Worry: Not on file     Inability: Not on file   ??? Transportation needs:     Medical: Not on file     Non-medical: Not on file   Tobacco Use   ??? Smoking status: Former Smoker     Packs/day: 0.25     Years: 0.50     Pack years: 0.12     Last attempt to quit: 07/01/1967     Years since quitting: 50.4   ??? Smokeless tobacco: Never Used   Substance and Sexual Activity   ??? Alcohol use: No   ??? Drug use: No   ??? Sexual activity: Not on file   Lifestyle   ??? Physical activity:     Days per week: Not on file     Minutes per session: Not on file   ??? Stress: Not on file   Relationships   ??? Social connections:     Talks on phone: Not on file     Gets together: Not on file     Attends religious service: Not on file     Active member of club or organization: Not on file     Attends meetings of clubs or organizations: Not on file     Relationship status: Not on file   ??? Intimate partner violence:     Fear of current or ex partner: Not on  file     Emotionally abused: Not on file     Physically abused: Not on file     Forced sexual activity: Not on file   Other Topics Concern   ??? Not on file   Social History Narrative   ??? Not on file     Family History   Problem Relation Age of Onset   ??? Diabetes Mother    ??? Hypertension Mother    ??? Diabetes Father    ??? Hypertension Father    ??? Stroke Father          Review of systems:  Patient denies any reflux, emesis, abdominal pain, change in bowel habits, hematochezia, melena, fever, weight loss, fatigue chills, dermatitis, abnormal moles, change in vision, vertigo, epistaxis, dysphagia, hoarseness, chest pain, palpitations, hypertension, edema, cough, shortness of breath, wheezing, hemoptysis, snoring, hematuria, diabetes, thyroid disease, anemia, bruising, history of blood transfusion, dizziness, headache, or fainting.    Physical Examination    Well developed well nourished female in no apparent distress  Visit Vitals  Resp 16   Ht 5\' 4"  (1.626 m)   Wt 71.7 kg (158 lb)   BMI 27.12 kg/m??      Head: normocephalic, atraumatic  Mouth: Clear, no overt lesions, oral mucosa pink and moist  Neck: supple, no masses, no adenopathy or carotid bruits, trachea midline  Resp: clear to auscultation bilaterally, no wheeze, rhonchi or rales, excursions normal and symmetrical  Cardio: Regular rate and rhythm, no murmurs, clicks, gallops or rubs, no edema or varicosities  Abdomen: soft, nontender, nondistended, normoactive bowel sounds, no hernias, no hepatosplenomegaly,   Back: Deferred  Extremeties: warm, well-perfused, no tenderness or swelling, normal gait/station  Neuro: sensation and strength grossly intact and symmetrical  Psych: alert and oriented to person, place and time  Breast exam deferred    IMPRESSION  Primary hyperparathyroidism    PLAN  Orders Placed This Encounter   ??? CALCIUM     Standing Status:   Future     Standing Expiration Date:   12/10/2018   ??? PTH INTACT     Standing Status:   Future     Standing  Expiration Date:   12/10/2018     Discussed with radiology her CT scan likely a right lower parathyroid adenoma  Horald Chestnut, MD

## 2017-12-09 NOTE — Progress Notes (Signed)
Chief Complaint   Patient presents with   . Follow-up     CT scan

## 2017-12-21 ENCOUNTER — Ambulatory Visit: Attending: Surgery | Primary: Internal Medicine

## 2017-12-21 ENCOUNTER — Inpatient Hospital Stay: Admit: 2017-12-21 | Payer: MEDICARE | Primary: Internal Medicine

## 2017-12-21 ENCOUNTER — Encounter

## 2017-12-21 ENCOUNTER — Ambulatory Visit: Admit: 2017-12-21 | Discharge: 2017-12-21 | Payer: MEDICARE | Attending: Surgery | Primary: Internal Medicine

## 2017-12-21 ENCOUNTER — Encounter: Primary: Internal Medicine

## 2017-12-21 DIAGNOSIS — E210 Primary hyperparathyroidism: Secondary | ICD-10-CM

## 2017-12-21 LAB — CBC WITH AUTO DIFFERENTIAL
Basophils %: 1 % (ref 0–2)
Basophils Absolute: 0 10*3/uL (ref 0.0–0.1)
Eosinophils %: 5 % (ref 0–5)
Eosinophils Absolute: 0.3 10*3/uL (ref 0.0–0.4)
Hematocrit: 38.3 % (ref 35.0–45.0)
Hemoglobin: 12.7 g/dL (ref 12.0–16.0)
Lymphocytes %: 38 % (ref 21–52)
Lymphocytes Absolute: 2.3 10*3/uL (ref 0.9–3.6)
MCH: 30.6 PG (ref 24.0–34.0)
MCHC: 33.2 g/dL (ref 31.0–37.0)
MCV: 92.3 FL (ref 74.0–97.0)
MPV: 9.2 FL (ref 9.2–11.8)
Monocytes %: 7 % (ref 3–10)
Monocytes Absolute: 0.4 10*3/uL (ref 0.05–1.2)
Neutrophils %: 49 % (ref 40–73)
Neutrophils Absolute: 2.9 10*3/uL (ref 1.8–8.0)
Platelets: 221 10*3/uL (ref 135–420)
RBC: 4.15 M/uL — ABNORMAL LOW (ref 4.20–5.30)
RDW: 13.6 % (ref 11.6–14.5)
WBC: 5.9 10*3/uL (ref 4.6–13.2)

## 2017-12-21 LAB — EKG 12-LEAD
Atrial Rate: 83 {beats}/min
P Axis: 62 degrees
P-R Interval: 120 ms
Q-T Interval: 382 ms
QRS Duration: 108 ms
QTc Calculation (Bazett): 448 ms
R Axis: 100 degrees
T Axis: 46 degrees
Ventricular Rate: 83 {beats}/min

## 2017-12-21 LAB — BASIC METABOLIC PANEL
Anion Gap: 7 mmol/L (ref 3.0–18)
BUN: 20 MG/DL — ABNORMAL HIGH (ref 7.0–18)
Bun/Cre Ratio: 15 (ref 12–20)
CO2: 27 mmol/L (ref 21–32)
Calcium: 13.2 MG/DL (ref 8.5–10.1)
Chloride: 105 mmol/L (ref 100–111)
Creatinine: 1.32 MG/DL — ABNORMAL HIGH (ref 0.6–1.3)
EGFR IF NonAfrican American: 39 mL/min/{1.73_m2} — ABNORMAL LOW (ref >60)
GFR African American: 47 mL/min/{1.73_m2} — ABNORMAL LOW (ref >60)
Glucose: 90 mg/dL (ref 74–99)
Potassium: 4.5 mmol/L (ref 3.5–5.5)
Sodium: 139 mmol/L (ref 136–145)

## 2017-12-21 LAB — METABOLIC PANEL, BASIC
Anion gap: 7 mmol/L (ref 3.0–18)
BUN/Creatinine ratio: 15 (ref 12–20)
BUN: 20 MG/DL — ABNORMAL HIGH (ref 7.0–18)
CO2: 27 mmol/L (ref 21–32)
Calcium: 13.2 MG/DL — CR (ref 8.5–10.1)
Chloride: 105 mmol/L (ref 100–111)
Creatinine: 1.32 MG/DL — ABNORMAL HIGH (ref 0.6–1.3)
GFR est AA: 47 mL/min/{1.73_m2} — ABNORMAL LOW (ref >60)
GFR est non-AA: 39 mL/min/{1.73_m2} — ABNORMAL LOW (ref >60)
Glucose: 90 mg/dL (ref 74–99)
Potassium: 4.5 mmol/L (ref 3.5–5.5)
Sodium: 139 mmol/L (ref 136–145)

## 2017-12-21 LAB — CBC WITH AUTOMATED DIFF
ABS. BASOPHILS: 0 10*3/uL (ref 0.0–0.1)
ABS. EOSINOPHILS: 0.3 10*3/uL (ref 0.0–0.4)
ABS. LYMPHOCYTES: 2.3 10*3/uL (ref 0.9–3.6)
ABS. MONOCYTES: 0.4 10*3/uL (ref 0.05–1.2)
ABS. NEUTROPHILS: 2.9 10*3/uL (ref 1.8–8.0)
BASOPHILS: 1 % (ref 0–2)
EOSINOPHILS: 5 % (ref 0–5)
HCT: 38.3 % (ref 35.0–45.0)
HGB: 12.7 g/dL (ref 12.0–16.0)
LYMPHOCYTES: 38 % (ref 21–52)
MCH: 30.6 PG (ref 24.0–34.0)
MCHC: 33.2 g/dL (ref 31.0–37.0)
MCV: 92.3 FL (ref 74.0–97.0)
MONOCYTES: 7 % (ref 3–10)
MPV: 9.2 FL (ref 9.2–11.8)
NEUTROPHILS: 49 % (ref 40–73)
PLATELET: 221 10*3/uL (ref 135–420)
RBC: 4.15 M/uL — ABNORMAL LOW (ref 4.20–5.30)
RDW: 13.6 % (ref 11.6–14.5)
WBC: 5.9 10*3/uL (ref 4.6–13.2)

## 2017-12-21 LAB — EKG, 12 LEAD, INITIAL
Atrial Rate: 83 {beats}/min
Calculated P Axis: 62 degrees
Calculated R Axis: 100 degrees
Calculated T Axis: 46 degrees
P-R Interval: 120 ms
Q-T Interval: 382 ms
QRS Duration: 108 ms
QTC Calculation (Bezet): 448 ms
Ventricular Rate: 83 {beats}/min

## 2017-12-21 NOTE — H&P (View-Only) (Signed)
Progress Note    Patient: Kathryn [name]  MRN: [id number]  SSN: [id number]   Date of Birth: Sep 25, 1940  Age: 77 y.o.  Sex: female     Chief Complaint   Patient presents with   ??? Follow-up     parathyroid       HPI    Kathryn [name] is a 77 year old woman I have seen in the past for primary hyperparathyroidism.  A recent calcium was 13.5 and a PTH was 387.  She has a focus of hypermetabolism take parathyroid tissue at the right lower region and CT scan shows this is tracheoesophageal groove.  We spent a good bit of time discussing primary hyperparathyroidism and its manifestations, but the operations for it, and postoperative management.    Past Medical History:   Diagnosis Date   ??? Arrhythmia    ??? Arthritis    ??? CAD (coronary artery disease)    ??? Cancer ([place])     Left breast   ??? Diabetes ([place])    ??? GERD (gastroesophageal reflux disease)    ??? Heart failure ([place])    ??? Hypertension    ??? 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 BREAST SURGERY PROCEDURE UNLISTED      Left Mastectomy   ??? COLONOSCOPY N/A 10/22/2016    COLONOSCOPY, SURVEILLANCE performed by Jackalyn Lombard, MD at [facility]   ??? HX GYN      Hysterectomy   ??? HX HEENT      Tosillectomy   ??? HX PACEMAKER  2010    [facility]     Allergies   Allergen Reactions   ??? Pcn [Penicillins] Itching   ??? Sulfa (Sulfonamide Antibiotics) Other (comments)     CAN'T REMEMBER     Current Outpatient Medications   Medication Sig Dispense Refill   ??? citalopram (CELEXA) 20 mg tablet Take 20 mg by mouth daily.     ??? allopurinol (ZYLOPRIM) 100 mg tablet Take 100 mg by mouth daily.      ??? losartan (COZAAR) 25 mg tablet Take 25 mg by mouth daily.     ??? furosemide (LASIX) 20 mg tablet Take 20 mg by mouth daily.     ??? gabapentin (NEURONTIN) 300 mg tablet Take 300 mg by mouth daily.     ??? metFORMIN (GLUCOPHAGE) 500 mg tablet Take 500 mg by mouth daily (with breakfast).     ??? carvedilol (COREG) 3.125 mg tablet Take 3.125 mg by mouth two (2) times a day. 2TABS IN AM, 1 IN THE EVENING     ??? spironolactone (ALDACTONE) 25 mg tablet Take 25 mg by mouth daily. 1/2 TAB DAILY     ??? simvastatin (ZOCOR) 20 mg tablet Take 20 mg by mouth nightly.     ??? Cholecalciferol, Vitamin D3, 5,000 unit Tab Take  by mouth daily.     ??? aspirin 81 mg tablet Take 81 mg by mouth daily.       Social History     Socioeconomic History   ??? Marital status: MARRIED     Spouse name: Not on file   ??? Number of children:  Not on file   ??? Years of education: Not on file   ??? Highest education level: Not on file   Occupational History   ??? Not on file   Social Needs   ??? Financial resource strain: Not on file   ??? Food insecurity:     Worry: Not on file     Inability: Not on file   ??? Transportation needs:     Medical: Not on file     Non-medical: Not on file   Tobacco Use   ??? Smoking status: Former Smoker     Packs/day: 0.25     Years: 0.50     Pack years: 0.12     Last attempt to quit: 07/01/1967     Years since quitting: 50.5   ??? Smokeless tobacco: Never Used   Substance and Sexual Activity   ??? Alcohol use: No   ??? Drug use: No   ??? Sexual activity: Not on file   Lifestyle   ??? Physical activity:     Days per week: Not on file     Minutes per session: Not on file   ??? Stress: Not on file   Relationships   ??? Social connections:     Talks on phone: Not on file     Gets together: Not on file     Attends religious service: Not on file     Active member of club or organization: Not on file     Attends meetings of clubs or organizations: Not on file     Relationship status: Not on file   ??? Intimate partner violence:      Fear of current or ex partner: Not on file     Emotionally abused: Not on file     Physically abused: Not on file     Forced sexual activity: Not on file   Other Topics Concern   ??? Not on file   Social History Narrative   ??? Not on file     Family History   Problem Relation Age of Onset   ??? Diabetes Mother    ??? Hypertension Mother    ??? Diabetes Father    ??? Hypertension Father    ??? Stroke Father          Review of systems:  Patient denies any reflux, emesis, abdominal pain, change in bowel habits, hematochezia, melena, fever, weight loss, fatigue chills, dermatitis, abnormal moles, change in vision, vertigo, epistaxis, dysphagia, hoarseness, chest pain, palpitations, hypertension, edema, cough, shortness of breath, wheezing, hemoptysis, snoring, hematuria, diabetes, thyroid disease, anemia, bruising, history of blood transfusion, dizziness, headache, or fainting.    Physical Examination    Well developed well nourished female in no apparent distress  Visit Vitals  Resp 16   Ht 5\' 4"  (1.626 m)   Wt 71.7 kg (158 lb)   BMI 27.12 kg/m??      Head: normocephalic, atraumatic  Mouth: Clear, no overt lesions, oral mucosa pink and moist  Neck: supple, no masses, no adenopathy or carotid bruits, trachea midline  Resp: clear to auscultation bilaterally, no wheeze, rhonchi or rales, excursions normal and symmetrical  Cardio: Regular rate and rhythm, no murmurs, clicks, gallops or rubs, no edema or varicosities  Abdomen: soft, nontender, nondistended, normoactive bowel sounds, no hernias, no hepatosplenomegaly,   Back: Deferred  Extremeties: warm, well-perfused, no tenderness or swelling, normal gait/station  Neuro: sensation and strength grossly intact and symmetrical  Psych: alert and oriented to person, place and  time  Breast exam deferred    IMPRESSION  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    Resection of adenoma   Horald Chestnut, MD

## 2017-12-21 NOTE — Progress Notes (Signed)
Received critical lab result from [facility] lab. Notified Dr. Wylene Simmer of patients calcium level at 13.2. He is aware of this result.

## 2017-12-21 NOTE — Progress Notes (Signed)
Chief Complaint   Patient presents with   ??? Follow-up     parathyroid

## 2017-12-21 NOTE — Progress Notes (Signed)
Progress Note    Patient: Kathryn [name]  MRN: [id number]  SSN: [id number]   Date of Birth: [birth year]  Age: 77 y.o.  Sex: female     Chief Complaint   Patient presents with   ??? Follow-up     parathyroid       HPI    Kathryn [name] is a 77 year old woman I have seen in the past for primary hyperparathyroidism.  A recent calcium was 13.5 and a PTH was 387.  She has a focus of hypermetabolism take parathyroid tissue at the right lower region and CT scan shows this is tracheoesophageal groove.  We spent a good bit of time discussing primary hyperparathyroidism and its manifestations, but the operations for it, and postoperative management.    Past Medical History:   Diagnosis Date   ??? Arrhythmia    ??? Arthritis    ??? CAD (coronary artery disease)    ??? Cancer ([place])     Left breast   ??? Diabetes ([place])    ??? GERD (gastroesophageal reflux disease)    ??? Heart failure ([place])    ??? Hypertension    ??? 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 BREAST SURGERY PROCEDURE UNLISTED      Left Mastectomy   ??? COLONOSCOPY N/A 10/22/2016    COLONOSCOPY, SURVEILLANCE performed by Jackalyn Lombard, MD at [facility] [place]   ??? HX GYN      Hysterectomy   ??? HX HEENT      Tosillectomy   ??? HX PACEMAKER  2010    [facility]     Allergies   Allergen Reactions   ??? Pcn [Penicillins] Itching   ??? Sulfa (Sulfonamide Antibiotics) Other (comments)     CAN'T REMEMBER     Current Outpatient Medications   Medication Sig Dispense Refill   ??? citalopram (CELEXA) 20 mg tablet Take 20 mg by mouth daily.     ??? allopurinol (ZYLOPRIM) 100 mg tablet Take 100 mg by mouth daily.      ??? losartan (COZAAR) 25 mg tablet Take 25 mg by mouth daily.     ??? furosemide (LASIX) 20 mg tablet Take 20 mg by mouth daily.     ??? gabapentin (NEURONTIN) 300 mg tablet Take 300 mg by mouth daily.     ??? metFORMIN (GLUCOPHAGE) 500 mg tablet Take 500 mg by mouth daily (with breakfast).     ??? carvedilol (COREG) 3.125 mg tablet Take 3.125 mg by mouth two (2) times a day. 2TABS IN AM, 1 IN THE EVENING     ??? spironolactone (ALDACTONE) 25 mg tablet Take 25 mg by mouth daily. 1/2 TAB DAILY     ??? simvastatin (ZOCOR) 20 mg tablet Take 20 mg by mouth nightly.     ??? Cholecalciferol, Vitamin D3, 5,000 unit Tab Take  by mouth daily.     ??? aspirin 81 mg tablet Take 81 mg by mouth daily.       Social History     Socioeconomic History   ??? Marital status: MARRIED     Spouse name: Not on file   ??? Number of children:  Not on file   ??? Years of education: Not on file   ??? Highest education level: Not on file   Occupational History   ??? Not on file   Social Needs   ??? Financial resource strain: Not on file   ??? Food insecurity:     Worry: Not on file     Inability: Not on file   ??? Transportation needs:     Medical: Not on file     Non-medical: Not on file   Tobacco Use   ??? Smoking status: Former Smoker     Packs/day: 0.25     Years: 0.50     Pack years: 0.12     Last attempt to quit: 07/01/1967     Years since quitting: 50.5   ??? Smokeless tobacco: Never Used   Substance and Sexual Activity   ??? Alcohol use: No   ??? Drug use: No   ??? Sexual activity: Not on file   Lifestyle   ??? Physical activity:     Days per week: Not on file     Minutes per session: Not on file   ??? Stress: Not on file   Relationships   ??? Social connections:     Talks on phone: Not on file     Gets together: Not on file     Attends religious service: Not on file     Active member of club or organization: Not on file     Attends meetings of clubs or organizations: Not on file     Relationship status: Not on file   ??? Intimate partner violence:      Fear of current or ex partner: Not on file     Emotionally abused: Not on file     Physically abused: Not on file     Forced sexual activity: Not on file   Other Topics Concern   ??? Not on file   Social History Narrative   ??? Not on file     Family History   Problem Relation Age of Onset   ??? Diabetes Mother    ??? Hypertension Mother    ??? Diabetes Father    ??? Hypertension Father    ??? Stroke Father          Review of systems:  Patient denies any reflux, emesis, abdominal pain, change in bowel habits, hematochezia, melena, fever, weight loss, fatigue chills, dermatitis, abnormal moles, change in vision, vertigo, epistaxis, dysphagia, hoarseness, chest pain, palpitations, hypertension, edema, cough, shortness of breath, wheezing, hemoptysis, snoring, hematuria, diabetes, thyroid disease, anemia, bruising, history of blood transfusion, dizziness, headache, or fainting.    Physical Examination    Well developed well nourished female in no apparent distress  Visit Vitals  Resp 16   Ht 5\' 4"  (1.626 m)   Wt 71.7 kg (158 lb)   BMI 27.12 kg/m??      Head: normocephalic, atraumatic  Mouth: Clear, no overt lesions, oral mucosa pink and moist  Neck: supple, no masses, no adenopathy or carotid bruits, trachea midline  Resp: clear to auscultation bilaterally, no wheeze, rhonchi or rales, excursions normal and symmetrical  Cardio: Regular rate and rhythm, no murmurs, clicks, gallops or rubs, no edema or varicosities  Abdomen: soft, nontender, nondistended, normoactive bowel sounds, no hernias, no hepatosplenomegaly,   Back: Deferred  Extremeties: warm, well-perfused, no tenderness or swelling, normal gait/station  Neuro: sensation and strength grossly intact and symmetrical  Psych: alert and oriented to person, place and  time  Breast exam deferred    IMPRESSION  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    Resection of adenoma   Horald Chestnut, MD

## 2017-12-21 NOTE — Progress Notes (Signed)
Received critical lab result from [facility] lab. Notified Dr. Nelda Marseille of patients calcium level at 13.2. He is aware of this result.

## 2017-12-21 NOTE — Progress Notes (Signed)
Chief Complaint   Patient presents with   . Follow-up     parathyroid

## 2017-12-21 NOTE — Progress Notes (Signed)
Progress Note    Patient: Kathryn [name]  MRN: [id number]  SSN: [id number]   Date of Birth: April 22, 1940  Age: 77 y.o.  Sex: female     Chief Complaint   Patient presents with   ??? Follow-up     parathyroid       HPI    Kathryn [name] is a 77 year old woman I have seen in the past for primary hyperparathyroidism.  A recent calcium was 13.5 and a PTH was 387.  She has a focus of hypermetabolism take parathyroid tissue at the right lower region and CT scan shows this is tracheoesophageal groove.  We spent a good bit of time discussing primary hyperparathyroidism and its manifestations, but the operations for it, and postoperative management.    Past Medical History:   Diagnosis Date   ??? Arrhythmia    ??? Arthritis    ??? CAD (coronary artery disease)    ??? Cancer ([place])     Left breast   ??? Diabetes ([place])    ??? GERD (gastroesophageal reflux disease)    ??? Heart failure ([place])    ??? Hypertension    ??? 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 BREAST SURGERY PROCEDURE UNLISTED      Left Mastectomy   ??? COLONOSCOPY N/A 10/22/2016    COLONOSCOPY, SURVEILLANCE performed by Jackalyn Lombard, MD at [facility]   ??? HX GYN      Hysterectomy   ??? HX HEENT      Tosillectomy   ??? HX PACEMAKER  2010    [facility]     Allergies   Allergen Reactions   ??? Pcn [Penicillins] Itching   ??? Sulfa (Sulfonamide Antibiotics) Other (comments)     CAN'T REMEMBER     Current Outpatient Medications   Medication Sig Dispense Refill   ??? citalopram (CELEXA) 20 mg tablet Take 20 mg by mouth daily.     ??? allopurinol (ZYLOPRIM) 100 mg tablet Take 100 mg by mouth daily.     ??? losartan (COZAAR) 25  mg tablet Take 25 mg by mouth daily.     ??? furosemide (LASIX) 20 mg tablet Take 20 mg by mouth daily.     ??? gabapentin (NEURONTIN) 300 mg tablet Take 300 mg by mouth daily.     ??? metFORMIN (GLUCOPHAGE) 500 mg tablet Take 500 mg by mouth daily (with breakfast).     ??? carvedilol (COREG) 3.125 mg tablet Take 3.125 mg by mouth two (2) times a day. 2TABS IN AM, 1 IN THE EVENING     ??? spironolactone (ALDACTONE) 25 mg tablet Take 25 mg by mouth daily. 1/2 TAB DAILY     ??? simvastatin (ZOCOR) 20 mg tablet Take 20 mg by mouth nightly.     ??? Cholecalciferol, Vitamin D3, 5,000 unit Tab Take  by mouth daily.     ??? aspirin 81 mg tablet Take 81 mg by mouth daily.       Social History     Socioeconomic History   ??? Marital status: MARRIED     Spouse name: Not on file   ??? Number of children:  Not on file   ??? Years of education: Not on file   ??? Highest education level: Not on file   Occupational History   ??? Not on file   Social Needs   ??? Financial resource strain: Not on file   ??? Food insecurity:     Worry: Not on file     Inability: Not on file   ??? Transportation needs:     Medical: Not on file     Non-medical: Not on file   Tobacco Use   ??? Smoking status: Former Smoker     Packs/day: 0.25     Years: 0.50     Pack years: 0.12     Last attempt to quit: 07/01/1967     Years since quitting: 50.5   ??? Smokeless tobacco: Never Used   Substance and Sexual Activity   ??? Alcohol use: No   ??? Drug use: No   ??? Sexual activity: Not on file   Lifestyle   ??? Physical activity:     Days per week: Not on file     Minutes per session: Not on file   ??? Stress: Not on file   Relationships   ??? Social connections:     Talks on phone: Not on file     Gets together: Not on file     Attends religious service: Not on file     Active member of club or organization: Not on file     Attends meetings of clubs or organizations: Not on file     Relationship status: Not on file   ??? Intimate partner violence:     Fear of current or ex partner: Not on file     Emotionally  abused: Not on file     Physically abused: Not on file     Forced sexual activity: Not on file   Other Topics Concern   ??? Not on file   Social History Narrative   ??? Not on file     Family History   Problem Relation Age of Onset   ??? Diabetes Mother    ??? Hypertension Mother    ??? Diabetes Father    ??? Hypertension Father    ??? Stroke Father          Review of systems:  Patient denies any reflux, emesis, abdominal pain, change in bowel habits, hematochezia, melena, fever, weight loss, fatigue chills, dermatitis, abnormal moles, change in vision, vertigo, epistaxis, dysphagia, hoarseness, chest pain, palpitations, hypertension, edema, cough, shortness of breath, wheezing, hemoptysis, snoring, hematuria, diabetes, thyroid disease, anemia, bruising, history of blood transfusion, dizziness, headache, or fainting.    Physical Examination    Well developed well nourished female in no apparent distress  Visit Vitals  Resp 16   Ht 5\' 4"  (1.626 m)   Wt 71.7 kg (158 lb)   BMI 27.12 kg/m??      Head: normocephalic, atraumatic  Mouth: Clear, no overt lesions, oral mucosa pink and moist  Neck: supple, no masses, no adenopathy or carotid bruits, trachea midline  Resp: clear to auscultation bilaterally, no wheeze, rhonchi or rales, excursions normal and symmetrical  Cardio: Regular rate and rhythm, no murmurs, clicks, gallops or rubs, no edema or varicosities  Abdomen: soft, nontender, nondistended, normoactive bowel sounds, no hernias, no hepatosplenomegaly,   Back: Deferred  Extremeties: warm, well-perfused, no tenderness or swelling, normal gait/station  Neuro: sensation and strength grossly intact and symmetrical  Psych: alert and oriented to person, place and  time  Breast exam deferred    IMPRESSION  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    Resection of adenoma  Horald Chestnut, MD

## 2017-12-24 ENCOUNTER — Inpatient Hospital Stay
Admit: 2017-12-24 | Discharge: 2017-12-25 | Disposition: A | Discharge status: Home / Self Care | Payer: MEDICARE | Attending: Surgery | Admitting: Surgery

## 2017-12-24 ENCOUNTER — Ambulatory Visit: Admit: 2017-12-24 | Payer: MEDICARE | Primary: Internal Medicine

## 2017-12-24 DIAGNOSIS — D351 Benign neoplasm of parathyroid gland: Secondary | ICD-10-CM

## 2017-12-24 LAB — POCT GLUCOSE
POC Glucose: 98 mg/dL (ref 70–110)
POC Glucose: 116 mg/dL — ABNORMAL HIGH (ref 70–110)

## 2017-12-24 LAB — PTH, INTACT
Calcium: 12.6 MG/DL — ABNORMAL HIGH (ref 8.5–10.1)
Calcium: 12.4 MG/DL — ABNORMAL HIGH (ref 8.5–10.1)
PTH: 133.1 pg/mL — ABNORMAL HIGH (ref 18.4–88.0)
PTH: 582.4 pg/mL — ABNORMAL HIGH (ref 18.4–88.0)

## 2017-12-24 LAB — PTH INTACT
Calcium: 12.6 MG/DL — ABNORMAL HIGH (ref 8.5–10.1)
Calcium: 12.4 MG/DL — ABNORMAL HIGH (ref 8.5–10.1)
PTH, Intact: 133.1 pg/mL — ABNORMAL HIGH (ref 18.4–88.0)
PTH, Intact: 582.4 pg/mL — ABNORMAL HIGH (ref 18.4–88.0)

## 2017-12-24 LAB — GLUCOSE, POC
Glucose (POC): 98 mg/dL (ref 70–110)
Glucose (POC): 116 mg/dL — ABNORMAL HIGH (ref 70–110)

## 2017-12-24 MED ORDER — GLYCOPYRROLATE 0.2 MG/ML IJ SOLN
0.2 mg/mL | INTRAMUSCULAR | Status: AC
Start: 2017-12-24 — End: ?

## 2017-12-24 MED ORDER — TECHNETIUM TC 99M SESTAMIBI - CARDIOLITE
Freq: Once | Status: AC
Start: 2017-12-24 — End: 2017-12-24

## 2017-12-24 MED ORDER — ONDANSETRON (PF) 4 MG/2 ML INJECTION
4 mg/2 mL | INTRAMUSCULAR | Status: AC
Start: 2017-12-24 — End: ?

## 2017-12-24 MED ORDER — SUCCINYLCHOLINE CHLORIDE 100 MG/5 ML (20 MG/ML) IV SYRINGE
100 mg/5 mL (20 mg/mL) | INTRAVENOUS | Status: DC | PRN
Start: 2017-12-24 — End: 2017-12-24
  Administered 2017-12-24: 14:00:00 via INTRAVENOUS

## 2017-12-24 MED ORDER — ESMOLOL 10 MG/ML IV SOLN
100 mg/10 mL (10 mg/mL) | INTRAVENOUS | Status: AC
Start: 2017-12-24 — End: ?

## 2017-12-24 MED ORDER — LIDOCAINE (PF) 20 MG/ML (2 %) IJ SOLN
20 mg/mL (2 %) | INTRAMUSCULAR | Status: DC | PRN
Start: 2017-12-24 — End: 2017-12-24
  Administered 2017-12-24: 14:00:00 via INTRAVENOUS

## 2017-12-24 MED ORDER — LACTATED RINGERS IV
INTRAVENOUS | Status: DC
Start: 2017-12-24 — End: 2017-12-24
  Administered 2017-12-24 (×2): via INTRAVENOUS

## 2017-12-24 MED ORDER — ESMOLOL 10 MG/ML IV SOLN
100 mg/10 mL (10 mg/mL) | INTRAVENOUS | Status: DC | PRN
Start: 2017-12-24 — End: 2017-12-24
  Administered 2017-12-24 (×2): via INTRAVENOUS

## 2017-12-24 MED ORDER — DEXTROSE 50% IN WATER (D50W) IV SYRG
INTRAVENOUS | Status: DC | PRN
Start: 2017-12-24 — End: 2017-12-24

## 2017-12-24 MED ORDER — ONDANSETRON (PF) 4 MG/2 ML INJECTION
4 mg/2 mL | Freq: Once | INTRAMUSCULAR | Status: DC
Start: 2017-12-24 — End: 2017-12-24

## 2017-12-24 MED ORDER — FAMOTIDINE 20 MG TAB
20 mg | Freq: Once | ORAL | Status: AC
Start: 2017-12-24 — End: 2017-12-24
  Administered 2017-12-24: 12:00:00 via ORAL

## 2017-12-24 MED ORDER — NEOSTIGMINE METHYLSULFATE 3 MG/3 ML (1 MG/ML) IV SYRINGE
3 mg/ mL (1 mg/mL) | INTRAVENOUS | Status: AC
Start: 2017-12-24 — End: ?

## 2017-12-24 MED ORDER — FENTANYL CITRATE (PF) 50 MCG/ML IJ SOLN
50 mcg/mL | INTRAMUSCULAR | Status: AC
Start: 2017-12-24 — End: ?

## 2017-12-24 MED ORDER — GLYCOPYRROLATE 0.6 MG/3 ML (0.2 MG/ML) INTRAVENOUS SYRINGE
0.6 mg/3 mL (0.2 mg/mL) | INTRAVENOUS | Status: DC | PRN
Start: 2017-12-24 — End: 2017-12-24
  Administered 2017-12-24: 16:00:00 via INTRAVENOUS

## 2017-12-24 MED ORDER — FLU VACCINE QV 2019-20 (6 MOS+)(PF) 60 MCG (15 MCG X 4)/0.5 ML IM SYRINGE
60 mcg (15 mcg x 4)/0.5 mL | INTRAMUSCULAR | Status: AC
Start: 2017-12-24 — End: 2017-12-25
  Administered 2017-12-25: 15:00:00 via INTRAMUSCULAR

## 2017-12-24 MED ORDER — PHENYLEPHRINE 10 MG/ML INJECTION
10 mg/mL | INTRAMUSCULAR | Status: DC | PRN
Start: 2017-12-24 — End: 2017-12-24
  Administered 2017-12-24: 15:00:00 via INTRAVENOUS

## 2017-12-24 MED ORDER — SODIUM CHLORIDE 0.9 % IJ SYRG
INTRAMUSCULAR | Status: DC | PRN
Start: 2017-12-24 — End: 2017-12-24

## 2017-12-24 MED ORDER — LIDOCAINE (PF) 20 MG/ML (2 %) IJ SOLN
20 mg/mL (2 %) | INTRAMUSCULAR | Status: AC
Start: 2017-12-24 — End: ?

## 2017-12-24 MED ORDER — ONDANSETRON HCL (PF) 4 MG/2 ML SYRINGE
4 mg/2 mL | INTRAMUSCULAR | Status: DC | PRN
Start: 2017-12-24 — End: 2017-12-24
  Administered 2017-12-24: 15:00:00 via INTRAVENOUS

## 2017-12-24 MED ORDER — D5-1/2 NS & POTASSIUM CHLORIDE 20 MEQ/L IV
20 mEq/L | INTRAVENOUS | Status: DC
Start: 2017-12-24 — End: 2017-12-25
  Administered 2017-12-24: 18:00:00 via INTRAVENOUS

## 2017-12-24 MED ORDER — INSULIN LISPRO 100 UNIT/ML INJECTION
100 unit/mL | Freq: Once | SUBCUTANEOUS | Status: DC
Start: 2017-12-24 — End: 2017-12-24

## 2017-12-24 MED ORDER — OXYCODONE-ACETAMINOPHEN 5 MG-325 MG TAB
5-325 mg | ORAL | Status: DC | PRN
Start: 2017-12-24 — End: 2017-12-25
  Administered 2017-12-24 – 2017-12-25 (×2): via ORAL

## 2017-12-24 MED ORDER — ROCURONIUM 10 MG/ML IV
10 mg/mL | INTRAVENOUS | Status: DC | PRN
Start: 2017-12-24 — End: 2017-12-24
  Administered 2017-12-24: 14:00:00 via INTRAVENOUS

## 2017-12-24 MED ORDER — LACTATED RINGERS IV
INTRAVENOUS | Status: DC
Start: 2017-12-24 — End: 2017-12-24

## 2017-12-24 MED ORDER — GLUCAGON 1 MG INJECTION
1 mg | INTRAMUSCULAR | Status: DC | PRN
Start: 2017-12-24 — End: 2017-12-24

## 2017-12-24 MED ORDER — PROPOFOL 10 MG/ML IV EMUL
10 mg/mL | INTRAVENOUS | Status: DC | PRN
Start: 2017-12-24 — End: 2017-12-24
  Administered 2017-12-24: 14:00:00 via INTRAVENOUS

## 2017-12-24 MED ORDER — LIDOCAINE (PF) 10 MG/ML (1 %) IJ SOLN
10 mg/mL (1 %) | INTRAMUSCULAR | Status: DC | PRN
Start: 2017-12-24 — End: 2017-12-24

## 2017-12-24 MED ORDER — PROPOFOL 10 MG/ML IV EMUL
10 mg/mL | INTRAVENOUS | Status: AC
Start: 2017-12-24 — End: ?

## 2017-12-24 MED ORDER — FENTANYL CITRATE (PF) 50 MCG/ML IJ SOLN
50 mcg/mL | INTRAMUSCULAR | Status: DC | PRN
Start: 2017-12-24 — End: 2017-12-24
  Administered 2017-12-24 (×3): via INTRAVENOUS

## 2017-12-24 MED ORDER — CLINDAMYCIN PHOSPHATE 150 MG/ML IJ SOLN
150 mg/mL | Freq: Once | INTRAMUSCULAR | Status: AC
Start: 2017-12-24 — End: 2017-12-24
  Administered 2017-12-24: 14:00:00 via INTRAVENOUS

## 2017-12-24 MED ORDER — CALCIUM CARBONATE 200 MG (500 MG) CHEWABLE TAB
200 mg calcium (500 mg) | Freq: Three times a day (TID) | ORAL | Status: DC
Start: 2017-12-24 — End: 2017-12-25
  Administered 2017-12-24 – 2017-12-25 (×4): via ORAL

## 2017-12-24 MED ORDER — NEOSTIGMINE METHYLSULFATE 3 MG/3 ML (1 MG/ML) IV SYRINGE
3 mg/ mL (1 mg/mL) | INTRAVENOUS | Status: DC | PRN
Start: 2017-12-24 — End: 2017-12-24
  Administered 2017-12-24: 16:00:00 via INTRAVENOUS

## 2017-12-24 MED ORDER — HYDROMORPHONE 2 MG/ML INJECTION SOLUTION
2 mg/mL | INTRAMUSCULAR | Status: DC | PRN
Start: 2017-12-24 — End: 2017-12-24

## 2017-12-24 MED ORDER — SODIUM CHLORIDE 0.9 % IJ SYRG
Freq: Three times a day (TID) | INTRAMUSCULAR | Status: DC
Start: 2017-12-24 — End: 2017-12-24

## 2017-12-24 MED ORDER — MORPHINE 4 MG/ML INTRAVENOUS SOLUTION
4 mg/mL | INTRAVENOUS | Status: DC | PRN
Start: 2017-12-24 — End: 2017-12-25

## 2017-12-24 MED ORDER — FENTANYL CITRATE (PF) 50 MCG/ML IJ SOLN
50 mcg/mL | INTRAMUSCULAR | Status: DC | PRN
Start: 2017-12-24 — End: 2017-12-24

## 2017-12-24 MED ORDER — GLUCOSE 4 GRAM CHEWABLE TAB
4 gram | ORAL | Status: DC | PRN
Start: 2017-12-24 — End: 2017-12-24

## 2017-12-24 MED ORDER — ACETAMINOPHEN 325 MG TABLET
325 mg | ORAL | Status: DC | PRN
Start: 2017-12-24 — End: 2017-12-25

## 2017-12-24 MED FILL — PHENYLEPHRINE 10 MG/ML INJECTION: 10 mg/mL | INTRAMUSCULAR | Qty: 100

## 2017-12-24 MED FILL — DIPRIVAN 10 MG/ML INTRAVENOUS EMULSION: 10 mg/mL | INTRAVENOUS | Qty: 70

## 2017-12-24 MED FILL — LACTATED RINGERS IV: INTRAVENOUS | Qty: 100

## 2017-12-24 MED FILL — OXYCODONE-ACETAMINOPHEN 5 MG-325 MG TAB: 5-325 mg | ORAL | Qty: 1

## 2017-12-24 MED FILL — GLYCOPYRROLATE 0.2 MG/ML IJ SOLN: 0.2 mg/mL | INTRAMUSCULAR | Qty: 0.2

## 2017-12-24 MED FILL — FENTANYL CITRATE (PF) 50 MCG/ML IJ SOLN: 50 mcg/mL | INTRAMUSCULAR | Qty: 2

## 2017-12-24 MED FILL — CLINDAMYCIN PHOSPHATE 150 MG/ML IJ SOLN: 150 mg/mL | INTRAMUSCULAR | Qty: 6

## 2017-12-24 MED FILL — ONDANSETRON (PF) 4 MG/2 ML INJECTION: 4 mg/2 mL | INTRAMUSCULAR | Qty: 2

## 2017-12-24 MED FILL — ESMOLOL 10 MG/ML IV SOLN: 100 mg/10 mL (10 mg/mL) | INTRAVENOUS | Qty: 20

## 2017-12-24 MED FILL — LACTATED RINGERS IV: INTRAVENOUS | Qty: 1000

## 2017-12-24 MED FILL — NEOSTIGMINE METHYLSULFATE 3 MG/3 ML (1 MG/ML) IV SYRINGE: 3 mg/ mL (1 mg/mL) | INTRAVENOUS | Qty: 3

## 2017-12-24 MED FILL — ESMOLOL 10 MG/ML IV SOLN: 100 mg/10 mL (10 mg/mL) | INTRAVENOUS | Qty: 10

## 2017-12-24 MED FILL — FAMOTIDINE 20 MG TAB: 20 mg | ORAL | Qty: 1

## 2017-12-24 MED FILL — CALCIUM CARBONATE 200 MG (500 MG) CHEWABLE TAB: 200 mg calcium (500 mg) | ORAL | Qty: 1

## 2017-12-24 MED FILL — LIDOCAINE (PF) 20 MG/ML (2 %) IJ SOLN: 20 mg/mL (2 %) | INTRAMUSCULAR | Qty: 60

## 2017-12-24 MED FILL — ONDANSETRON (PF) 4 MG/2 ML INJECTION: 4 mg/2 mL | INTRAMUSCULAR | Qty: 4

## 2017-12-24 MED FILL — SUCCINYLCHOLINE CHLORIDE 200 MG/10 ML (20 MG/ML) INTRAVENOUS SYRINGE: 200 mg/10 mL (20 mg/mL) | INTRAVENOUS | Qty: 120

## 2017-12-24 MED FILL — D5-1/2 NS & POTASSIUM CHLORIDE 20 MEQ/L IV: 20 mEq/L | INTRAVENOUS | Qty: 1000

## 2017-12-24 MED FILL — GLYCOPYRROLATE 0.2 MG/ML IJ SOLN: 0.2 mg/mL | INTRAMUSCULAR | Qty: 2

## 2017-12-24 MED FILL — ROCURONIUM 10 MG/ML IV: 10 mg/mL | INTRAVENOUS | Qty: 30

## 2017-12-24 MED FILL — BD POSIFLUSH NORMAL SALINE 0.9 % INJECTION SYRINGE: INTRAMUSCULAR | Qty: 40

## 2017-12-24 MED FILL — XYLOCAINE-MPF 20 MG/ML (2 %) INJECTION SOLUTION: 20 mg/mL (2 %) | INTRAMUSCULAR | Qty: 5

## 2017-12-24 MED FILL — DIPRIVAN 10 MG/ML INTRAVENOUS EMULSION: 10 mg/mL | INTRAVENOUS | Qty: 20

## 2017-12-24 NOTE — Anesthesia Pre-Procedure Evaluation (Signed)
Anesthetic History               Review of Systems / Medical History  Patient summary reviewed and nursing notes reviewed    Pulmonary                   Neuro/Psych              Cardiovascular    Hypertension      CHF (Nonischemic cardiomyopathy)  Dysrhythmias   Pacemaker (AICD) and CAD         GI/Hepatic/Renal     GERD: well controlled           Endo/Other    Diabetes: type 2    Arthritis     Other Findings              Physical Exam    Airway  Mallampati: II  TM Distance: 4 - 6 cm  Neck ROM: normal range of motion        Cardiovascular  Regular rate and rhythm,  S1 and S2 normal,  no murmur, click, rub, or gallop             Dental    Dentition: Poor dentition     Pulmonary  Breath sounds clear to auscultation               Abdominal         Other Findings            Anesthetic Plan    ASA: 3  Anesthesia type: total IV anesthesia          Induction: Intravenous  Anesthetic plan and risks discussed with: Patient

## 2017-12-24 NOTE — Interval H&P Note (Signed)
H&P Update:  Kathryn [name] was seen and examined.  History and physical has been reviewed. The patient has been examined. There have been no significant clinical changes since the completion of the originally dated History and Physical.

## 2017-12-24 NOTE — Op Note (Signed)
Kathryn [name]  OPERATIVE REPORT    Name:  Kathryn, [name]  MR#:   [id number]  DOB:  October 06, 1940  ACCOUNT #:  [id number]  DATE OF SERVICE:  12/24/2017    PREOPERATIVE DIAGNOSIS:  Primary hyperparathyroidism.    POSTOPERATIVE DIAGNOSIS:  Primary hyperparathyroidism.    PROCEDURE PERFORMED:  Right lower parathyroidectomy with radio guidance.    SURGEON:  Dellis Filbert L. Crystallee Werden, MD    ASSISTANT:  SA.    ANESTHESIA:  General.    COMPLICATIONS:  None.    SPECIMENS REMOVED:  Right lower parathyroid.    IMPLANTS:  None.    ESTIMATED BLOOD LOSS:  Minimal.    INDICATION:  The patient is a 77 year old woman with a calcium of 13.2 and a parathyroid hormone of 300-400 depending on her draw, who also has a sestamibi showing a right lower parathyroid adenoma and a CT that corroborates that issue.  She is here for radio-guided right lower parathyroidectomy for primary hyperparathyroidism.    PROCEDURE:  After being injected with sestamibi and awaiting 90 minutes, the patient was taken to the operating room, placed in a supine position on the OR table.  After adequate general anesthesia, she was placed into a semi-sitting position with her neck in extension.  Her neck was then prepped and draped to CDC standard.  Prior to incision, a PTH was drawn, and this turned out to be approximately 596.  A small incision was made in the right lower portion of her neck just above her clavicular head by approximately 1 cm, and Bovie electrocautery was used to take down the subcutaneous tissue, platysma, and strap muscles.  The thyroid was then dissected with a tonsil clamp, and using Kittner pushers was rolled medially.  The tracheoesophageal groove was searched, and the right lower parathyroid came into view.  Using a gamma probe, this area was relatively hot in counts per second to approximately 315 counts per second.  The background count was approximately 190.  Dissection of this area showed  what appeared to be a large parathyroid adenoma.  It was dissected with a tonsil clamp. Several clips were placed along its vein, and it was removed.  It was taken to pathology for frozen section, which indeed showed a parathyroid adenoma.  A 10-12 wait then ensued, and then a second PTH was drawn.  This rapid PTH was shown to be 133.  The wound was copiously irrigated with normal saline.  The strap muscles were reapproximated with 3-0 Vicryl, and the platysma was also reapproximated with the same suture.  Skin was closed with 3-0 Vicryl and 4-0 Monocryl.  Steri-Strips and sterile dressings were applied.  0.25% Marcaine with epinephrine was used around the wound.  She tolerated the procedure well, was taken to recovery in stable condition.      Ethyn Schetter Silas [place], MD      [id number]  D:  12/24/2017 14:57  T:  12/24/2017 23:57  JOB #:  [id number]

## 2017-12-24 NOTE — Other (Signed)
Bedside and Verbal shift change report given to Amy, RN (oncoming nurse) by Daeja, RN (offgoing nurse). Report included the following information SBAR, Kardex and MAR.

## 2017-12-24 NOTE — Anesthesia Post-Procedure Evaluation (Signed)
Procedure(s):  RADIO GUIDED RIGHT LOWER PARATHYROIDECTOMY.    total IV anesthesia, general    Anesthesia Post Evaluation      Multimodal analgesia: multimodal analgesia used between 6 hours prior to anesthesia start to PACU discharge  Patient location during evaluation: bedside  Patient participation: complete - patient participated  Level of consciousness: awake  Pain management: adequate  Airway patency: patent  Anesthetic complications: no  Cardiovascular status: stable  Respiratory status: acceptable  Hydration status: acceptable  Post anesthesia nausea and vomiting:  controlled      Vitals Value Taken Time   BP 103/62 12/24/2017 12:59 PM   Temp 36.4 ??C (97.6 ??F) 12/24/2017 12:10 PM   Pulse 83 12/24/2017  1:04 PM   Resp 19 12/24/2017  1:04 PM   SpO2 99 % 12/24/2017  1:04 PM   Vitals shown include unvalidated device data.

## 2017-12-24 NOTE — Other (Addendum)
Assumed patient care. Bedside shift report received from [place].  Patient in bed, awake, alert and oriented x3, resting well with no complaints of pain.  Grand daughter at the bedside. All needs met.     7:58 AM - Bedside shift change report given to [place] (oncoming nurse) by Elie Confer RN (offgoing nurse).  Report given with SBAR, Kardex, Intake/Output, MAR and Recent Results.

## 2017-12-24 NOTE — Brief Op Note (Signed)
BRIEF OPERATIVE NOTE    Date of Procedure: 12/24/2017   Preoperative Diagnosis: Hyperparathyroidism ([place]) [E21.3]  Postoperative Diagnosis: Hyperparathyroidism ([place]) [E21.3    Procedure(s):  RADIO GUIDED RIGHT LOWER PARATHYROIDECTOMY  Surgeon(s) and Role:     Horald Chestnut, MD - Primary         Surgical Assistant: [place]    Surgical Staff:  Circ-1: Ananias Pilgrim  Circ-2: Pecola Lawless, RN  Scrub Tech-1: Chilton Greathouse  Scrub Tech-2: Edd Fabian  Surg Asst-1: Barrie Dunker  Event Time In Time Out   Incision Start 1039    Incision Close 1156      Anesthesia: General   Estimated Blood Loss: min  Specimens:   ID Type Source Tests Collected by Time Destination   1 : Right Lower Parathyroid Frozen Section Parathyroid  Horald Chestnut, MD 12/24/2017 1059 Pathology      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   Preop PTH 590's  Post op 211    Complications: 0  Implants: * No implants in log *

## 2017-12-24 NOTE — Other (Signed)
TRANSFER - OUT REPORT:    Verbal report given to Deja RN on Kathryn [name]  being transferred to [facility] for routine post - op       Report consisted of patient???s Situation, Background, Assessment and   Recommendations(SBAR).     Information from the following report(s) SBAR, Procedure Summary and Intake/Output was reviewed with the receiving nurse.    Lines:   Peripheral IV 12/24/17 Right;Outer Forearm (Active)   Site Assessment Clean, dry, & intact 12/24/2017 12:10 PM   Phlebitis Assessment 0 12/24/2017 12:10 PM   Infiltration Assessment 0 12/24/2017 12:10 PM   Dressing Status Clean, dry, & intact 12/24/2017 12:10 PM   Dressing Type Transparent 12/24/2017 12:10 PM   Hub Color/Line Status Pink;Infusing 12/24/2017 12:10 PM   Action Taken Open ports on tubing capped 12/24/2017 10:21 AM   Alcohol Cap Used Yes 12/24/2017 10:21 AM        Opportunity for questions and clarification was provided.      Patient transported with:   [employer]

## 2017-12-24 NOTE — Other (Signed)
TRANSFER - IN REPORT:    Verbal report received from [place], RN(name) on Kathryn [name]  being received from PACU(unit) for routine post - op      Report consisted of patient???s Situation, Background, Assessment and   Recommendations(SBAR).     Information from the following report(s) SBAR, Kardex, OR Summary and MAR was reviewed with the receiving nurse.    Opportunity for questions and clarification was provided.      Assessment completed upon patient???s arrival to unit and care assumed.

## 2017-12-24 NOTE — Op Note (Signed)
[facility]  OPERATIVE REPORT    Name:  Kathryn [name], Kathryn [name]  MR#:   [id number]  DOB:  Oct 12, 1940  ACCOUNT #:  [id number]  DATE OF SERVICE:  12/24/2017    PREOPERATIVE DIAGNOSIS:  Primary hyperparathyroidism.    POSTOPERATIVE DIAGNOSIS:  Primary hyperparathyroidism.    PROCEDURE PERFORMED:  Right lower parathyroidectomy with radio guidance.    SURGEON:  Dellis Filbert L. Ameira Alessandrini, MD    ASSISTANT:  SA.    ANESTHESIA:  General.    COMPLICATIONS:  None.    SPECIMENS REMOVED:  Right lower parathyroid.    IMPLANTS:  None.    ESTIMATED BLOOD LOSS:  Minimal.    INDICATION:  The patient is a 77 year old woman with a calcium of 13.2 and a parathyroid hormone of 300-400 depending on her draw, who also has a sestamibi showing a right lower parathyroid adenoma and a CT that corroborates that issue.  She is here for radio-guided right lower parathyroidectomy for primary hyperparathyroidism.    PROCEDURE:  After being injected with sestamibi and awaiting 90 minutes, the patient was taken to the operating room, placed in a supine position on the OR table.  After adequate general anesthesia, she was placed into a semi-sitting position with her neck in extension.  Her neck was then prepped and draped to CDC standard.  Prior to incision, a PTH was drawn, and this turned out to be approximately 596.  A small incision was made in the right lower portion of her neck just above her clavicular head by approximately 1 cm, and Bovie electrocautery was used to take down the subcutaneous tissue, platysma, and strap muscles.  The thyroid was then dissected with a tonsil clamp, and using Kittner pushers was rolled medially.  The tracheoesophageal groove was searched, and the right lower parathyroid came into view.  Using a gamma probe, this area was relatively hot in counts per second to approximately 315 counts per second.  The background count was approximately 190.  Dissection of this area showed what appeared to be a large parathyroid  adenoma.  It was dissected with a tonsil clamp. Several clips were placed along its vein, and it was removed.  It was taken to pathology for frozen section, which indeed showed a parathyroid adenoma.  A 10-12 wait then ensued, and then a second PTH was drawn.  This rapid PTH was shown to be 133.  The wound was copiously irrigated with normal saline.  The strap muscles were reapproximated with 3-0 Vicryl, and the platysma was also reapproximated with the same suture.  Skin was closed with 3-0 Vicryl and 4-0 Monocryl.  Steri-Strips and sterile dressings were applied.  0.25% Marcaine with epinephrine was used around the wound.  She tolerated the procedure well, was taken to recovery in stable condition.      Zilphia Kozinski Silas [place], MD      [id number]  D:  12/24/2017 14:57  T:  12/24/2017 23:57  JOB #:  [id number]

## 2017-12-24 NOTE — Discharge Summary (Signed)
[facility]    Name:  Kathryn [name], Kathryn [name]  MR#:   [id number]  DOB:  12/14/1940  ACCOUNT #:  [id number]  ADMIT DATE:  12/24/2017  DISCHARGE DATE:  12/25/2017      DISCHARGE DIAGNOSIS:  Primary hyperparathyroidism.    HOSPITAL COURSE:  The patient is a 77 year old woman with signs and symptoms and labs consistent with primary hyperparathyroidism.  Her calcium was 13.5 at its highest and her PTH was in the low 300s.  She is here with a workup consistent with a right lower parathyroid adenoma.  She was here for excision of that gland.  She underwent an uneventful right lower parathyroidectomy on 10/10 that took out a very large gland and immediately dropped her parathyroid hormone levels from below 600s to 133.  Her calcium during her one day stay dropped by several points down to the low 12s.  Her symptoms abated and she was much improved.  Her wound was healing nicely.  She was sent home on Percocet 5/325 and Tums with calcium 4 Tums p.o. b.i.d. with follow up in 2 weeks.  She also had a normal voice.      Dannell Raczkowski Silas [place], MD      [id number]  D:  12/29/2017 14:14  T:  12/30/2017 9:23  JOB #:  [id number]

## 2017-12-24 NOTE — Op Note (Signed)
BRIEF OPERATIVE NOTE    Date of Procedure: 12/24/2017   Preoperative Diagnosis: Hyperparathyroidism ([place]) [E21.3]  Postoperative Diagnosis: Hyperparathyroidism ([place]) [E21.3    Procedure(s):  RADIO GUIDED RIGHT LOWER PARATHYROIDECTOMY  Surgeon(s) and Role:     Horald Chestnut, MD - Primary         Surgical Assistant: [place]    Surgical Staff:  Circ-1: Ananias Pilgrim  Circ-2: Pecola Lawless, RN  Scrub Tech-1: Chilton Greathouse  Scrub Tech-2: Edd Fabian  Surg Asst-1: Barrie Dunker  Event Time In Time Out   Incision Start 1039    Incision Close 1156      Anesthesia: General   Estimated Blood Loss: min  Specimens:   ID Type Source Tests Collected by Time Destination   1 : Right Lower Parathyroid Frozen Section Parathyroid  Horald Chestnut, MD 12/24/2017 1059 Pathology      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   Preop PTH 590's  Post op 425    Complications: 0  Implants: * No implants in log *

## 2017-12-25 MED ORDER — OXYCODONE-ACETAMINOPHEN 5 MG-325 MG TAB
5-325 mg | ORAL_TABLET | Freq: Four times a day (QID) | ORAL | 0 refills | Status: AC | PRN
Start: 2017-12-25 — End: 2017-12-28

## 2017-12-25 MED FILL — OXYCODONE-ACETAMINOPHEN 5 MG-325 MG TAB: 5-325 mg | ORAL | Qty: 1

## 2017-12-25 MED FILL — CALCIUM CARBONATE 200 MG (500 MG) CHEWABLE TAB: 200 mg calcium (500 mg) | ORAL | Qty: 1

## 2017-12-25 MED FILL — FLUARIX QUAD 2019-2020 (PF) 60 MCG (15 MCG X 4)/0.5 ML IM SYRINGE: 60 mcg (15 mcg x 4)/0.5 mL | INTRAMUSCULAR | Qty: 0.5

## 2017-12-25 NOTE — Progress Notes (Signed)
D/c noted for today, pt will return home with family support through daughter, no d/c concerns noted.       Alfonso Ramus, MSW  Case Management  [phone]

## 2017-12-25 NOTE — Other (Signed)
I have reviewed discharge instructions with the patient.  The patient verbalized understanding.  ??  Discharge medications reviewed with patient and appropriate educational materials and side effects teaching were provided.  ??  Armband was removed and shredded. Pt vitals was stable and pt is cleared for discharge.

## 2017-12-25 NOTE — Discharge Summary (Signed)
[facility]    Name:  Kathryn [name], Kathryn [name]  MR#:   [id number]  DOB:  1941/02/22  ACCOUNT #:  [id number]  ADMIT DATE:  12/24/2017  DISCHARGE DATE:  12/25/2017      DISCHARGE DIAGNOSIS:  Primary hyperparathyroidism.    HOSPITAL COURSE:  The patient is a 77 year old woman with signs and symptoms and labs consistent with primary hyperparathyroidism.  Her calcium was 13.5 at its highest and her PTH was in the low 300s.  She is here with a workup consistent with a right lower parathyroid adenoma.  She was here for excision of that gland.  She underwent an uneventful right lower parathyroidectomy on 10/10 that took out a very large gland and immediately dropped her parathyroid hormone levels from below 600s to 133.  Her calcium during her one day stay dropped by several points down to the low 12s.  Her symptoms abated and she was much improved.  Her wound was healing nicely.  She was sent home on Percocet 5/325 and Tums with calcium 4 Tums p.o. b.i.d. with follow up in 2 weeks.  She also had a normal voice.      Akeelah Seppala Silas [place], MD      [id number]  D:  12/29/2017 14:14  T:  12/30/2017 9:23  JOB #:  [id number]

## 2017-12-25 NOTE — Progress Notes (Signed)
Problem: General Medical Care Plan  Goal: *Vital signs within specified parameters  Outcome: Progressing Towards Goal     Problem: General Medical Care Plan  Goal: *Absence of infection signs and symptoms  Outcome: Progressing Towards Goal     Problem: General Medical Care Plan  Goal: *Skin integrity maintained  Outcome: Progressing Towards Goal     Problem: Pain  Goal: *Control of Pain  Outcome: Progressing Towards Goal     Problem: Falls - Risk of  Goal: *Absence of Falls  Description  Document Patrcia Dolly Fall Risk and appropriate interventions in the flowsheet.  Outcome: Progressing Towards Goal  Note:   Fall Risk Interventions:  Mobility Interventions: Assess mobility with egress test, Communicate number of staff needed for ambulation/transfer, Patient to call before getting OOB, PT Consult for mobility concerns, PT Consult for assist device competence, Utilize walker, cane, or other assistive device         Medication Interventions: Patient to call before getting OOB    Elimination Interventions: Call light in reach, Patient to call for help with toileting needs              Problem: Infection - Risk of, Surgical Site Infection  Goal: *Absence of surgical site infection signs and symptoms  Outcome: Progressing Towards Goal     Problem: Surgical Pathway Day of Surgery  Goal: Off Pathway (Use only if patient is Off Pathway)  Outcome: Progressing Towards Goal

## 2017-12-25 NOTE — Progress Notes (Addendum)
Surgery  No c/p doing well normal voice  AF VSS  Wound good without hematoma or seroma  Morning CA++ pending but last evening was 12.6 down from 13.5  tol po  Home today  Pt being treated for CHF by primary MD

## 2017-12-25 NOTE — Progress Notes (Signed)
Chaplain conducted an initial consultation and Spiritual Assessment for Kathryn [name], who is a 77 y.o.,female. Patient???s Primary Language is: [place].   According to the patient???s EMR Religious Affiliation is: Methodist.     The reason the Patient came to the hospital is:   Patient Active Problem List    Diagnosis Date Noted   ??? S/P parathyroidectomy 12/24/2017   ??? 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   Provided information about Spiritual Care Services.  Chart reviewed.    The following outcomes where achieved:  Patient expressed gratitude for chaplain's visit.    Assessment:  Patient does not have any religious/cultural needs that will affect patient???s preferences in health care.  There are no spiritual or religious issues which require intervention at this time.     Plan:  Chaplains will continue to follow and will provide pastoral care on an as needed/requested basis.  Chaplain recommends bedside caregivers page chaplain on duty if patient shows signs of acute spiritual or emotional distress.    [place]  [phone])

## 2017-12-25 NOTE — Other (Signed)
Surgical by Serita Grit, RN     ??   Review Status Review Entered   In Primary 12/25/2017 12:34   ??   Criteria Review   (surgical complication,hemodynamic instability)  ??  10/11  NOTE  Surgery  No c/p doing well normal voice  AF VSS  Wound good without hematoma or seroma  Morning CA++ pending but last evening was 12.6 down from 13.5  tol po  Home today  ??  ??  VS  - no hemodynamic instability noted  ??  Blood pressure 118/84, pulse 84, temperature 98.8 ??F (37.1 ??C), resp. rate 16, height 5\' 4"  (1.626 m), weight 73.2 kg (161 lb 6.4 oz), SpO2 96 %.

## 2017-12-25 NOTE — Other (Deleted)
Pt admitted with Primary hyperparathyroidism. ??Pt noted to have CHF. If possible, please document in progress notes and d/c summary if you are evaluating and /or treating any of the following:     ? Chronic Systolic CHF  ? Chronic Diastolic CHF  ? Chronic Systolic and Diastolic CHF  ? Other, please specify  ? Clinically unable to determine    The medical record reflects the following:      Risk Factors: PMH CHF      Treatment: Receiving po Lasix, COREG    Thank you,  Hershal Coria, [place], [place]

## 2017-12-25 NOTE — Progress Notes (Signed)
Surgery  No c/p doing well normal voice  AF VSS  Wound good without hematoma or seroma  Morning CA++ pending but last evening was 12.6 down from 13.5  tol po  Home today  Pt being treated for CHF by primary MD

## 2017-12-25 NOTE — Progress Notes (Signed)
 Chaplain conducted an initial consultation and Spiritual Assessment for Kathryn [name], who is a 76 y.o.,female. Patient's Primary Language is: [place].   According to the patient's EMR Religious Affiliation is: Methodist.     The reason the Patient came to the hospital is:   Patient Active Problem List    Diagnosis Date Noted   . S/P parathyroidectomy 12/24/2017   . 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   Provided information about Spiritual Care Services.  Chart reviewed.    The following outcomes where achieved:  Patient expressed gratitude for chaplain's visit.    Assessment:  Patient does not have any religious/cultural needs that will affect patient's preferences in health care.  There are no spiritual or religious issues which require intervention at this time.     Plan:  Chaplains will continue to follow and will provide pastoral care on an as needed/requested basis.  Chaplain recommends bedside caregivers page chaplain on duty if patient shows signs of acute spiritual or emotional distress.    Chaplain Cherene Rake  Spiritual Care  [phone])

## 2017-12-25 NOTE — Progress Notes (Signed)
D/c noted for today, pt will return home with family support through daughter, no d/c concerns noted.       Ozella R[place], MSW  Case Management  [phone]

## 2017-12-25 NOTE — Progress Notes (Signed)
Problem: General Medical Care Plan  Goal: *Vital signs within specified parameters  Outcome: Progressing Towards Goal     Problem: General Medical Care Plan  Goal: *Absence of infection signs and symptoms  Outcome: Progressing Towards Goal     Problem: General Medical Care Plan  Goal: *Skin integrity maintained  Outcome: Progressing Towards Goal     Problem: Pain  Goal: *Control of Pain  Outcome: Progressing Towards Goal     Problem: Falls - Risk of  Goal: *Absence of Falls  Description  Document Kathryn [name] Fall Risk and appropriate interventions in the flowsheet.  Outcome: Progressing Towards Goal  Note:   Fall Risk Interventions:  Mobility Interventions: Assess mobility with egress test, Communicate number of staff needed for ambulation/transfer, Patient to call before getting OOB, PT Consult for mobility concerns, PT Consult for assist device competence, Utilize walker, cane, or other assistive device         Medication Interventions: Patient to call before getting OOB    Elimination Interventions: Call light in reach, Patient to call for help with toileting needs              Problem: Infection - Risk of, Surgical Site Infection  Goal: *Absence of surgical site infection signs and symptoms  Outcome: Progressing Towards Goal     Problem: Surgical Pathway Day of Surgery  Goal: Off Pathway (Use only if patient is Off Pathway)  Outcome: Progressing Towards Goal

## 2018-01-06 ENCOUNTER — Ambulatory Visit: Attending: Surgery | Primary: Internal Medicine

## 2018-01-06 ENCOUNTER — Ambulatory Visit: Admit: 2018-01-06 | Discharge: 2018-01-06 | Payer: MEDICARE | Attending: Surgery | Primary: Internal Medicine

## 2018-01-06 DIAGNOSIS — E210 Primary hyperparathyroidism: Secondary | ICD-10-CM

## 2018-01-06 NOTE — Progress Notes (Signed)
Chief Complaint   Patient presents with   ??? Post OP Follow Up     Right lower parathyroidectomy with radio guidance 12/24/17     Patient doing well, no pain.

## 2018-01-06 NOTE — Progress Notes (Signed)
Progress Note    Patient: Kathryn [name]  MRN: [id number]  SSN: [id number]   Date of Birth: 1941-01-08  Age: 77 y.o.  Sex: female     Chief Complaint   Patient presents with   ??? Post OP Follow Up     Right lower parathyroidectomy with radio guidance 12/24/17       HPI    Ms. Aker returns after her right sided parathyroidectomy.  She looks great, has a normal voice, and denies any tingling in her lips or fingers.  She is finishing up taking her Tums with calcium and is essentially back to baseline.  Her wound looks good and in fact he can barely see it.  I am working on getting her last calcium.  Apparently this was drawn last week by her PCP but we do not have those records so I will continue to check on that and call her if necessary, otherwise we will see her as needed from now on.    Past Medical History:   Diagnosis Date   ??? Arrhythmia    ??? Arthritis    ??? CAD (coronary artery disease)    ??? Cancer ([place])     Left breast   ??? Diabetes ([place])    ??? GERD (gastroesophageal reflux disease)    ??? Heart failure ([place])    ??? Hypertension    ??? 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 BREAST SURGERY PROCEDURE UNLISTED      Left Mastectomy   ??? COLONOSCOPY N/A 10/22/2016    COLONOSCOPY, SURVEILLANCE performed by Jackalyn Lombard, MD at [facility]   ??? HX GYN      Hysterectomy   ??? HX HEENT      Tosillectomy   ??? HX PACEMAKER  2010    [facility]     Allergies   Allergen Reactions   ??? Pcn [Penicillins] Itching   ??? Sulfa (Sulfonamide Antibiotics) Hives     Current Outpatient Medications   Medication Sig Dispense Refill    ??? citalopram (CELEXA) 20 mg tablet Take 20 mg by mouth daily.     ??? allopurinol (ZYLOPRIM) 100 mg tablet Take 100 mg by mouth daily.     ??? losartan (COZAAR) 25 mg tablet Take 25 mg by mouth daily.     ??? furosemide (LASIX) 20 mg tablet Take 20 mg by mouth daily.     ??? gabapentin (NEURONTIN) 300 mg tablet Take 300 mg by mouth daily.     ??? metFORMIN (GLUCOPHAGE) 500 mg tablet Take 500 mg by mouth daily (with breakfast).     ??? carvedilol (COREG) 3.125 mg tablet Take 3.125 mg by mouth two (2) times a day. 2TABS IN AM, 1 IN THE EVENING     ??? spironolactone (ALDACTONE) 25 mg tablet Take 25 mg by mouth daily. 1/2 TAB DAILY     ??? simvastatin (ZOCOR) 20 mg tablet Take 20 mg by mouth nightly.     ??? Cholecalciferol, Vitamin D3, 5,000 unit Tab Take  by mouth daily.     ??? aspirin 81 mg tablet Take 81 mg by mouth daily.  Social History     Socioeconomic History   ??? Marital status: MARRIED     Spouse name: Not on file   ??? Number of children: Not on file   ??? Years of education: Not on file   ??? Highest education level: Not on file   Occupational History   ??? Not on file   Social Needs   ??? Financial resource strain: Not on file   ??? Food insecurity:     Worry: Not on file     Inability: Not on file   ??? Transportation needs:     Medical: Not on file     Non-medical: Not on file   Tobacco Use   ??? Smoking status: Former Smoker     Packs/day: 0.25     Years: 0.50     Pack years: 0.12     Last attempt to quit: 07/01/1967     Years since quitting: 50.5   ??? Smokeless tobacco: Never Used   Substance and Sexual Activity   ??? Alcohol use: No   ??? Drug use: No   ??? Sexual activity: Not on file   Lifestyle   ??? Physical activity:     Days per week: Not on file     Minutes per session: Not on file   ??? Stress: Not on file   Relationships   ??? Social connections:     Talks on phone: Not on file     Gets together: Not on file     Attends religious service: Not on file     Active member of club or organization: Not on file      Attends meetings of clubs or organizations: Not on file     Relationship status: Not on file   ??? Intimate partner violence:     Fear of current or ex partner: Not on file     Emotionally abused: Not on file     Physically abused: Not on file     Forced sexual activity: Not on file   Other Topics Concern   ??? Not on file   Social History Narrative   ??? Not on file     Family History   Problem Relation Age of Onset   ??? Diabetes Mother    ??? Hypertension Mother    ??? Diabetes Father    ??? Hypertension Father    ??? Stroke Father          Review of systems:  Patient denies any reflux, emesis, abdominal pain, change in bowel habits, hematochezia, melena, fever, weight loss, fatigue chills, dermatitis, abnormal moles, change in vision, vertigo, epistaxis, dysphagia, hoarseness, chest pain, palpitations, hypertension, edema, cough, shortness of breath, wheezing, hemoptysis, snoring, hematuria, diabetes, thyroid disease, anemia, bruising, history of blood transfusion, dizziness, headache, or fainting.    Physical Examination    Well developed well nourished female in no apparent distress  Visit Vitals  BP 114/78   Pulse 86   Temp 97.6 ??F (36.4 ??C) (Oral)   Resp 17   Ht 5\' 4"  (1.626 m)   Wt 72.1 kg (159 lb)   SpO2 95%   BMI 27.29 kg/m??      Head: normocephalic, atraumatic  Mouth: Clear, no overt lesions, oral mucosa pink and moist  Neck: supple, no masses, no adenopathy or carotid bruits, trachea midline healing neck wound  Resp: clear to auscultation bilaterally, no wheeze, rhonchi or rales, excursions normal and symmetrical  Cardio: Regular rate and rhythm, no murmurs,  clicks, gallops or rubs, no edema or varicosities  Abdomen: soft, nontender, nondistended, normoactive bowel sounds, no hernias, no hepatosplenomegaly,   Back: Deferred  Extremeties: warm, well-perfused, no tenderness or swelling, normal gait/station  Neuro: sensation and strength grossly intact and symmetrical   Psych: alert and oriented to person, place and time  Breast exam deferred    IMPRESSION  Status post right lower parathyroidectomy for primary hyperparathyroidism.  She had a large gland almost 3 cm in length and one in width.  Her calcium and PTH fell immediately post resection.  Her PTH went from the low 600s to 133.    PLAN  No orders of the defined types were placed in this encounter.    Follow-up PRN  Horald Chestnut, MD

## 2018-01-06 NOTE — Progress Notes (Signed)
Chief Complaint   Patient presents with   . Post OP Follow Up     Right lower parathyroidectomy with radio guidance 12/24/17     Patient doing well, no pain.

## 2018-01-06 NOTE — Progress Notes (Signed)
Progress Note    Patient: Kathryn [name]  MRN: [id number]  SSN: [id number]   Date of Birth: Apr 19, 1940  Age: 77 y.o.  Sex: female     Chief Complaint   Patient presents with   ??? Post OP Follow Up     Right lower parathyroidectomy with radio guidance 12/24/17       HPI    Ms. Tartt returns after her right sided parathyroidectomy.  She looks great, has a normal voice, and denies any tingling in her lips or fingers.  She is finishing up taking her Tums with calcium and is essentially back to baseline.  Her wound looks good and in fact he can barely see it.  I am working on getting her last calcium.  Apparently this was drawn last week by her PCP but we do not have those records so I will continue to check on that and call her if necessary, otherwise we will see her as needed from now on.    Past Medical History:   Diagnosis Date   ??? Arrhythmia    ??? Arthritis    ??? CAD (coronary artery disease)    ??? Cancer ([place])     Left breast   ??? Diabetes ([place])    ??? GERD (gastroesophageal reflux disease)    ??? Heart failure ([place])    ??? Hypertension    ??? 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 BREAST SURGERY PROCEDURE UNLISTED      Left Mastectomy   ??? COLONOSCOPY N/A 10/22/2016    COLONOSCOPY, SURVEILLANCE performed by Jackalyn Lombard, MD at [facility]   ??? HX GYN      Hysterectomy   ??? HX HEENT      Tosillectomy   ??? HX PACEMAKER  2010    [facility]     Allergies   Allergen Reactions   ??? Pcn [Penicillins] Itching   ??? Sulfa (Sulfonamide Antibiotics) Hives     Current Outpatient Medications   Medication Sig Dispense Refill   ??? citalopram (CELEXA) 20 mg tablet  Take 20 mg by mouth daily.     ??? allopurinol (ZYLOPRIM) 100 mg tablet Take 100 mg by mouth daily.     ??? losartan (COZAAR) 25 mg tablet Take 25 mg by mouth daily.     ??? furosemide (LASIX) 20 mg tablet Take 20 mg by mouth daily.     ??? gabapentin (NEURONTIN) 300 mg tablet Take 300 mg by mouth daily.     ??? metFORMIN (GLUCOPHAGE) 500 mg tablet Take 500 mg by mouth daily (with breakfast).     ??? carvedilol (COREG) 3.125 mg tablet Take 3.125 mg by mouth two (2) times a day. 2TABS IN AM, 1 IN THE EVENING     ??? spironolactone (ALDACTONE) 25 mg tablet Take 25 mg by mouth daily. 1/2 TAB DAILY     ??? simvastatin (ZOCOR) 20 mg tablet Take 20 mg by mouth nightly.     ??? Cholecalciferol, Vitamin D3, 5,000 unit Tab Take  by mouth daily.     ??? aspirin 81 mg tablet Take 81 mg by mouth daily.  Social History     Socioeconomic History   ??? Marital status: MARRIED     Spouse name: Not on file   ??? Number of children: Not on file   ??? Years of education: Not on file   ??? Highest education level: Not on file   Occupational History   ??? Not on file   Social Needs   ??? Financial resource strain: Not on file   ??? Food insecurity:     Worry: Not on file     Inability: Not on file   ??? Transportation needs:     Medical: Not on file     Non-medical: Not on file   Tobacco Use   ??? Smoking status: Former Smoker     Packs/day: 0.25     Years: 0.50     Pack years: 0.12     Last attempt to quit: 07/01/1967     Years since quitting: 50.5   ??? Smokeless tobacco: Never Used   Substance and Sexual Activity   ??? Alcohol use: No   ??? Drug use: No   ??? Sexual activity: Not on file   Lifestyle   ??? Physical activity:     Days per week: Not on file     Minutes per session: Not on file   ??? Stress: Not on file   Relationships   ??? Social connections:     Talks on phone: Not on file     Gets together: Not on file     Attends religious service: Not on file     Active member of club or organization: Not on file     Attends meetings of clubs or organizations: Not on file      Relationship status: Not on file   ??? Intimate partner violence:     Fear of current or ex partner: Not on file     Emotionally abused: Not on file     Physically abused: Not on file     Forced sexual activity: Not on file   Other Topics Concern   ??? Not on file   Social History Narrative   ??? Not on file     Family History   Problem Relation Age of Onset   ??? Diabetes Mother    ??? Hypertension Mother    ??? Diabetes Father    ??? Hypertension Father    ??? Stroke Father          Review of systems:  Patient denies any reflux, emesis, abdominal pain, change in bowel habits, hematochezia, melena, fever, weight loss, fatigue chills, dermatitis, abnormal moles, change in vision, vertigo, epistaxis, dysphagia, hoarseness, chest pain, palpitations, hypertension, edema, cough, shortness of breath, wheezing, hemoptysis, snoring, hematuria, diabetes, thyroid disease, anemia, bruising, history of blood transfusion, dizziness, headache, or fainting.    Physical Examination    Well developed well nourished female in no apparent distress  Visit Vitals  BP 114/78   Pulse 86   Temp 97.6 ??F (36.4 ??C) (Oral)   Resp 17   Ht 5\' 4"  (1.626 m)   Wt 72.1 kg (159 lb)   SpO2 95%   BMI 27.29 kg/m??      Head: normocephalic, atraumatic  Mouth: Clear, no overt lesions, oral mucosa pink and moist  Neck: supple, no masses, no adenopathy or carotid bruits, trachea midline healing neck wound  Resp: clear to auscultation bilaterally, no wheeze, rhonchi or rales, excursions normal and symmetrical  Cardio: Regular rate and rhythm, no murmurs,  clicks, gallops or rubs, no edema or varicosities  Abdomen: soft, nontender, nondistended, normoactive bowel sounds, no hernias, no hepatosplenomegaly,   Back: Deferred  Extremeties: warm, well-perfused, no tenderness or swelling, normal gait/station  Neuro: sensation and strength grossly intact and symmetrical  Psych: alert and oriented to person, place and time  Breast exam deferred    IMPRESSION  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  She had a large gland almost 3 cm in length and one in width.  Her calcium and PTH fell immediately post resection.  Her PTH went from the low 600s to 133.    PLAN  No orders of the defined types were placed in this encounter.    Follow-up PRN  Horald Chestnut, MD

## 2018-05-18 ENCOUNTER — Encounter

## 2018-05-26 ENCOUNTER — Inpatient Hospital Stay: Payer: MEDICARE | Attending: Internal Medicine | Primary: Internal Medicine

## 2018-07-21 ENCOUNTER — Inpatient Hospital Stay: Payer: MEDICARE | Attending: Internal Medicine | Primary: Internal Medicine

## 2018-08-15 ENCOUNTER — Inpatient Hospital Stay: Payer: MEDICARE | Attending: Internal Medicine | Primary: Internal Medicine

## 2018-08-19 ENCOUNTER — Encounter

## 2018-08-19 ENCOUNTER — Inpatient Hospital Stay: Admit: 2018-08-19 | Payer: MEDICARE | Attending: Internal Medicine | Primary: Internal Medicine

## 2018-08-19 DIAGNOSIS — R0602 Shortness of breath: Secondary | ICD-10-CM

## 2018-08-19 MED ORDER — IOPAMIDOL 61 % IV SOLN
300 mg iodine /mL (61 %) | Freq: Once | INTRAVENOUS | Status: AC
Start: 2018-08-19 — End: 2018-08-19

## 2018-08-19 MED FILL — ISOVUE-300  61 % INTRAVENOUS SOLUTION: 300 mg iodine /mL (61 %) | INTRAVENOUS | Qty: 80

## 2018-09-01 ENCOUNTER — Encounter

## 2018-10-16 ENCOUNTER — Ambulatory Visit: Payer: MEDICARE | Primary: Internal Medicine

## 2018-11-19 ENCOUNTER — Inpatient Hospital Stay: Admit: 2018-11-19 | Payer: MEDICARE | Attending: Internal Medicine | Primary: Internal Medicine

## 2018-11-19 DIAGNOSIS — Z1231 Encounter for screening mammogram for malignant neoplasm of breast: Secondary | ICD-10-CM

## 2018-11-26 ENCOUNTER — Encounter

## 2018-12-03 ENCOUNTER — Emergency Department: Admit: 2018-12-03 | Payer: MEDICARE | Primary: Internal Medicine

## 2018-12-03 ENCOUNTER — Emergency Department: Admit: 2018-12-04 | Payer: MEDICARE | Primary: Internal Medicine

## 2018-12-03 ENCOUNTER — Inpatient Hospital Stay
Admit: 2018-12-03 | Discharge: 2018-12-04 | Disposition: A | Discharge status: Home / Self Care | Payer: MEDICARE | Attending: Emergency Medicine

## 2018-12-03 DIAGNOSIS — R0602 Shortness of breath: Secondary | ICD-10-CM

## 2018-12-03 LAB — COMPREHENSIVE METABOLIC PANEL
ALT: 20 U/L (ref 12–78)
AST: 28 U/L (ref 15–37)
Albumin: 4.2 gm/dl (ref 3.4–5.0)
Alkaline Phosphatase: 75 U/L (ref 45–117)
Anion Gap: 7 mmol/L (ref 5–15)
BUN: 15 mg/dl (ref 7–25)
CO2: 28 mEq/L (ref 21–32)
Calcium: 9.9 mg/dl (ref 8.5–10.1)
Chloride: 102 mEq/L (ref 98–107)
Creatinine: 1.4 mg/dl — ABNORMAL HIGH (ref 0.6–1.3)
EGFR IF NonAfrican American: 39
GFR African American: 47
Glucose: 103 mg/dl (ref 74–106)
Potassium: 4.1 mEq/L (ref 3.5–5.1)
Sodium: 136 mEq/L (ref 136–145)
Total Bilirubin: 0.7 mg/dl (ref 0.2–1.0)
Total Protein: 8 gm/dl (ref 6.4–8.2)

## 2018-12-03 LAB — POC CHEM8
BUN: 18 mg/dL (ref 7–25)
BUN: 18 mg/dl (ref 7–25)
CALCIUM,IONIZED: 5 mg/dL (ref 4.40–5.40)
CALCIUM,IONIZED: 5 mg/dL (ref 4.40–5.40)
CO2 Total: 29 mmol/L (ref 21–32)
CO2, TOTAL: 29 mmol/L (ref 21–32)
Chloride: 99 mEq/L (ref 98–107)
Chloride: 99 meq/L (ref 98–107)
Creatinine: 1.3 mg/dL (ref 0.6–1.3)
Creatinine: 1.3 mg/dl (ref 0.6–1.3)
Glucose: 105 mg/dL (ref 74–106)
Glucose: 105 mg/dL (ref 74–106)
HCT: 37 % — ABNORMAL LOW (ref 38–45)
HGB: 12.6 g/dL (ref 12.4–17.2)
Hematocrit: 37 % — ABNORMAL LOW (ref 38–45)
Hemoglobin: 12.6 gm/dl (ref 12.4–17.2)
Potassium: 4.2 mEq/L (ref 3.5–4.9)
Potassium: 4.2 meq/L (ref 3.5–4.9)
Sodium: 138 mEq/L (ref 136–145)
Sodium: 138 meq/L (ref 136–145)

## 2018-12-03 LAB — CBC WITH AUTO DIFFERENTIAL
Basophils %: 0.6 % (ref 0–3)
Eosinophils %: 2.5 % (ref 0–5)
Hematocrit: 38.5 % (ref 37.0–50.0)
Hemoglobin: 12.6 gm/dl — ABNORMAL LOW (ref 13.0–17.2)
Immature Granulocytes: 0.3 % (ref 0.0–3.0)
Lymphocytes %: 34.3 % (ref 28–48)
MCH: 29.9 pg (ref 25.4–34.6)
MCHC: 32.7 gm/dl (ref 30.0–36.0)
MCV: 91.4 fL (ref 80.0–98.0)
MPV: 9 fL (ref 6.0–10.0)
Monocytes %: 7.9 % (ref 1–13)
Neutrophils %: 54.4 % (ref 34–64)
Nucleated RBCs: 0 (ref 0–0)
Platelets: 306 10*3/uL (ref 140–450)
RBC: 4.21 M/uL (ref 3.60–5.20)
RDW-SD: 46.5 — ABNORMAL HIGH (ref 36.4–46.3)
WBC: 6.3 10*3/uL (ref 4.0–11.0)

## 2018-12-03 LAB — PROBNP, N-TERMINAL: BNP: 257 pg/ml (ref 0.0–450.0)

## 2018-12-03 LAB — AMB POC CREATININE: Creatinine: 1.4 mg/dl — ABNORMAL HIGH (ref 0.6–1.3)

## 2018-12-03 LAB — TROPONIN: Troponin I: <0.015 ng/ml (ref 0.000–0.045)

## 2018-12-03 LAB — CBC WITH AUTOMATED DIFF
BASOPHILS: 0.6 % (ref 0–3)
EOSINOPHILS: 2.5 % (ref 0–5)
HCT: 38.5 % (ref 37.0–50.0)
HGB: 12.6 gm/dl — ABNORMAL LOW (ref 13.0–17.2)
IMMATURE GRANULOCYTES: 0.3 % (ref 0.0–3.0)
LYMPHOCYTES: 34.3 % (ref 28–48)
MCH: 29.9 pg (ref 25.4–34.6)
MCHC: 32.7 gm/dl (ref 30.0–36.0)
MCV: 91.4 fL (ref 80.0–98.0)
MONOCYTES: 7.9 % (ref 1–13)
MPV: 9 fL (ref 6.0–10.0)
NEUTROPHILS: 54.4 % (ref 34–64)
NRBC: 0 (ref 0–0)
PLATELET: 306 10*3/uL (ref 140–450)
RBC: 4.21 M/uL (ref 3.60–5.20)
RDW-SD: 46.5 — ABNORMAL HIGH (ref 36.4–46.3)
WBC: 6.3 10*3/uL (ref 4.0–11.0)

## 2018-12-03 LAB — METABOLIC PANEL, COMPREHENSIVE
ALT (SGPT): 20 U/L (ref 12–78)
AST (SGOT): 28 U/L (ref 15–37)
Albumin: 4.2 gm/dl (ref 3.4–5.0)
Alk. phosphatase: 75 U/L (ref 45–117)
Anion gap: 7 mmol/L (ref 5–15)
BUN: 15 mg/dl (ref 7–25)
Bilirubin, total: 0.7 mg/dl (ref 0.2–1.0)
CO2: 28 mEq/L (ref 21–32)
Calcium: 9.9 mg/dl (ref 8.5–10.1)
Chloride: 102 mEq/L (ref 98–107)
Creatinine: 1.4 mg/dl — ABNORMAL HIGH (ref 0.6–1.3)
GFR est AA: 47
GFR est non-AA: 39
Glucose: 103 mg/dl (ref 74–106)
Potassium: 4.1 mEq/L (ref 3.5–5.1)
Protein, total: 8 gm/dl (ref 6.4–8.2)
Sodium: 136 mEq/L (ref 136–145)

## 2018-12-03 LAB — TROPONIN I: Troponin-I: <0.015 ng/ml (ref 0.000–0.045)

## 2018-12-03 LAB — NT-PRO BNP: NT pro-BNP: 257 pg/ml (ref 0.0–450.0)

## 2018-12-03 LAB — CREATININE, POC: Creatinine: 1.4 mg/dl — ABNORMAL HIGH (ref 0.6–1.3)

## 2018-12-03 MED ORDER — BUMETANIDE 0.25 MG/ML IJ SOLN
0.25 mg/mL | Freq: Once | INTRAMUSCULAR | Status: AC
Start: 2018-12-03 — End: 2018-12-03
  Administered 2018-12-03: 22:00:00 via INTRAVENOUS

## 2018-12-03 MED FILL — BUMETANIDE 0.25 MG/ML IJ SOLN: 0.25 mg/mL | INTRAMUSCULAR | Qty: 4

## 2018-12-03 NOTE — ED Notes (Signed)
Green top redrawn and sent to the lab

## 2018-12-03 NOTE — ED Notes (Signed)
Bedside and Verbal shift change report given to Everlena Cooper, Therapist, sports (oncoming nurse) by Estill Batten, RN (offgoing nurse). Report included the following information SBAR, ED Summary, MAR and Recent Results.

## 2018-12-03 NOTE — ED Provider Notes (Signed)
[facility]  Emergency Department Treatment Report    Patient: Kathryn [name] Age: 78 y.o. Sex: female    Date of Birth: 06-07-1940 Admit Date: 12/03/2018 PCP: Royanne Foots, MD   MRN: [id number]  CSN: [id number]  Attending: Maylon Cos, MD   Room: OTF/OTF Time Dictated: 5:36 PM APP: Hilton Sinclair, MPA, PA-C     Chief Complaint  Chief Complaint   Patient presents with   ??? Shortness of Breath         History of Present Illness   78 y.o. female who presents to the ED with shortness of breath.  This started 3 days ago and timing is described as gradual onset.  Patient states that she did not even really notice it, her family had called EMS, she did not even really want to come here.  He does tell me that it is worse with exertion.  She also endorses some orthopnea, abdominal distention, and PND.  She tells me that she occasionally has a cough that is productive of frothy white sputum.  She denies any lower extremity edema.  She denies any chest pain.  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  She states that she has not seen her cardiologist in a while.  She is followed by Dr. Estrella Myrtle.  When I asked her if she knew that he switched practices she stated that she does not know that.    Review of Systems   Review of Systems   Constitutional: Negative for chills, diaphoresis and fever.   HENT: Negative for congestion and sore throat.    Eyes: Negative for blurred vision and double vision.   Respiratory: Positive for cough, sputum production and shortness of breath.    Cardiovascular: Positive for orthopnea and PND. Negative for chest pain.   Gastrointestinal: Negative for abdominal pain, constipation, diarrhea, nausea and vomiting.        Abdominal swelling   Genitourinary: Negative for dysuria.   Musculoskeletal: Negative for joint pain and myalgias.   Skin: Negative for rash.    Neurological: Negative for dizziness and loss of consciousness.   Endo/Heme/Allergies: Does not bruise/bleed easily.   Psychiatric/Behavioral: Negative for substance abuse.   All other systems reviewed and are negative.      Past Medical History     Past Medical History:   Diagnosis Date   ??? Arrhythmia    ??? Arthritis    ??? CAD (coronary artery disease)    ??? Cancer ([place])     Left breast   ??? Diabetes ([place])    ??? GERD (gastroesophageal reflux disease)    ??? Heart failure ([place])    ??? Hypertension    ??? 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 BREAST SURGERY PROCEDURE UNLISTED      Left Mastectomy   ??? COLONOSCOPY N/A 10/22/2016    COLONOSCOPY, SURVEILLANCE performed by Jackalyn Lombard, MD at [facility]   ??? HX GYN      Hysterectomy   ???  HX HEENT      Tosillectomy   ??? HX PACEMAKER  2010    [facility]       Family History     Family History   Problem Relation Age of Onset   ??? Diabetes Mother    ??? Hypertension Mother    ??? Diabetes Father    ??? Hypertension Father    ??? Stroke Father        Social History   MASSIEL STIPP  reports that she quit smoking about 51 years ago. She has a 0.13 pack-year smoking history. She has never used smokeless tobacco. She reports that she does not drink alcohol or use drugs.     Current Medications     Prior to Admission Medications   Prescriptions Last Dose Informant Patient Reported? Taking?   ALPRAZolam (XANAX) 0.5 mg tablet   Yes Yes   Sig: Take 0.5 mg by mouth nightly as needed for Anxiety.   Cholecalciferol, Vitamin D3, 5,000 unit Tab   Yes Yes   Sig: Take  by mouth daily.    allopurinol (ZYLOPRIM) 100 mg tablet   Yes Yes   Sig: Take 100 mg by mouth daily.   aspirin 81 mg tablet   Yes Yes   Sig: Take 81 mg by mouth daily.   carvedilol (COREG) 3.125 mg tablet   Yes Yes   Sig: Take 3.125 mg by mouth two (2) times a day. 2TABS IN AM, 1 IN THE EVENING   citalopram (CELEXA) 20 mg tablet   Yes No   Sig: Take 20 mg by mouth daily.   furosemide (LASIX) 20 mg tablet   Yes Yes   Sig: Take 20 mg by mouth daily.   gabapentin (NEURONTIN) 300 mg tablet   Yes No   Sig: Take 300 mg by mouth daily.   losartan (COZAAR) 25 mg tablet   Yes Yes   Sig: Take 25 mg by mouth daily.   metFORMIN (GLUCOPHAGE) 500 mg tablet   Yes Yes   Sig: Take 500 mg by mouth daily (with breakfast).   simvastatin (ZOCOR) 20 mg tablet   Yes Yes   Sig: Take 20 mg by mouth nightly.   spironolactone (ALDACTONE) 25 mg tablet   Yes Yes   Sig: Take 25 mg by mouth daily. 1/2 TAB DAILY      Facility-Administered Medications: None       Allergies     Allergies   Allergen Reactions   ??? Pcn [Penicillins] Itching   ??? Sulfa (Sulfonamide Antibiotics) Hives       Physical Exam     ED Triage Vitals [12/03/18 1731]   ED Encounter Vitals Group      BP (!) 153/90      Pulse (Heart Rate) 99      Resp Rate 19      Temp 98.7 ??F (37.1 ??C)      Temp src       O2 Sat (%) 98 %      Weight 140 lb      Height 5' 4"        Physical Exam  Vitals signs and nursing note reviewed.   Constitutional:       Comments: 78 yrs old female who is well developed, well-nourished and appears stated age.   HENT:      Head: Normocephalic and atraumatic.      Right Ear: External ear normal.      Left Ear: External ear normal.  Nose: Nose normal.   Eyes:      Conjunctiva/sclera: Conjunctivae normal.   Neck:      Musculoskeletal: Normal range of motion.   Cardiovascular:      Rate and Rhythm: Normal rate and regular rhythm.   Pulmonary:      Effort: Pulmonary effort is normal. No respiratory distress.      Breath sounds: Rhonchi present.   Abdominal:       General: Abdomen is protuberant.      Tenderness: There is no abdominal tenderness.   Musculoskeletal: Normal range of motion.         General: No deformity.   Skin:     General: Skin is warm and dry.   Neurological:      Mental Status: She is alert and oriented to person, place, and time.   Psychiatric:         Mood and Affect: Mood and affect normal.         Impression and Management Plan   78 y.o. female presents with Symptoms concerning for CHF:    -  place the patient on a cardiac monitor    -  place a line, draw a CBC, CMP, BNP, and a troponin    -  get an EKG and a chest x-ray    -  use oxygen as needed to support SPO2's    -  initiate diuretics based on albumin and renal function    Further testing and management based on pts response and evolution of sxms.        Diagnostic Studies   Lab:   Recent Results (from the past 24 hour(s))   CBC WITH AUTOMATED DIFF    Collection Time: 12/03/18  5:30 PM   Result Value Ref Range    WBC 6.3 4.0 - 11.0 1000/mm3    RBC 4.21 3.60 - 5.20 M/uL    HGB 12.6 (L) 13.0 - 17.2 gm/dl    HCT 38.5 37.0 - 50.0 %    MCV 91.4 80.0 - 98.0 fL    MCH 29.9 25.4 - 34.6 pg    MCHC 32.7 30.0 - 36.0 gm/dl    PLATELET 306 140 - 450 1000/mm3    MPV 9.0 6.0 - 10.0 fL    RDW-SD 46.5 (H) 36.4 - 46.3      NRBC 0 0 - 0      IMMATURE GRANULOCYTES 0.3 0.0 - 3.0 %    NEUTROPHILS 54.4 34 - 64 %    LYMPHOCYTES 34.3 28 - 48 %    MONOCYTES 7.9 1 - 13 %    EOSINOPHILS 2.5 0 - 5 %    BASOPHILS 0.6 0 - 3 %   NT-PRO BNP    Collection Time: 12/03/18  5:30 PM   Result Value Ref Range    NT pro-BNP 257.0 0.0 - 450.0 pg/ml   TROPONIN I    Collection Time: 12/03/18  5:30 PM   Result Value Ref Range    Troponin-I <0.015 0.000 - 4.332 ng/ml   METABOLIC PANEL, COMPREHENSIVE    Collection Time: 12/03/18  6:40 PM   Result Value Ref Range    Sodium 136 136 - 145 mEq/L    Potassium 4.1 3.5 - 5.1 mEq/L    Chloride 102 98 - 107 mEq/L    CO2 28 21 - 32 mEq/L    Glucose 103 74 - 106 mg/dl    BUN 15 7 - 25 mg/dl     Creatinine  1.4 (H) 0.6 - 1.3 mg/dl    GFR est AA 47.0      GFR est non-AA 39      Calcium 9.9 8.5 - 10.1 mg/dl    AST (SGOT) 28 15 - 37 U/L    ALT (SGPT) 20 12 - 78 U/L    Alk. phosphatase 75 45 - 117 U/L    Bilirubin, total 0.7 0.2 - 1.0 mg/dl    Protein, total 8.0 6.4 - 8.2 gm/dl    Albumin 4.2 3.4 - 5.0 gm/dl    Anion gap 7 5 - 15 mmol/L   CREATININE, POC    Collection Time: 12/03/18  6:52 PM   Result Value Ref Range    Creatinine 1.4 (H) 0.6 - 1.3 mg/dl   POC CHEM8    Collection Time: 12/03/18  7:03 PM   Result Value Ref Range    Sodium 138 136 - 145 mEq/L    Potassium 4.2 3.5 - 4.9 mEq/L    Chloride 99 98 - 107 mEq/L    CO2, TOTAL 29 21 - 32 mmol/L    Glucose 105 74 - 106 mg/dL    BUN 18 7 - 25 mg/dl    Creatinine 1.3 0.6 - 1.3 mg/dl    HCT 37 (L) 38 - 45 %    HGB 12.6 12.4 - 17.2 gm/dl    CALCIUM,IONIZED 5.00 4.40 - 5.40 mg/dL       Imaging:  (All imaging personally reviewed by myself in conjunction with [facility], Jeanine Luz, MD)  Xr Chest Sngl V    Result Date: 12/03/2018  Chest Indication: Difficulty Breathing Comparison: 07/27/2015 Findings: AP portable upright view of the chest demonstrates that the cardiac silhouette is not  enlarged.  There is no focal infiltrate, pleural effusion, vascular congestion, or pneumothorax. There is a pacemaker present.     Impression: No acute cardiopulmonary disease.     Ct Chest W Con Pe Protocol    Result Date: 12/03/2018  CTA OF THE CHEST WITH INTRAVENOUS CONTRAST INDICATION: Shortness of breath.    COMPARISON: Chest x-ray performed the same day. TECHNIQUE: CTA of the chest was performed following nonionic intravenous contrast administration. Multiplanar 3-dimensional maximum intensity projection reconstructions were performed and reviewed. DICOM format image data is available to non-affiliated external healthcare facilities or entities on a secure, media free, reciprocally searchable basis with patient  authorization for 12 months following the date of the study. FINDINGS: The tracheobronchial tree is patent centrally. There is no focal infiltrate, pleural effusion, or pneumothorax. The heart is not enlarged. There is no pericardial effusion. There is no evidence of aortic dissection or pulmonary embolus. There is no significant mediastinal or hilar adenopathy. The partially imaged upper abdomen demonstrates a small hiatal hernia. The osseous structures are acutely intact.     IMPRESSION: 1.  Negative for pulmonary embolism. 2.  Small hiatal hernia.          EKG: (EKG personally reviewed by myself in conjunction with [facility], SARA N, MD who performed final read) normal sinus rhythm with low voltage QRS, incomplete left bundle branch block, ventricular response rate of 82 bpm, PR interval is 128 ms, QRS duration is 112 ms, QTC 469 ms.    ED Course     Patient Vitals for the past 24 hrs:   Temp Pulse Resp BP SpO2   12/03/18 2002 ??? 77 15 137/68 99 %   12/03/18 1946 ??? (!) 110 20 134/74 97 %   12/03/18 1931 ???  74 19 (!) 105/90 98 %   12/03/18 1816 ??? 81 24 139/77 98 %   12/03/18 1731 98.7 ??F (37.1 ??C) 99 19 (!) 153/90 98 %            Medications   bumetanide (BUMEX) injection 1 mg (1 mg IntraVENous Given 12/03/18 1758)   iopamidoL (ISOVUE 300) 61 % contrast injection 80 mL (80 mL IntraVENous Given 12/03/18 2026)       Procedures    Medical Decision Making   Patient presented here with complaints of shortness of breath, however I cannot find really any source for it.  Attentively she was in CHF but the patient does not appear to be clinically volume overloaded.  Her lungs are clear on both x-ray as well as CT.  She does not have a PE.  Does not sound like she is wheezing.  She does not appear to be anemic.  Troponin was normal and since this is been going on for so long, I would have expected it to be elevated if it was ischemic.  We ambulated the patient and her oxygen saturation stayed above 98%.  I have advised her to  follow-up with her cardiologist as an outpatient and have given her his updated information.     This patient was evaluated in the emergency department for symptoms described in the history of present illness.  Patient was evaluated in the content of global COVID-19 pandemic, which necessitated consideration of the patient may be at risk for infection with the SARS-COV-2 virus that causes COVID-19.  Institutional protocols and algorithms that pertain to the evaluation of patients at risk for COVID-19 are in a state of rapid change based on the information released by regulatory bodies including the CDC and federal state organizations.  These policies and algorithms were followed during the patient's care in the emergency department.  Patient was swabbed accordingly and will be sent to outpatient lab.Patient will be discharged home with strict return precautions and strict orders to quarantine.I have given the patient extensive literature on home quarantine, self-isolation, social distancing, and how to care for self and others with COVID-19.  Patient remained stable here in the emergency room and is appropriate for outpatient follow-up and discharged home    I have counseled the patient on the signs and symptoms of COVID-19, and the importance of keeping oneself separated from others by isolation and self quarantine, particularly while awaiting test results.  I stressed the importance of informing close contacts that the patient is being tested, that if it comes back positive, the patient will need to let all close contacts know. as well as the health department so that they can do contact tracing.  We reviewed the services that may be available to assist the patient in successful isolation and quarantining.    Patient was discharged home for self quarantine      Final Diagnosis       ICD-10-CM ICD-9-CM   1. SOB (shortness of breath)  R06.02 786.05   2. Orthopnea  R06.01 786.02    3. Chronic systolic heart failure (HCC)  I50.22 428.22   4. Nonischemic cardiomyopathy ([place])  I42.8 425.4       Disposition   Discharged home    Discharge Medication List as of 12/03/2018  9:32 PM          Type(s) of Personal Protection Equipment (PPE) worn during exam:  100 mask, eyeshield, gloves    Hilton Sinclair, MPA, PA-C  12/03/2018  5:36 PM    The patient was personally evaluated by myself and [facility], SARA N, MD who agrees with the above assessment and plan.    My signature above authenticates this document and my orders, the final diagnosis(es), discharge prescription (s), and instructions in the Epic record. If you have any questions please contact [phone].     Nursing notes have been reviewed by the Physician/Advanced Practice Clinician. Dragon medical dictation software was used for portions of this report. Unintended voice recognition errors may occur.

## 2018-12-03 NOTE — ED Notes (Signed)
10:18 PM  12/03/18     Discharge instructions given to Kathryn [name] (name) with verbalization of understanding. Patient accompanied by spouse.  Patient discharged with the following prescriptions n/a. Patient discharged to home (destination).      Hayes Ludwig, RN

## 2018-12-03 NOTE — ED Triage Notes (Signed)
Weakness generalized and shortness of breath for 3 days, worse with exertion. Room air sats 96-98.

## 2018-12-03 NOTE — ED Notes (Signed)
Report given to Kalisha RN

## 2018-12-03 NOTE — ED Notes (Signed)
Covid swab collected; labeled & sent to lab for processing.

## 2018-12-03 NOTE — ED Notes (Signed)
Pt assisted to bedside commode without difficulty.    Pt now to CT via stretcher.

## 2018-12-03 NOTE — ED Notes (Signed)
Pt ambulated on pulse ox as ordered. Denied feeling dizzy/light-headed & did not feel SOB while walking. Willia Craze, PA notified.

## 2018-12-03 NOTE — ED Notes (Signed)
Portable chest xray at the bedside

## 2018-12-03 NOTE — ED Notes (Signed)
Assisted pt to the bedside commode, voided.

## 2018-12-03 NOTE — ED Provider Notes (Signed)
ED Provider Notes by Kathryn Sinclair, PA-C at 12/03/18 1736                Author: Hilton Sinclair, PA-C  Service: Emergency Medicine  Author Type: Physician Assistant       Filed: 12/04/18 0313  Date of Service: 12/03/18 1736  Status: Attested           Editor: Artemio Aly (Physician Assistant)  Cosigner: Maylon Cos, MD at 12/04/18 2137          Attestation signed by Maylon Cos, MD at 12/04/18 2137       Continued by Dr. Hermina Barters dictation software was used for portions of this report. Unintended errors in transcription may occur.       I interviewed and examined the patient. I discussed with the mid-level provider agree with their evaluation and plan as documented here.           ekg shows nsr rate 82 pr 128 qrs 112 qtc 469 no STE read by myself       CXR image reviewed by myself  No infiltrate       Patient lungs are clear heart regular nontender abdomen   There is no calf pain nor leg edema   Labs reviewed, CTA obtained which does not show a PE or pulmonary edema. She is not having chest pain. She is not dyspneic with conversation. She is not hypoxic.   At this time it does not appear that the patient requires admission.    She was ambulated without hypoxia.   We believe she can follow up as outpatient, return If worse.   An outpatient covid test sent.                                                               [name]   Emergency Department Treatment Report          Patient: Kathryn [name]  Age: 78 y.o.  Sex: female          Date of Birth: 10-09-1940  Admit Date: 12/03/2018  PCP: Royanne Foots, MD     MRN: [id number]   CSN: [id number]   Attending: Maylon Cos, MD         Room: OTF/OTF  Time Dictated: 5:36 PM  APP: Kathryn [name], MPA, PA-C        Chief Complaint     Chief Complaint       Patient presents with        ?  Shortness of Breath               History of Present Illness     78 y.o. female  who presents to the ED with  shortness of breath.  This started 3 days ago and timing is described as gradual onset.  Patient states that she did not even really notice it, her family had called EMS, she did not even really want to come here.  He does  tell me that it is worse with exertion.  She also endorses some orthopnea, abdominal distention, and PND.  She tells me that she occasionally has a cough that  is productive of frothy white sputum.  She denies any lower extremity edema.  She denies any  chest pain.  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  She states that she has not seen her cardiologist in a while.  She is followed by Dr. Estrella Myrtle.  When I asked  her if she knew that he switched practices she stated that she does not know that.        Review of Systems     Review of Systems    Constitutional: Negative for chills, diaphoresis and fever.    HENT: Negative for congestion and sore throat.     Eyes: Negative for blurred vision and double vision.    Respiratory: Positive for cough, sputum production  and shortness of breath.     Cardiovascular: Positive for orthopnea and PND . Negative for chest pain.    Gastrointestinal: Negative for abdominal pain, constipation, diarrhea, nausea and vomiting.         Abdominal swelling    Genitourinary: Negative for dysuria.    Musculoskeletal: Negative for joint pain and myalgias.    Skin: Negative for rash.    Neurological: Negative for dizziness and loss of consciousness.    Endo/Heme/Allergies: Does not bruise/bleed easily.    Psychiatric/Behavioral: Negative for substance abuse.    All other systems reviewed and are negative.           Past Medical History          Past Medical History:        Diagnosis  Date         ?  Arrhythmia       ?  Arthritis       ?  CAD (coronary artery disease)       ?  Cancer [facility])            Left breast         ?  Diabetes ([place])       ?  GERD (gastroesophageal reflux disease)        ?  Heart failure ([place])       ?  Hypertension       ?  ICD (implantable cardioverter-defibrillator) battery depletion  06/28/2014          Implants: EXPLANTED DEVICE ST [employer]UDE [id number] DOI 06/12/08 NEW DEVICE ST JUDE UNIFY ASSURA [id number] RA LEAD ST [place] AJ:[id number]  RV LEAD ST JUDE 2094B SJ:[id number] LV LEAD 1156T UT:[id number] PROGRAMMED DDD 60-130 VT 1 150 MONITOR VT 2 171 ATPX2,30J,40J VF 200 ATP,30J,40J             Surgical History          Past Surgical History:         Procedure  Laterality  Date          ?  BREAST SURGERY PROCEDURE UNLISTED              Left Mastectomy          ?  COLONOSCOPY  N/A  10/22/2016          COLONOSCOPY, SURVEILLANCE performed by Jackalyn Lombard, MD at [facility] ENDOSCOPY          ?  HX GYN              Hysterectomy          ?  HX HEENT  Tosillectomy          ?  HX PACEMAKER    2010          [facility]             Family History          Family History         Problem  Relation  Age of Onset          ?  Diabetes  Mother       ?  Hypertension  Mother       ?  Diabetes  Father       ?  Hypertension  Father            ?  Stroke  Father               Social History     [name]  reports that she quit smoking about 51 years ago.  She has a 0.13 pack-year smoking history. She has never used smokeless tobacco. She reports that she does not drink alcohol or use drugs.         Current Medications          Prior to Admission Medications     Prescriptions  Last Dose  Informant  Patient Reported?  Taking?      ALPRAZolam (XANAX) 0.5 mg tablet      Yes  Yes      Sig: Take 0.5 mg by mouth nightly as needed for Anxiety.      Cholecalciferol, Vitamin D3, 5,000 unit Tab      Yes  Yes      Sig: Take  by mouth daily.      allopurinol (ZYLOPRIM) 100 mg tablet      Yes  Yes      Sig: Take 100 mg by mouth daily.      aspirin 81 mg tablet      Yes  Yes      Sig: Take 81 mg by mouth daily.      carvedilol (COREG) 3.125 mg tablet      Yes  Yes      Sig: Take 3.125 mg by  mouth two (2) times a day. 2TABS IN AM, 1 IN THE EVENING      citalopram (CELEXA) 20 mg tablet      Yes  No      Sig: Take 20 mg by mouth daily.      furosemide (LASIX) 20 mg tablet      Yes  Yes      Sig: Take 20 mg by mouth daily.      gabapentin (NEURONTIN) 300 mg tablet      Yes  No      Sig: Take 300 mg by mouth daily.      losartan (COZAAR) 25 mg tablet      Yes  Yes      Sig: Take 25 mg by mouth daily.      metFORMIN (GLUCOPHAGE) 500 mg tablet      Yes  Yes      Sig: Take 500 mg by mouth daily (with breakfast).      simvastatin (ZOCOR) 20 mg tablet      Yes  Yes      Sig: Take 20 mg by mouth nightly.      spironolactone (ALDACTONE) 25 mg tablet      Yes  Yes      Sig: Take  25 mg by mouth daily. 1/2 TAB DAILY               Facility-Administered Medications: None             Allergies          Allergies        Allergen  Reactions         ?  Pcn [Penicillins]  Itching         ?  Sulfa (Sulfonamide Antibiotics)  Hives             Physical Exam          ED Triage Vitals [12/03/18 1731]     ED Encounter Vitals Group           BP  (!) 153/90        Pulse (Heart Rate)  99        Resp Rate  19        Temp  98.7 ??F (37.1 ??C)        Temp src          O2 Sat (%)  98 %        Weight  140 lb           Height  5' 4"            Physical Exam   Vitals signs and nursing note reviewed.   Constitutional:        Comments: 78 yrs old female who is well developed, well-nourished and appears stated age.   HENT:       Head: Normocephalic and atraumatic.      Right Ear: External ear normal.      Left Ear: External ear normal.      Nose: Nose normal.   Eyes:       Conjunctiva/sclera: Conjunctivae normal.   Neck :       Musculoskeletal: Normal range of motion.   Cardiovascular :       Rate and Rhythm: Normal rate and regular rhythm.   Pulmonary :       Effort: Pulmonary effort is normal. No respiratory distress.      Breath sounds: Rhonchi present.    Abdominal:      General: Abdomen is protuberant.      Tenderness: There is no abdominal  tenderness.     Musculoskeletal: Normal range of motion.          General: No deformity.    Skin:      General: Skin is warm and dry.   Neurological :       Mental Status: She is alert and oriented to person, place, and time.    Psychiatric:         Mood and Affect: Mood and affect normal.               Impression and Management Plan     78 y.o. female  presents with Symptoms concerning for CHF:     -  place the patient on a cardiac monitor     -  place a line, draw a CBC, CMP, BNP, and a troponin     -  get an EKG and a chest x-ray     -  use oxygen as needed to support SPO2's     -  initiate diuretics based on albumin and renal function      Further testing and management based on pts response and evolution of sxms.  Diagnostic Studies     Lab:      Recent Results (from the past 24 hour(s))     CBC WITH AUTOMATED DIFF          Collection Time: 12/03/18  5:30 PM         Result  Value  Ref Range            WBC  6.3  4.0 - 11.0 1000/mm3       RBC  4.21  3.60 - 5.20 M/uL       HGB  12.6 (L)  13.0 - 17.2 gm/dl       HCT  38.5  37.0 - 50.0 %       MCV  91.4  80.0 - 98.0 fL       MCH  29.9  25.4 - 34.6 pg       MCHC  32.7  30.0 - 36.0 gm/dl       PLATELET  306  140 - 450 1000/mm3       MPV  9.0  6.0 - 10.0 fL       RDW-SD  46.5 (H)  36.4 - 46.3         NRBC  0  0 - 0         IMMATURE GRANULOCYTES  0.3  0.0 - 3.0 %       NEUTROPHILS  54.4  34 - 64 %       LYMPHOCYTES  34.3  28 - 48 %       MONOCYTES  7.9  1 - 13 %       EOSINOPHILS  2.5  0 - 5 %       BASOPHILS  0.6  0 - 3 %       NT-PRO BNP          Collection Time: 12/03/18  5:30 PM         Result  Value  Ref Range            NT pro-BNP  257.0  0.0 - 450.0 pg/ml       TROPONIN I          Collection Time: 12/03/18  5:30 PM         Result  Value  Ref Range            Troponin-I  <0.015  0.000 - 0.045 ng/ml       METABOLIC PANEL, COMPREHENSIVE          Collection Time: 12/03/18  6:40 PM         Result  Value  Ref Range            Sodium  136  136 - 145 mEq/L        Potassium  4.1  3.5 - 5.1 mEq/L       Chloride  102  98 - 107 mEq/L       CO2  28  21 - 32 mEq/L       Glucose  103  74 - 106 mg/dl       BUN  15  7 - 25 mg/dl       Creatinine  1.4 (H)  0.6 - 1.3 mg/dl       GFR est AA  47.0          GFR est non-AA  39          Calcium  9.9  8.5 -  10.1 mg/dl       AST (SGOT)  28  15 - 37 U/L       ALT (SGPT)  20  12 - 78 U/L       Alk. phosphatase  75  45 - 117 U/L       Bilirubin, total  0.7  0.2 - 1.0 mg/dl       Protein, total  8.0  6.4 - 8.2 gm/dl       Albumin  4.2  3.4 - 5.0 gm/dl       Anion gap  7  5 - 15 mmol/L       CREATININE, POC          Collection Time: 12/03/18  6:52 PM         Result  Value  Ref Range            Creatinine  1.4 (H)  0.6 - 1.3 mg/dl       POC CHEM8          Collection Time: 12/03/18  7:03 PM         Result  Value  Ref Range            Sodium  138  136 - 145 mEq/L       Potassium  4.2  3.5 - 4.9 mEq/L       Chloride  99  98 - 107 mEq/L       CO2, TOTAL  29  21 - 32 mmol/L       Glucose  105  74 - 106 mg/dL       BUN  18  7 - 25 mg/dl       Creatinine  1.3  0.6 - 1.3 mg/dl       HCT  37 (L)  38 - 45 %       HGB  12.6  12.4 - 17.2 gm/dl            CALCIUM,IONIZED  5.00  4.40 - 5.40 mg/dL           Imaging:  (All imaging personally reviewed by myself in conjunction with [facility], Jeanine Luz, MD)   Xr Chest Sngl V      Result Date: 12/03/2018   Chest Indication: Difficulty Breathing Comparison: 07/27/2015 Findings: AP portable upright view of the chest demonstrates that the cardiac silhouette is not  enlarged.  There is no focal infiltrate, pleural effusion, vascular congestion, or pneumothorax.  There is a pacemaker present.       Impression: No acute cardiopulmonary disease.       Ct Chest W Con Pe Protocol      Result Date: 12/03/2018   CTA OF THE CHEST WITH INTRAVENOUS CONTRAST INDICATION: Shortness of breath.    COMPARISON: Chest x-ray performed the same day. TECHNIQUE: CTA of the chest was performed following nonionic intravenous contrast  administration. Multiplanar 3-dimensional  maximum intensity projection reconstructions were performed and reviewed. DICOM format image data is available to non-affiliated external healthcare facilities or entities on a secure, media free, reciprocally searchable basis with patient authorization  for 12 months following the date of the study. FINDINGS: The tracheobronchial tree is patent centrally. There is no focal infiltrate, pleural effusion, or pneumothorax. The heart is not enlarged. There is no pericardial effusion. There is no evidence  of aortic dissection or pulmonary embolus. There is no significant mediastinal or hilar adenopathy. The partially imaged upper abdomen demonstrates a small hiatal  hernia. The osseous structures are acutely intact.       IMPRESSION: 1.  Negative for pulmonary embolism. 2.  Small hiatal hernia.              EKG: (EKG personally reviewed by myself in conjunction with [facility], SARA N, MD who performed final read) normal sinus rhythm with low voltage  QRS, incomplete left bundle branch block, ventricular response rate of 82 bpm, PR interval is 128 ms, QRS duration is 112 ms, QTC 469 ms.        ED Course        Patient Vitals for the past 24 hrs:            Temp  Pulse  Resp  BP  SpO2            12/03/18 2002  --  77  15  137/68  99 %            12/03/18 1946  --  (!) 110  20  134/74  97 %     12/03/18 1931  --  74  19  (!) 105/90  98 %     12/03/18 1816  --  81  24  139/77  98 %            12/03/18 1731  98.7 ??F (37.1 ??C)  99  19  (!) 153/90  98 %                    Medications       bumetanide (BUMEX) injection 1 mg (1 mg IntraVENous Given 12/03/18 1758)       iopamidoL (ISOVUE 300) 61 % contrast injection 80 mL (80 mL IntraVENous Given 12/03/18 2026)           Procedures        Medical Decision Making     Patient presented here with complaints of shortness of breath, however I cannot find really any source for it.  Attentively she was in CHF but the patient does not appear to be  clinically  volume overloaded.  Her lungs are clear on both x-ray as well as CT.  She does not have a PE.  Does not sound like she is wheezing.  She does not appear to be anemic.  Troponin was normal and since this is been going on for so long, I would have expected  it to be elevated if it was ischemic.  We ambulated the patient and her oxygen saturation stayed above 98%.  I have advised her to follow-up with her cardiologist as an outpatient and have given her his updated information.       This patient was evaluated in the emergency department for symptoms described in the history of present illness.  Patient was evaluated in the content of global COVID-19 pandemic, which necessitated consideration of the patient may be at risk for infection  with the SARS-COV-2 virus that causes COVID-19.  Institutional protocols and algorithms that pertain to the evaluation of patients at risk for COVID-19 are in a state of rapid change based on the information released by regulatory bodies including the  CDC and federal state organizations.  These policies and algorithms were followed during the patient's care in the emergency department.  Patient was swabbed accordingly and will be sent to outpatient lab.Patient will be discharged home with strict return  precautions and strict orders to quarantine.I have given the patient extensive literature on home quarantine, self-isolation,  social distancing, and how to care for self and others with COVID-19.   Patient remained stable here in the emergency room and is appropriate for outpatient follow-up and discharged home      I have counseled the patient on the signs and symptoms of COVID-19, and the importance of keeping oneself separated from others by isolation and self quarantine, particularly while awaiting test results.  I stressed the importance of informing close contacts  that the patient is being tested, that if it comes back positive, the patient will need to let all close  contacts know. as well as the health department so that they can do contact tracing.  We reviewed the services that may be available to assist the  patient in successful isolation and quarantining.      Patient was discharged home for self quarantine           Final Diagnosis                 ICD-10-CM  ICD-9-CM          1.  SOB (shortness of breath)   R06.02  786.05     2.  Orthopnea   R06.01  786.02     3.  Chronic systolic heart failure (HCC)   I50.22  428.22          4.  Nonischemic cardiomyopathy ([place])   I42.8  425.4             Disposition     Discharged home        Discharge Medication List as of 12/03/2018  9:32 PM                  Type(s) of Personal Protection Equipment (PPE) worn during exam:  100 mask, eyeshield, gloves      Kathryn [name], MPA, PA-C   12/03/2018   5:36 PM      The patient was personally evaluated by myself and [facility], Jeanine Luz, MD who agrees with the above assessment and plan.      My signature above authenticates this document and my orders, the final diagnosis(es), discharge prescription (s), and instructions in the Epic record. If you have any questions please contact [phone].       Nursing notes have been reviewed by the Physician/Advanced Practice Clinician. Dragon medical dictation software was used for portions of this report. Unintended voice recognition errors may occur.

## 2018-12-03 NOTE — ED Notes (Signed)
Weakness generalized and shortness of breath for 3 days, worse with exertion. Room air sats 96-98.

## 2018-12-04 LAB — EKG 12-LEAD
Atrial Rate: 82 {beats}/min
Diagnosis: NORMAL
P Axis: 53 degrees
P-R Interval: 128 ms
Q-T Interval: 402 ms
QRS Duration: 112 ms
QTc Calculation (Bazett): 469 ms
R Axis: 44 degrees
T Axis: 88 degrees
Ventricular Rate: 82 {beats}/min

## 2018-12-04 LAB — EKG, 12 LEAD, INITIAL
Atrial Rate: 82 {beats}/min
Calculated P Axis: 53 degrees
Calculated R Axis: 44 degrees
Calculated T Axis: 88 degrees
Diagnosis: NORMAL
P-R Interval: 128 ms
Q-T Interval: 402 ms
QRS Duration: 112 ms
QTC Calculation (Bezet): 469 ms
Ventricular Rate: 82 {beats}/min

## 2018-12-04 MED ORDER — IOPAMIDOL 61 % IV SOLN
61 % | Freq: Once | INTRAVENOUS | Status: AC
Start: 2018-12-04 — End: 2018-12-03
  Administered 2018-12-04: via INTRAVENOUS

## 2018-12-04 MED FILL — ISOVUE-300  61 % INTRAVENOUS SOLUTION: 300 mg iodine /mL (61 %) | INTRAVENOUS | Qty: 100

## 2018-12-06 LAB — COVID-19
SARS-CoV-2 RNA: NOT DETECTED
Sars-Cov-2 RNA: NOT DETECTED

## 2018-12-08 ENCOUNTER — Encounter

## 2018-12-08 ENCOUNTER — Inpatient Hospital Stay: Admit: 2018-12-08 | Payer: MEDICARE | Attending: Internal Medicine | Primary: Internal Medicine

## 2018-12-08 DIAGNOSIS — R1900 Intra-abdominal and pelvic swelling, mass and lump, unspecified site: Secondary | ICD-10-CM

## 2018-12-08 MED ORDER — IOPAMIDOL 61 % IV SOLN
61 % | Freq: Once | INTRAVENOUS | Status: DC
Start: 2018-12-08 — End: 2018-12-09

## 2018-12-08 MED FILL — ISOVUE-300  61 % INTRAVENOUS SOLUTION: 300 mg iodine /mL (61 %) | INTRAVENOUS | Qty: 100

## 2020-05-02 ENCOUNTER — Inpatient Hospital Stay
Admit: 2020-05-02 | Discharge: 2020-05-02 | Disposition: A | Discharge status: Home / Self Care | Payer: MEDICARE | Attending: Emergency Medicine

## 2020-05-02 ENCOUNTER — Emergency Department: Admit: 2020-05-02 | Payer: MEDICARE | Primary: Internal Medicine

## 2020-05-02 DIAGNOSIS — R079 Chest pain, unspecified: Secondary | ICD-10-CM

## 2020-05-02 LAB — BASIC METABOLIC PANEL
Anion Gap: 8 mmol/L (ref 5–15)
BUN: 27 mg/dl — ABNORMAL HIGH (ref 7–25)
CO2: 23 mEq/L (ref 21–32)
Calcium: 10.8 mg/dl — ABNORMAL HIGH (ref 8.5–10.1)
Chloride: 104 mEq/L (ref 98–107)
Creatinine: 1.8 mg/dl — ABNORMAL HIGH (ref 0.6–1.3)
EGFR IF NonAfrican American: 29
GFR African American: 35
Glucose: 166 mg/dl — ABNORMAL HIGH (ref 74–106)
Potassium: 3.4 mEq/L — ABNORMAL LOW (ref 3.5–5.1)
Sodium: 135 mEq/L — ABNORMAL LOW (ref 136–145)

## 2020-05-02 LAB — CBC WITH AUTO DIFFERENTIAL
Basophils %: 1 % (ref 0–3)
Eosinophils %: 3.3 % (ref 0–5)
Hematocrit: 37.1 % (ref 37.0–50.0)
Hemoglobin: 12.5 gm/dl — ABNORMAL LOW (ref 13.0–17.2)
Immature Granulocytes: 0.2 % (ref 0.0–3.0)
Lymphocytes %: 29.2 % (ref 28–48)
MCH: 30.9 pg (ref 25.4–34.6)
MCHC: 33.7 gm/dl (ref 30.0–36.0)
MCV: 91.8 fL (ref 80.0–98.0)
MPV: 8.8 fL (ref 6.0–10.0)
Monocytes %: 7.4 % (ref 1–13)
Neutrophils %: 58.9 % (ref 34–64)
Nucleated RBCs: 0 (ref 0–0)
Platelets: 239 10*3/uL (ref 140–450)
RBC: 4.04 M/uL (ref 3.60–5.20)
RDW-SD: 44.3 (ref 36.4–46.3)
WBC: 8.2 10*3/uL (ref 4.0–11.0)

## 2020-05-02 LAB — EKG 12-LEAD
Atrial Rate: 106 {beats}/min
P Axis: 60 degrees
P-R Interval: 126 ms
Q-T Interval: 366 ms
QRS Duration: 104 ms
QTc Calculation (Bazett): 486 ms
R Axis: 93 degrees
T Axis: 29 degrees
Ventricular Rate: 106 {beats}/min

## 2020-05-02 LAB — TROPONIN, HIGH SENSITIVITY
Troponin, High Sensitivity: 11 ng/L (ref 0–59)
Troponin, High Sensitivity: 9 ng/L (ref 0–59)

## 2020-05-02 LAB — D-DIMER, QUANTITATIVE: D-Dimer, Quant: 0.49 ug/mL (FEU) (ref 0.01–0.50)

## 2020-05-02 LAB — CBC WITH AUTOMATED DIFF
BASOPHILS: 1 % (ref 0–3)
EOSINOPHILS: 3.3 % (ref 0–5)
HCT: 37.1 % (ref 37.0–50.0)
HGB: 12.5 gm/dl — ABNORMAL LOW (ref 13.0–17.2)
IMMATURE GRANULOCYTES: 0.2 % (ref 0.0–3.0)
LYMPHOCYTES: 29.2 % (ref 28–48)
MCH: 30.9 pg (ref 25.4–34.6)
MCHC: 33.7 gm/dl (ref 30.0–36.0)
MCV: 91.8 fL (ref 80.0–98.0)
MONOCYTES: 7.4 % (ref 1–13)
MPV: 8.8 fL (ref 6.0–10.0)
NEUTROPHILS: 58.9 % (ref 34–64)
NRBC: 0 (ref 0–0)
PLATELET: 239 10*3/uL (ref 140–450)
RBC: 4.04 M/uL (ref 3.60–5.20)
RDW-SD: 44.3 (ref 36.4–46.3)
WBC: 8.2 10*3/uL (ref 4.0–11.0)

## 2020-05-02 LAB — METABOLIC PANEL, BASIC
Anion gap: 8 mmol/L (ref 5–15)
BUN: 27 mg/dl — ABNORMAL HIGH (ref 7–25)
CO2: 23 mEq/L (ref 21–32)
Calcium: 10.8 mg/dl — ABNORMAL HIGH (ref 8.5–10.1)
Chloride: 104 mEq/L (ref 98–107)
Creatinine: 1.8 mg/dl — ABNORMAL HIGH (ref 0.6–1.3)
GFR est AA: 35
GFR est non-AA: 29
Glucose: 166 mg/dl — ABNORMAL HIGH (ref 74–106)
Potassium: 3.4 mEq/L — ABNORMAL LOW (ref 3.5–5.1)
Sodium: 135 mEq/L — ABNORMAL LOW (ref 136–145)

## 2020-05-02 LAB — D DIMER: D DIMER: 0.49 ug/mL (FEU) (ref 0.01–0.50)

## 2020-05-02 LAB — EKG, 12 LEAD, INITIAL
Atrial Rate: 106 {beats}/min
Calculated P Axis: 60 degrees
Calculated R Axis: 93 degrees
Calculated T Axis: 29 degrees
P-R Interval: 126 ms
Q-T Interval: 366 ms
QRS Duration: 104 ms
QTC Calculation (Bezet): 486 ms
Ventricular Rate: 106 {beats}/min

## 2020-05-02 LAB — TROPONIN-HIGH SENSITIVITY
Troponin-High Sensitivity: 11 ng/L (ref 0–59)
Troponin-High Sensitivity: 9 ng/L (ref 0–59)

## 2020-05-02 MED ORDER — SODIUM CHLORIDE 0.9% BOLUS IV
0.9 % | INTRAVENOUS | Status: AC
Start: 2020-05-02 — End: 2020-05-02
  Administered 2020-05-02: 10:00:00 via INTRAVENOUS

## 2020-05-02 MED ORDER — SODIUM CHLORIDE 0.9 % IJ SYRG
Freq: Once | INTRAMUSCULAR | Status: AC
Start: 2020-05-02 — End: 2020-05-02
  Administered 2020-05-02: 09:00:00 via INTRAVENOUS

## 2020-05-02 MED ORDER — PANTOPRAZOLE 40 MG TAB, DELAYED RELEASE
40 mg | ORAL_TABLET | Freq: Every day | ORAL | 0 refills | Status: AC
Start: 2020-05-02 — End: 2020-05-22

## 2020-05-02 NOTE — ED Notes (Signed)
Patient transported via wheelchair to xray.

## 2020-05-02 NOTE — ED Notes (Signed)
Report given to RN Hailey

## 2020-05-02 NOTE — ED Notes (Signed)
Pt arrived via EMS from home. C/o chest pain. Pt denies chest pain on arrival.

## 2020-05-02 NOTE — ED Provider Notes (Signed)
[facility]  Emergency Department Treatment Report    Patient: Kathryn [name] Age: 80 y.o. Sex: female    Date of Birth: 05-01-1940 Admit Date: 05/02/2020 PCP: Royanne Foots, MD   MRN: [id number]  CSN: [id number]     Room: ER34/ER34 Time Dictated: 4:06 AM      Dragon medical dictation software was used for portions of this report.  Unintended transcription errors may occur.    Chief Complaint   Chest pain  History of Present Illness   80 y.o. female brought in today for chest pain via the medics.  Patient herself has some dementia, she cannot really give you any history.  She does not recall having any chest pain.  History comes via medics and via her husband.  Reports her husband is that she started complaining of chest discomfort 4 hours ago.  He says that she has a history of heart failure but has never had any myocardial infarctions.  Says she does not yell out in pain, she does have a pacer/AICD.  Does not seem to be getting shocks.  She has not been ill recently, and she has had no other complaints that she reports to him.  She arrives pain-free, stating that she feels "just fine".    Review of Systems   Review of Systems   Unable to perform ROS: Dementia       Past Medical/Surgical History     Past Medical History:   Diagnosis Date   ??? Arrhythmia    ??? Arthritis    ??? CAD (coronary artery disease)    ??? Cancer ([place])     Left breast   ??? Diabetes ([place])    ??? GERD (gastroesophageal reflux disease)    ??? Heart failure ([place])    ??? Hypertension    ??? ICD (implantable cardioverter-defibrillator) battery depletion 06/28/2014    Implants: EXPLANTED DEVICE ST JUDE [id number] DOI 06/12/08 NEW DEVICE [facility] JUDE UNIFY ASSURA [id number] RA LEAD ST [facility] DG:[id number] RV LEAD ST JUDE [id number] IR:[id number] LV LEAD 1156T AY:[id number] PROGRAMMED DDD 60-130 VT 1 150 MONITOR VT 2 171 ATPX2,30J,40J VF 200 ATP,30J,40J     Past Surgical History:   Procedure Laterality Date   ??? BREAST SURGERY PROCEDURE  UNLISTED      Left Mastectomy   ??? COLONOSCOPY N/A 10/22/2016    COLONOSCOPY, SURVEILLANCE performed by Jackalyn Lombard, MD at [facility]   ??? HX GYN      Hysterectomy   ??? HX HEENT      Tosillectomy   ??? HX PACEMAKER  2010    [facility]       Social History     Social History     Socioeconomic History   ??? Marital status: MARRIED   Tobacco Use   ??? Smoking status: Former Smoker     Packs/day: 0.25     Years: 0.50     Pack years: 0.12     Quit date: 07/01/1967     Years since quitting: 52.8   ??? Smokeless tobacco: Never Used   Substance and Sexual Activity   ??? Alcohol use: No   ??? Drug use: No       Family History     Family History   Problem Relation Age of Onset   ??? Diabetes Mother    ??? Hypertension Mother    ??? Diabetes Father    ??? Hypertension Father    ??? Stroke Father  Current Medications     Current Outpatient Medications   Medication Sig Dispense Refill   ??? ALPRAZolam (XANAX) 0.5 mg tablet Take 0.5 mg by mouth nightly as needed for Anxiety.     ??? citalopram (CELEXA) 20 mg tablet Take 20 mg by mouth daily.     ??? allopurinol (ZYLOPRIM) 100 mg tablet Take 100 mg by mouth daily.     ??? losartan (COZAAR) 25 mg tablet Take 25 mg by mouth daily.     ??? furosemide (LASIX) 20 mg tablet Take 20 mg by mouth daily.     ??? gabapentin (NEURONTIN) 300 mg tablet Take 300 mg by mouth daily.     ??? metFORMIN (GLUCOPHAGE) 500 mg tablet Take 500 mg by mouth daily (with breakfast).     ??? carvedilol (COREG) 3.125 mg tablet Take 3.125 mg by mouth two (2) times a day. 2TABS IN AM, 1 IN THE EVENING     ??? spironolactone (ALDACTONE) 25 mg tablet Take 25 mg by mouth daily. 1/2 TAB DAILY     ??? simvastatin (ZOCOR) 20 mg tablet Take 20 mg by mouth nightly.     ??? Cholecalciferol, Vitamin D3, 5,000 unit Tab Take  by mouth daily.     ??? aspirin 81 mg tablet Take 81 mg by mouth daily.         Allergies     Allergies   Allergen Reactions   ??? Pcn [Penicillins] Itching   ??? Sulfa (Sulfonamide Antibiotics) Hives       Physical Exam     Visit Vitals  BP 138/72    Pulse (!) 104   Temp 97.7 ??F (36.5 ??C)   Resp 18   SpO2 100%     Physical Exam  Constitutional:       General: She is not in acute distress.     Appearance: She is not diaphoretic.   HENT:      Head: Normocephalic and atraumatic.   Eyes:      General:         Right eye: No discharge.         Left eye: No discharge.      Conjunctiva/sclera: Conjunctivae normal.      Pupils: Pupils are equal, round, and reactive to light.   Neck:      Trachea: No tracheal deviation.   Cardiovascular:      Rate and Rhythm: Normal rate.      Heart sounds: Normal heart sounds. No murmur heard.  No friction rub. No gallop.    Pulmonary:      Effort: Pulmonary effort is normal. No respiratory distress.      Breath sounds: Normal breath sounds. No stridor. No wheezing or rales.   Abdominal:      General: Bowel sounds are normal. There is no distension.      Palpations: Abdomen is soft. There is no mass.      Tenderness: There is no abdominal tenderness. There is no guarding or rebound.   Musculoskeletal:         General: No deformity. Normal range of motion.      Cervical back: Normal range of motion and neck supple.      Right lower leg: No tenderness. No edema.      Left lower leg: No tenderness. No edema.   Skin:     General: Skin is warm and dry.      Coloration: Skin is not pale.      Findings: No  erythema or rash.   Neurological:      General: No focal deficit present.      Mental Status: She is alert.   Psychiatric:         Mood and Affect: Affect normal.         Impression and Management Plan   Very pleasant 80 year old female with some dementia here today for reported chest pain.  She has no active symptoms, she is 80 years old with, per records, nonischemic cardiomyopathy.  Will check basic labs, troponin, chest x-ray, she has a mild tachycardia on arrival to screening D-dimer is differential would include ACS, PE, pneumothorax, pneumonia.     Diagnostic Studies   Lab:   Recent Results (from the past 12 hour(s))   CBC WITH  AUTOMATED DIFF    Collection Time: 05/02/20  4:10 AM   Result Value Ref Range    WBC 8.2 4.0 - 11.0 1000/mm3    RBC 4.04 3.60 - 5.20 M/uL    HGB 12.5 (L) 13.0 - 17.2 gm/dl    HCT 22.0 25.4 - 27.0 %    MCV 91.8 80.0 - 98.0 fL    MCH 30.9 25.4 - 34.6 pg    MCHC 33.7 30.0 - 36.0 gm/dl    PLATELET 623 762 - 831 1000/mm3    MPV 8.8 6.0 - 10.0 fL    RDW-SD 44.3 36.4 - 46.3      NRBC 0 0 - 0      IMMATURE GRANULOCYTES 0.2 0.0 - 3.0 %    NEUTROPHILS 58.9 34 - 64 %    LYMPHOCYTES 29.2 28 - 48 %    MONOCYTES 7.4 1 - 13 %    EOSINOPHILS 3.3 0 - 5 %    BASOPHILS 1.0 0 - 3 %   METABOLIC PANEL, BASIC    Collection Time: 05/02/20  4:10 AM   Result Value Ref Range    Sodium 135 (L) 136 - 145 mEq/L    Potassium 3.4 (L) 3.5 - 5.1 mEq/L    Chloride 104 98 - 107 mEq/L    CO2 23 21 - 32 mEq/L    Glucose 166 (H) 74 - 106 mg/dl    BUN 27 (H) 7 - 25 mg/dl    Creatinine 1.8 (H) 0.6 - 1.3 mg/dl    GFR est AA 51.7      GFR est non-AA 29      Calcium 10.8 (H) 8.5 - 10.1 mg/dl    Anion gap 8 5 - 15 mmol/L   TROPONIN-HIGH SENSITIVITY    Collection Time: 05/02/20  4:10 AM   Result Value Ref Range    Troponin-High Sensitivity 11 0 - 59 ng/L   D DIMER    Collection Time: 05/02/20  4:10 AM   Result Value Ref Range    D DIMER 0.49 0.01 - 0.50 ug/mL (FEU)       Imaging:    XR CHEST PA LAT    Result Date: 05/02/2020  Clinical history: Chest pain EXAMINATION: PA and lateral views of the chest 05/02/2020 Correlation: CT scan 12/03/2018 and chest radiograph 12/03/2018 FINDINGS: Trachea and heart size are within normal limits. Calcification of the aortic arch. Lungs are clear. Pacemaker leads project in expected location of the right atrium, ventricle and coronary sinus.     IMPRESSION: No acute pulmonary process.       Imaging:  My interpretation of the chest x-ray is that it is negative for acute intrathoracic anomaly.  EKG:  My interpretation of the EKG is that it shows sinus tachycardia with no ST elevations, depressions, unchanged from EKG from  12/03/2018.        Cardiac Monitor Rhythm Strip:  Ordered for chest pain.  My interpretation is that it shows regular rate and intermittently paced rhythm    Medical Decision Making/ED Course     ED Course as of 05/02/20 0550   Wed May 02, 2020   0505 Troponin is negative, on reevaluation the patient still is pain-free, she states that she feels very well.  I discussed the EKG findings, the troponin testing, the negative D-dimer with the patient and husband.  We discussed options of repeat troponin testing, admission for serial troponins and stress testing, they would prefer to go home and think that is reasonable.  We will do a delta troponin and if negative plan for discharge to home. [JR]   4034 At time of signout delta troponin is pending.  Patient is signed out to Dr. Tito Dine with repeat lab pending and plan for discharge to home if negative. [JR]      ED Course User Index  [JR] Mora Bellman, MD           Final Diagnosis       ICD-10-CM ICD-9-CM   1. Chest pain, unspecified type Active R07.9 786.50     Disposition   Pending    Mora Bellman, MD  May 02, 2020    My signature above authenticates this document and my orders, the final    diagnosis (es), discharge prescription (s), and instructions in the Epic    record.  If you have any questions please contact [phone].

## 2020-05-02 NOTE — ED Notes (Signed)
I have reviewed discharge instructions with the patient.  The patient verbalized understanding.

## 2020-05-02 NOTE — ED Notes (Signed)
6:30 AM: Pt care assumed from Dr. Jacklynn Barnacle, ED provider. Pt complaint(s), current treatment plan, progression and available diagnostic results have been discussed thoroughly.  Rounding occurred: yes  Intended Disposition: pending   Pending diagnostic reports and/or labs (please list): delta troponin    7:45 AM: Delta troponin is unchanged, patient remained stable, patient and her husband desire discharge home with outpatient primary care follow-up.  I have a low suspicion for ACS given her history and EKG and normal initial and delta troponin.  Will give empiric Protonix for dyspepsia, return precautions, outpatient primary care follow-up.    Susa Raring, MD

## 2020-05-02 NOTE — ED Notes (Signed)
Assisted pt to the bathroom, informed pt all lab work is back and should be updated shortly, pt and family verbalized understanding

## 2020-07-20 ENCOUNTER — Other Ambulatory Visit: Payer: Self-pay

## 2020-07-20 ENCOUNTER — Ambulatory Visit (INDEPENDENT_AMBULATORY_CARE_PROVIDER_SITE_OTHER): Payer: Medicare Other | Admitting: Family

## 2020-07-20 ENCOUNTER — Encounter: Payer: Self-pay | Admitting: Family

## 2020-07-20 VITALS — BP 140/82 | HR 78 | Temp 98.4°F | Ht 67.0 in | Wt 116.2 lb

## 2020-07-20 DIAGNOSIS — Z720 Tobacco use: Secondary | ICD-10-CM

## 2020-07-20 DIAGNOSIS — E785 Hyperlipidemia, unspecified: Secondary | ICD-10-CM | POA: Insufficient documentation

## 2020-07-20 DIAGNOSIS — M810 Age-related osteoporosis without current pathological fracture: Secondary | ICD-10-CM | POA: Diagnosis not present

## 2020-07-20 DIAGNOSIS — E782 Mixed hyperlipidemia: Secondary | ICD-10-CM

## 2020-07-20 DIAGNOSIS — I10 Essential (primary) hypertension: Secondary | ICD-10-CM

## 2020-07-20 DIAGNOSIS — Z1231 Encounter for screening mammogram for malignant neoplasm of breast: Secondary | ICD-10-CM

## 2020-07-20 DIAGNOSIS — R5383 Other fatigue: Secondary | ICD-10-CM

## 2020-07-20 LAB — CBC WITH DIFFERENTIAL/PLATELET
Basophils Absolute: 0.1 10*3/uL (ref 0.0–0.1)
Basophils Relative: 0.7 % (ref 0.0–3.0)
Eosinophils Absolute: 0 10*3/uL (ref 0.0–0.7)
Eosinophils Relative: 0.2 % (ref 0.0–5.0)
HCT: 37.5 % (ref 36.0–46.0)
Hemoglobin: 12.7 g/dL (ref 12.0–15.0)
Lymphocytes Relative: 12.2 % (ref 12.0–46.0)
Lymphs Abs: 1 10*3/uL (ref 0.7–4.0)
MCHC: 34 g/dL (ref 30.0–36.0)
MCV: 95 fl (ref 78.0–100.0)
Monocytes Absolute: 0.8 10*3/uL (ref 0.1–1.0)
Monocytes Relative: 9 % (ref 3.0–12.0)
Neutro Abs: 6.6 10*3/uL (ref 1.4–7.7)
Neutrophils Relative %: 77.9 % — ABNORMAL HIGH (ref 43.0–77.0)
Platelets: 201 10*3/uL (ref 150.0–400.0)
RBC: 3.95 Mil/uL (ref 3.87–5.11)
RDW: 15.2 % (ref 11.5–15.5)
WBC: 8.5 10*3/uL (ref 4.0–10.5)

## 2020-07-20 LAB — LIPID PANEL
Cholesterol: 195 mg/dL (ref 0–200)
HDL: 107.7 mg/dL (ref >39.00)
LDL Cholesterol: 76 mg/dL (ref 0–99)
NonHDL: 86.95
Total CHOL/HDL Ratio: 2
Triglycerides: 56 mg/dL (ref 0.0–149.0)
VLDL: 11.2 mg/dL (ref 0.0–40.0)

## 2020-07-20 LAB — COMPREHENSIVE METABOLIC PANEL
ALT: 15 U/L (ref 0–35)
AST: 23 U/L (ref 0–37)
Albumin: 4.4 g/dL (ref 3.5–5.2)
Alkaline Phosphatase: 72 U/L (ref 39–117)
BUN: 26 mg/dL — ABNORMAL HIGH (ref 6–23)
CO2: 31 mEq/L (ref 19–32)
Calcium: 11.5 mg/dL — ABNORMAL HIGH (ref 8.4–10.5)
Chloride: 96 mEq/L (ref 96–112)
Creatinine, Ser: 1.13 mg/dL (ref 0.40–1.20)
GFR: 46.33 mL/min — ABNORMAL LOW (ref >60.00)
Glucose, Bld: 89 mg/dL (ref 70–99)
Potassium: 4.8 mEq/L (ref 3.5–5.1)
Sodium: 137 mEq/L (ref 135–145)
Total Bilirubin: 0.7 mg/dL (ref 0.2–1.2)
Total Protein: 7.2 g/dL (ref 6.0–8.3)

## 2020-07-20 LAB — VITAMIN D 25 HYDROXY (VIT D DEFICIENCY, FRACTURES): VITD: 49.56 ng/mL (ref 30.00–100.00)

## 2020-07-20 LAB — TSH: TSH: 2.09 u[IU]/mL (ref 0.35–4.50)

## 2020-07-20 MED ORDER — ATORVASTATIN CALCIUM 20 MG PO TABS: 1.0000 | ORAL_TABLET | Freq: Every morning | ORAL | 3 refills | Status: DC

## 2020-07-20 MED ORDER — HYDROCHLOROTHIAZIDE 25 MG PO TABS: 1.0000 | ORAL_TABLET | Freq: Every morning | ORAL | 3 refills | Status: DC

## 2020-07-20 NOTE — Progress Notes (Signed)
Mary Fitzpatrick is a 80 y.o. female with the following history as recorded in Epic[facility]:  Patient Active Problem List   Diagnosis Date Noted  . Hypertension 07/20/2020  . Hyperlipidemia 07/20/2020  . Osteoporosis 07/20/2020  . Tobacco abuse 07/20/2020    Current Outpatient Medications  Medication Sig Dispense Refill  . Calcium Carbonate-Vitamin D (CALTRATE 600+D PO) Take by mouth.    Marland Kitchen atorvastatin (LIPITOR) 20 MG tablet Take 1 tablet (20 mg total) by mouth every morning. 90 tablet 3  . hydrochlorothiazide (HYDRODIURIL) 25 MG tablet Take 1 tablet (25 mg total) by mouth every morning. 90 tablet 3   No current facility-administered medications for this visit.    Allergies: Patient has no known allergies.  No past medical history on file.   The histories are not reviewed yet. Please review them in the "History" navigator section and refresh this [place].  No family history on file.  Social History   Tobacco Use  . Smoking status: Current Every Day Smoker    Types: Cigarettes  . Smokeless tobacco: Never Used  Substance Use Topics  . Alcohol use: Yes    Alcohol/week: 1.0 standard drink    Types: 1 Glasses of wine per week    Comment: most nights    Subjective:   Patient presents today as a new patient; has recently moved to the area- has son in [place] and decided to move closer to family  ; history of hypertension and hyperlipidemia and osteoporosis; does not have any records with her available for review today;   Notes that her blood pressure is "always up when I go to the doctor"- known history of white coat hypertension; has been stable on HCTZ for 7 years;   Last DEXA in 2020- due for repeat; overdue for mammogram;   Smokes 1 pack per week- aware that needs to quit but wants to get her stress level under control; 30 years; aware of lung cancer CT screen but is not interested;   Was told that could stop colonoscopy at age 20- no history with polyps;   Has lost 13  pounds when comparing weight from OV in 01/2020; per patient, she feels it is because she has been eating more healthy/ less alcohol/ more active with move; no fever, night sweats or nausea;   Objective:  Vitals:   07/20/20 0952  BP: 140/82  Pulse: 78  Temp: 98.4 F (36.9 C)  TempSrc: Oral  SpO2: 99%  Weight: 116 lb 3.2 oz (52.7 kg)  Height: '5\' 7"'  (1.702 m)    General: Well developed, well nourished, in no acute distress  Skin : Warm and dry.  Head: Normocephalic and atraumatic  Eyes: Sclera and conjunctiva clear; pupils round and reactive to light; extraocular movements intact  Ears: External normal; canals clear; tympanic membranes normal  Oropharynx: Pink, supple. No suspicious lesions  Neck: Supple without thyromegaly, adenopathy  Lungs: Respirations unlabored; clear to auscultation bilaterally without wheeze, rales, rhonchi  CVS exam: normal rate and regular rhythm.  Abdomen: Soft; nontender; nondistended; normoactive bowel sounds; no masses or hepatosplenomegaly  Musculoskeletal: No deformities; no active joint inflammation  Extremities: No edema, cyanosis, clubbing  Vessels: Symmetric bilaterally  Neurologic: Alert and oriented; speech intact; face symmetrical; moves all extremities well; CNII-XII intact without focal deficit   Assessment:  1. Screening mammogram for breast cancer   2. Osteoporosis, unspecified osteoporosis type, unspecified pathological fracture presence   3. Essential hypertension   4. Mixed hyperlipidemia   5. Other fatigue  6. Primary hypertension   7. Osteoporosis without current pathological fracture, unspecified osteoporosis type   8. Tobacco abuse     Plan:  1. Order updated; 2. Order updated; has completed Fosamax x 5 years; 3. Stable; refill updated; known white coat HTN; encouraged to start checking blood pressure regularly at home; 4. Check lipid panel; refill updated; 5. Concern for weight loss- update TSH; to consider CXR but patient  defers today; feels weight loss correlates with lifestyle change;  8. Planning to quit smoking; does not want lung cancer CT at this time;  This visit occurred during the SARS-CoV-2 public health emergency.  Safety protocols were in place, including screening questions prior to the visit, additional usage of staff PPE, and extensive cleaning of exam room while observing appropriate contact time as indicated for disinfecting solutions.      Return in about 3 months (around 10/20/2020).  Orders Placed This Encounter  Procedures  . MM Digital Screening    Standing Status:   Future    Standing Expiration Date:   07/20/2021    Order Specific Question:   Reason for Exam (SYMPTOM  OR DIAGNOSIS REQUIRED)    Answer:   screening mammogram    Order Specific Question:   Preferred imaging location?    Answer:   [facility]  . DG Bone Density    Standing Status:   Future    Standing Expiration Date:   07/20/2021    Scheduling Instructions:     Please try to schedule DEXA with mammogram    Order Specific Question:   Reason for Exam (SYMPTOM  OR DIAGNOSIS REQUIRED)    Answer:   osteoporosis    Order Specific Question:   Preferred imaging location?    Answer:   [facility]  . CBC with Differential/Platelet  . Comp Met (CMET)  . Lipid panel  . TSH  . Vitamin D (25 hydroxy)    Requested Prescriptions   Signed Prescriptions Disp Refills  . atorvastatin (LIPITOR) 20 MG tablet 90 tablet 3    Sig: Take 1 tablet (20 mg total) by mouth every morning.  . hydrochlorothiazide (HYDRODIURIL) 25 MG tablet 90 tablet 3    Sig: Take 1 tablet (25 mg total) by mouth every morning.

## 2020-07-23 ENCOUNTER — Telehealth: Payer: Self-pay | Admitting: Family

## 2020-07-23 NOTE — Telephone Encounter (Signed)
I have called pt and relayed the provider message below. I have also scheduled her lab visit only for next mon at 2pm. Pt already has an OV scheduled for Aug of 2022.

## 2020-07-23 NOTE — Telephone Encounter (Signed)
It was very nice to meet her last week. Overall, her labs look stable; her calcium level was up slightly; this may be normal for her but I would like to re-check this and a parathyroid level just to make sure we are not missing something to explain the weight loss. Can you make her a lab appointment please? Let's also plan for an OV for weight check in about 3 months.

## 2020-07-30 ENCOUNTER — Other Ambulatory Visit: Payer: Self-pay

## 2020-07-30 ENCOUNTER — Other Ambulatory Visit (INDEPENDENT_AMBULATORY_CARE_PROVIDER_SITE_OTHER): Payer: Medicare Other

## 2020-08-01 ENCOUNTER — Other Ambulatory Visit: Payer: Self-pay | Admitting: Family

## 2020-08-01 LAB — PTH, INTACT AND CALCIUM
Calcium: 11.5 mg/dL — ABNORMAL HIGH (ref 8.6–10.4)
PTH: 47 pg/mL (ref 16–77)

## 2020-08-06 ENCOUNTER — Ambulatory Visit (HOSPITAL_BASED_OUTPATIENT_CLINIC_OR_DEPARTMENT_OTHER)
Admission: RE | Admit: 2020-08-06 | Discharge: 2020-08-06 | Disposition: A | Discharge status: Home / Self Care | Payer: Medicare Other | Source: Ambulatory Visit | Attending: Family | Admitting: Family

## 2020-08-06 ENCOUNTER — Other Ambulatory Visit: Payer: Self-pay

## 2020-08-06 ENCOUNTER — Encounter (HOSPITAL_BASED_OUTPATIENT_CLINIC_OR_DEPARTMENT_OTHER): Payer: Self-pay

## 2020-08-06 DIAGNOSIS — Z1231 Encounter for screening mammogram for malignant neoplasm of breast: Secondary | ICD-10-CM

## 2020-08-06 DIAGNOSIS — M810 Age-related osteoporosis without current pathological fracture: Secondary | ICD-10-CM | POA: Insufficient documentation

## 2020-09-11 ENCOUNTER — Other Ambulatory Visit: Payer: Self-pay

## 2020-09-11 ENCOUNTER — Ambulatory Visit (INDEPENDENT_AMBULATORY_CARE_PROVIDER_SITE_OTHER): Payer: Medicare Other | Admitting: Internal Medicine

## 2020-09-11 ENCOUNTER — Encounter: Payer: Self-pay | Admitting: Internal Medicine

## 2020-09-11 DIAGNOSIS — I10 Essential (primary) hypertension: Secondary | ICD-10-CM | POA: Diagnosis not present

## 2020-09-11 MED ORDER — AMLODIPINE BESYLATE 2.5 MG PO TABS
2.5000 mg | ORAL_TABLET | Freq: Every day | ORAL | 6 refills | Status: DC
Start: 2020-09-11 — End: 2020-10-26

## 2020-09-11 NOTE — Progress Notes (Signed)
Name: Mary Fitzpatrick  MRN/ DOB: [id number], Jan 30, 1941    Age/ Sex: 80 y.o., female    PCP: Olive Bass, FNP   Reason for Endocrinology Evaluation: Hypercalcemia     Date of Initial Endocrinology Evaluation: 09/11/2020     HPI: [name] is a 80 y.o. female with a past medical history of Hypercalcemia. The patient presented for initial endocrinology clinic visit on 09/11/2020 for consultative assistance with her hypercalcemia .   Mary Fitzpatrick indicates that she was first diagnosed with hypercalcemia in 07/2020 with a serum calcium of 11.5 mg/dL ( noncorrected) and inappropriately normal PTH  at 47 pg/mL    Per pt she has heard this over the years.   She denies symptoms polydipsia, but endorses ployuria , denies constipation.She has occasional pain that she attributes to arthritis.   She denies use of over the counter calcium (including supplements, Tums, Rolaids, or other calcium containing antacids),or lithium  She is on  HCTZ  and Vitamin D 1000 iu daily      She denies  history of kidney stones, kidney disease, liver disease, granulomatous disease. She has a hx of osteoporosis  and was on Fosamax for ~ 5 yrs  and stopped in 2020 but no prior fractures. Daily dietary calcium intake: 2 serving. She has family history of osteoporosis (mother) , parathyroid disease, thyroid disease.    HISTORY:  Past Medical History: History reviewed. No pertinent past medical history. Past Surgical History:  Past Surgical History:  Procedure Laterality Date   BREAST EXCISIONAL BIOPSY Left    BREAST EXCISIONAL BIOPSY Right     Social History:  reports that she has been smoking cigarettes. She has never used smokeless tobacco. She reports current alcohol use of about 1.0 standard drink of alcohol per week. Family History: family history is not on file.   HOME MEDICATIONS: Allergies as of 09/11/2020   No Known Allergies      Medication List        Accurate as of September 11, 2020  1:32 PM. If you have any questions, ask your nurse or doctor.          STOP taking these medications    CALTRATE 600+D PO Stopped by: Scarlette Shorts, MD       TAKE these medications    atorvastatin 20 MG tablet Commonly known as: LIPITOR Take 1 tablet (20 mg total) by mouth every morning.   hydrochlorothiazide 25 MG tablet Commonly known as: HYDRODIURIL Take 1 tablet (25 mg total) by mouth every morning.          REVIEW OF SYSTEMS: A comprehensive ROS was conducted with the patient and is negative except as per HPI     OBJECTIVE:  VS: BP 126/88   Pulse 86   Ht 5\' 7"  (1.702 m)   Wt 119 lb (54 kg)   SpO2 99%   BMI 18.64 kg/m    Wt Readings from Last 3 Encounters:  09/11/20 119 lb (54 kg)  07/20/20 116 lb 3.2 oz (52.7 kg)     EXAM: General: Pt appears well and is in NAD  Hydration: Well-hydrated with moist mucous membranes and good skin turgor  Eyes: External eye exam normal without stare, lid lag or exophthalmos.  EOM intact.  PERRL.  Ears, Nose, Throat: Hearing: Grossly intact bilaterally Dental: Good dentition  Throat: Clear without mass, erythema or exudate  Neck: General: Supple without adenopathy. Thyroid: Thyroid size normal.  No goiter or nodules  appreciated. No thyroid bruit.  Lungs: Clear with good BS bilat with no rales, rhonchi, or wheezes  Heart: Auscultation: RRR.  Abdomen: Normoactive bowel sounds, soft, nontender, without masses or organomegaly palpable  Extremities: Gait and station: Normal gait  Digits and nails: No clubbing, cyanosis, petechiae, or nodes Head and neck: Normal alignment and mobility BL UE: Normal ROM and strength. BL LE: No pretibial edema normal ROM and strength.  Skin: Hair: Texture and amount normal with gender appropriate distribution Skin Inspection: No rashes, acanthosis nigricans/skin tags. No lipohypertrophy Skin Palpation: Skin temperature, texture, and thickness normal to palpation  Neuro:  Cranial nerves: II - XII grossly intact  Cerebellar: Normal coordination and movement; no tremor Motor: Normal strength throughout DTRs: 2+ and symmetric in UE without delay in relaxation phase  Mental Status: Judgment, insight: Intact Orientation: Oriented to time, place, and person Memory: Intact for recent and remote events Mood and affect: No depression, anxiety, or agitation     DATA REVIEWED:   Results for Mary Fitzpatrick (MRN [id number]) as of 09/11/2020 16:49  Ref. Range 07/20/2020 10:56 07/30/2020 13:52  Sodium Latest Ref Range: 135 - 145 mEq/L 137   Potassium Latest Ref Range: 3.5 - 5.1 mEq/L 4.8   Chloride Latest Ref Range: 96 - 112 mEq/L 96   CO2 Latest Ref Range: 19 - 32 mEq/L 31   Glucose Latest Ref Range: 70 - 99 mg/dL 89   BUN Latest Ref Range: 6 - 23 mg/dL 26 (H)   Creatinine Latest Ref Range: 0.40 - 1.20 mg/dL 6.43   Calcium Latest Ref Range: 8.6 - 10.4 mg/dL 32.9 (H) 51.8 (H)  Alkaline Phosphatase Latest Ref Range: 39 - 117 U/L 72   Albumin Latest Ref Range: 3.5 - 5.2 g/dL 4.4   AST Latest Ref Range: 0 - 37 U/L 23   ALT Latest Ref Range: 0 - 35 U/L 15   Total Protein Latest Ref Range: 6.0 - 8.3 g/dL 7.2   Total Bilirubin Latest Ref Range: 0.2 - 1.2 mg/dL 0.7   GFR Latest Ref Range: >60.00 mL/min 46.33 (L)   Total CHOL/HDL Ratio Unknown 2   Cholesterol Latest Ref Range: 0 - 200 mg/dL 841   HDL Cholesterol Latest Ref Range: >39.00 mg/dL 660.63   LDL (calc) Latest Ref Range: 0 - 99 mg/dL 76   NonHDL Unknown 01.60   Triglycerides Latest Ref Range: 0.0 - 149.0 mg/dL 10.9   VLDL Latest Ref Range: 0.0 - 40.0 mg/dL 32.3   VITD Latest Ref Range: 30.00 - 100.00 ng/mL 49.56   WBC Latest Ref Range: 4.0 - 10.5 K/uL 8.5   RBC Latest Ref Range: 3.87 - 5.11 Mil/uL 3.95   Hemoglobin Latest Ref Range: 12.0 - 15.0 g/dL 55.7   HCT Latest Ref Range: 36.0 - 46.0 % 37.5   MCV Latest Ref Range: 78.0 - 100.0 fl 95.0   MCHC Latest Ref Range: 30.0 - 36.0 g/dL 32.2   RDW Latest Ref  Range: 11.5 - 15.5 % 15.2   Platelets Latest Ref Range: 150.0 - 400.0 K/uL 201.0   Neutrophils Latest Ref Range: 43.0 - 77.0 % 77.9 (H)   Lymphocytes Latest Ref Range: 12.0 - 46.0 % 12.2   Monocytes Relative Latest Ref Range: 3.0 - 12.0 % 9.0   Eosinophil Latest Ref Range: 0.0 - 5.0 % 0.2   Basophil Latest Ref Range: 0.0 - 3.0 % 0.7   NEUT# Latest Ref Range: 1.4 - 7.7 K/uL 6.6   Lymphocyte # Latest Ref Range: 0.7 -  4.0 K/uL 1.0   Monocyte # Latest Ref Range: 0.1 - 1.0 K/uL 0.8   Eosinophils Absolute Latest Ref Range: 0.0 - 0.7 K/uL 0.0   Basophils Absolute Latest Ref Range: 0.0 - 0.1 K/uL 0.1   PTH, Intact Latest Ref Range: 16 - 77 pg/mL  47  TSH Latest Ref Range: 0.35 - 4.50 uIU/mL 2.09     ASSESSMENT/PLAN/RECOMMENDATIONS:   Primary Hyperparathyroidism   - We discussed D/D of primary hyperparathyroidism but FHH is another differential  - Patient needs further evaluation to determine surgical candidacy - To proceed with 24- hr urinary calcium excretion, will hold HCTZ for 4 weeks. In the meantime she may use amlodipine 2.5 mg daily     - Encouraged hydration  - AVOID CALCIUM SUPPLEMENTS, AVOID LOW CALCIUM DIET - Maintain normal dietary calcium intake (2-3 servings of dairy a day) - Continue Vitamin D 1000 iu daily   2. HTN:  - Will stop HCTZ to proceed with 24- hr urine collection  - Will start amlodipine 2.5 mg daily if BP > 140/90 mmhg      F/U in 3 months  Pt will have labs drawn on the day she bring urine sample     Signed electronically by: Lyndle Herrlich, MD  [facility] Endocrinology  [facility] [address] [place] [place], [place] [address] Phone: [phone] FAX: [phone]   CC: Olive Bass, FNP [address] [place] [address] Phone: [phone] Fax: [phone]   Return to Endocrinology clinic as below: Future Appointments  Date Time Provider Department Center  10/26/2020  9:20 AM  Olive Bass, FNP [facility]

## 2020-09-11 NOTE — Patient Instructions (Addendum)
-   Stay Hydrated  - Avoid over the counter calcium tablets  - Maintain 2-3 servings of calcium in your diet ( cheese/ yogurt etc) - Continue Vitamin D 1000 iu daily   - STOP Hydrochlorothiazide and collect urine at least 4 weeks after stopping the hydrochlorothiazide  - A prescription of Amlodipine 2.5 mg was sent to the pharmacy, start if blood pressure if over 140/90   24-Hour Urine Collection  You will be collecting your urine for a 24-hour period of time. Your timer starts with your first urine of the morning (For example - If you first pee at 9AM, your timer will start at 9AM) Throw away your first urine of the morning Collect your urine every time you pee for the next 24 hours STOP your urine collection 24 hours after you started the collection (For example - You would stop at 9AM the day after you started)    Let the staff know that blood work is needed the time you drop off the urine

## 2020-10-16 ENCOUNTER — Other Ambulatory Visit: Payer: Self-pay

## 2020-10-16 ENCOUNTER — Other Ambulatory Visit (INDEPENDENT_AMBULATORY_CARE_PROVIDER_SITE_OTHER): Payer: Medicare Other

## 2020-10-16 ENCOUNTER — Encounter

## 2020-10-16 LAB — BASIC METABOLIC PANEL
BUN: 15 mg/dL (ref 6–23)
CO2: 25 mEq/L (ref 19–32)
Calcium: 10.6 mg/dL — ABNORMAL HIGH (ref 8.4–10.5)
Chloride: 100 mEq/L (ref 96–112)
Creatinine, Ser: 0.91 mg/dL (ref 0.40–1.20)
GFR: 59.97 mL/min — ABNORMAL LOW (ref >60.00)
Glucose, Bld: 88 mg/dL (ref 70–99)
Potassium: 4.3 mEq/L (ref 3.5–5.1)
Sodium: 136 mEq/L (ref 135–145)

## 2020-10-16 LAB — VITAMIN D 25 HYDROXY (VIT D DEFICIENCY, FRACTURES): VITD: 55.21 ng/mL (ref 30.00–100.00)

## 2020-10-16 LAB — ALBUMIN: Albumin: 4.3 g/dL (ref 3.5–5.2)

## 2020-10-16 NOTE — Progress Notes (Signed)
Start date and time 10/15/2020 at 7:30 am End date and time 10/16/2020 at 7:30 am Volume 1700

## 2020-10-17 LAB — CALCIUM, URINE, 24 HOUR: Calcium, 24H Urine: 165 mg/24 h

## 2020-10-17 LAB — CALCIUM, IONIZED: Calcium, Ion: 5.82 mg/dL — ABNORMAL HIGH (ref 4.8–5.6)

## 2020-10-17 LAB — PARATHYROID HORMONE, INTACT (NO CA): PTH: 42 pg/mL (ref 16–77)

## 2020-10-17 LAB — CREATININE, URINE, 24 HOUR: Creatinine, 24H Ur: 0.88 g/(24.h) (ref 0.50–2.15)

## 2020-10-19 ENCOUNTER — Encounter: Payer: Self-pay | Admitting: Internal Medicine

## 2020-10-23 ENCOUNTER — Ambulatory Visit: Payer: Medicare Other | Admitting: Family

## 2020-10-25 ENCOUNTER — Inpatient Hospital Stay: Admit: 2020-10-25 | Payer: MEDICARE | Attending: Internal Medicine | Primary: Internal Medicine

## 2020-10-25 DIAGNOSIS — K5730 Diverticulosis of large intestine without perforation or abscess without bleeding: Secondary | ICD-10-CM

## 2020-10-25 LAB — AMB POC CREATININE
Creatinine, POC: 1.5 MG/DL — ABNORMAL HIGH (ref 0.6–1.3)
GFR African American: 41 mL/min/{1.73_m2} — ABNORMAL LOW (ref >60)
GFR Non-African American: 33 mL/min/{1.73_m2} — ABNORMAL LOW (ref >60)

## 2020-10-25 LAB — CREATININE, POC
Creatinine, POC: 1.5 MG/DL — ABNORMAL HIGH (ref 0.6–1.3)
GFRAA, POC: 41 mL/min/{1.73_m2} — ABNORMAL LOW (ref >60)
GFRNA, POC: 33 mL/min/{1.73_m2} — ABNORMAL LOW (ref >60)

## 2020-10-25 MED ORDER — IOPAMIDOL 61 % IV SOLN
300 mg iodine /mL (61 %) | Freq: Once | INTRAVENOUS | Status: AC
Start: 2020-10-25 — End: 2020-10-25
  Administered 2020-10-25: 15:00:00 via INTRAVENOUS

## 2020-10-25 MED FILL — ISOVUE-300  61 % INTRAVENOUS SOLUTION: 300 mg iodine /mL (61 %) | INTRAVENOUS | Qty: 100

## 2020-10-26 ENCOUNTER — Encounter: Payer: Self-pay | Admitting: Family

## 2020-10-26 ENCOUNTER — Other Ambulatory Visit: Payer: Self-pay

## 2020-10-26 ENCOUNTER — Ambulatory Visit (INDEPENDENT_AMBULATORY_CARE_PROVIDER_SITE_OTHER): Payer: Medicare Other | Admitting: Family

## 2020-10-26 VITALS — BP 154/84 | HR 75 | Temp 97.5°F | Ht 67.0 in | Wt 121.4 lb

## 2020-10-26 DIAGNOSIS — I10 Essential (primary) hypertension: Secondary | ICD-10-CM

## 2020-10-26 MED ORDER — AMLODIPINE BESYLATE 2.5 MG PO TABS: 2.5000 mg | ORAL_TABLET | Freq: Every day | ORAL | 0 refills | Status: DC

## 2020-10-26 NOTE — Progress Notes (Signed)
Mary Fitzpatrick is a 80 y.o. female with the following history as recorded in Epic[employer]:  Patient Active Problem List   Diagnosis Date Noted   Hypertension 07/20/2020   Hyperlipidemia 07/20/2020   Osteoporosis 07/20/2020   Tobacco abuse 07/20/2020    Current Outpatient Medications  Medication Sig Dispense Refill   atorvastatin (LIPITOR) 20 MG tablet Take 1 tablet (20 mg total) by mouth every morning. 90 tablet 3   amLODipine (NORVASC) 2.5 MG tablet Take 1 tablet (2.5 mg total) by mouth daily. 90 tablet 0   No current facility-administered medications for this visit.    Allergies: Hctz [hydrochlorothiazide]  No past medical history on file.  Past Surgical History:  Procedure Laterality Date   BREAST EXCISIONAL BIOPSY Left    BREAST EXCISIONAL BIOPSY Right     No family history on file.  Social History   Tobacco Use   Smoking status: Every Day    Types: Cigarettes   Smokeless tobacco: Never  Substance Use Topics   Alcohol use: Yes    Alcohol/week: 1.0 standard drink    Types: 1 Glasses of wine per week    Comment: most nights    Subjective:   3 month follow up on hypertension; since last OV, has seen endocrinology and HCTZ was stopped due to concerns for elevated calcium; Rx for Amlodipine 2.5 mg was given to use if blood pressure was above 140/90; per patient she has not been checking bp at home; notes pressure in office is always higher than what she sees at home; Denies any chest pain, shortness of breath, blurred vision or headache      Objective:  Vitals:   10/26/20 0917 10/26/20 0921  BP: (!) 170/90 (!) 154/84  Pulse: 75 75  Temp: (!) 97.5 F (36.4 C)   TempSrc: Oral   SpO2: 99%   Weight: 121 lb 6.4 oz (55.1 kg)   Height: 5\' 7"  (1.702 m)     General: Well developed, well nourished, in no acute distress  Skin : Warm and dry.  Head: Normocephalic and atraumatic  Eyes: Sclera and conjunctiva clear; pupils round and reactive to light; extraocular movements  intact  Ears: External normal; canals clear; tympanic membranes normal  Oropharynx: Pink, supple. No suspicious lesions  Neck: Supple without thyromegaly, adenopathy  Lungs: Respirations unlabored; clear to auscultation bilaterally without wheeze, rales, rhonchi  CVS exam: normal rate and regular rhythm.  Neurologic: Alert and oriented; speech intact; face symmetrical; moves all extremities well; CNII-XII intact without focal deficit   Assessment:  1. Essential hypertension   2. Hypercalcemia     Plan:  Rx for Amlodipine 2.5 mg daily; start checking blood pressure regularly and follow up in April 2022; will need OV sooner if pressure consistently above 140/ 90; Calcium level checked 2 weeks ago shows marked improvement since stopping HCTZ; keep planned follow up with endocrine for October to follow up on elevated PTH;  This visit occurred during the SARS-CoV-2 public health emergency.  Safety protocols were in place, including screening questions prior to the visit, additional usage of staff PPE, and extensive cleaning of exam room while observing appropriate contact time as indicated for disinfecting solutions.    No follow-ups on file.  No orders of the defined types were placed in this encounter.   Requested Prescriptions   Signed Prescriptions Disp Refills   amLODipine (NORVASC) 2.5 MG tablet 90 tablet 0    Sig: Take 1 tablet (2.5 mg total) by mouth daily.

## 2020-12-18 ENCOUNTER — Encounter: Payer: Self-pay | Admitting: Internal Medicine

## 2020-12-18 ENCOUNTER — Ambulatory Visit (INDEPENDENT_AMBULATORY_CARE_PROVIDER_SITE_OTHER): Payer: Medicare Other | Admitting: Internal Medicine

## 2020-12-18 ENCOUNTER — Other Ambulatory Visit: Payer: Self-pay

## 2020-12-18 VITALS — BP 140/92 | HR 90 | Ht 67.0 in | Wt 119.0 lb

## 2020-12-18 DIAGNOSIS — E875 Hyperkalemia: Secondary | ICD-10-CM | POA: Diagnosis not present

## 2020-12-18 DIAGNOSIS — E210 Primary hyperparathyroidism: Secondary | ICD-10-CM

## 2020-12-18 DIAGNOSIS — Z23 Encounter for immunization: Secondary | ICD-10-CM

## 2020-12-18 LAB — BASIC METABOLIC PANEL
BUN: 19 mg/dL (ref 6–23)
CO2: 27 mEq/L (ref 19–32)
Calcium: 11.3 mg/dL — ABNORMAL HIGH (ref 8.4–10.5)
Chloride: 102 mEq/L (ref 96–112)
Creatinine, Ser: 0.84 mg/dL (ref 0.40–1.20)
GFR: 65.94 mL/min (ref >60.00)
Glucose, Bld: 94 mg/dL (ref 70–99)
Potassium: 5.5 mEq/L — ABNORMAL HIGH (ref 3.5–5.1)
Sodium: 138 mEq/L (ref 135–145)

## 2020-12-18 LAB — VITAMIN D 25 HYDROXY (VIT D DEFICIENCY, FRACTURES): VITD: 61.19 ng/mL (ref 30.00–100.00)

## 2020-12-18 LAB — ALBUMIN: Albumin: 4.3 g/dL (ref 3.5–5.2)

## 2020-12-18 NOTE — Progress Notes (Signed)
Name: Mary Fitzpatrick  MRN/ DOB: [id number], 07-Feb-1941    Age/ Sex: 80 y.o., female     PCP: Olive Bass, FNP   Reason for Endocrinology Evaluation: Hypercalcemia     Initial Endocrinology Clinic Visit: 09/11/2020    PATIENT IDENTIFIER: Mary Fitzpatrick is a 80 y.o., female with a past medical history of Hypercalcemia and HTN.  She has followed with [facility] since 09/11/2020 for consultative assistance with management of her Hypercalcemia .   HISTORICAL SUMMARY: The patient was first diagnosed with hypercalcemia in 07/2020 with a serum calcium of 11.5 mg/dL ( uncorrected) and inappropriately normal PTH at 47 pg/mL   She has a hx of osteoporosis  and was on Fosamax for ~ 5 yrs  and stopped in 2020 but no prior fractures    On her initial visit to our clinic, we stopped HCTZ and started Amlodipine.   24 hr urinary excretion of calcium was 165 mg/dL (0/2585)   DXA 2/77/8242 with a T-score of -2.2 at the distal 33%  of radius.    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   Denies polyuria or polydipsia  Denies fall s or fracture Denies renal stones   Has been well hydrated  Avoids OTC calcium  Vitamin D 1000 iu daily     HISTORY:  Past Medical History: No past medical history on file. Past Surgical History:  Past Surgical History:  Procedure Laterality Date   BREAST EXCISIONAL BIOPSY Left    BREAST EXCISIONAL BIOPSY Right    Social History:  reports that she has been smoking cigarettes. She has never used smokeless tobacco. She reports current alcohol use of about 1.0 standard drink per week. No history on file for drug use. Family History: No family history on file.   HOME MEDICATIONS: Allergies as of 12/18/2020       Reactions   Hctz [hydrochlorothiazide] Other (See Comments)   Elevated calcium        Medication List        Accurate as of December 18, 2020  1:15 PM. If you have  any questions, ask your nurse or doctor.          amLODipine 2.5 MG tablet Commonly known as: NORVASC Take 1 tablet (2.5 mg total) by mouth daily.   atorvastatin 20 MG tablet Commonly known as: LIPITOR Take 1 tablet (20 mg total) by mouth every morning.          OBJECTIVE:   PHYSICAL EXAM: VS: BP (!) 140/92 (BP Location: Left Arm, Patient Position: Sitting, Cuff Size: Small)   Pulse 90   Ht 5\' 7"  (1.702 m)   Wt 119 lb (54 kg)   SpO2 97%   BMI 18.64 kg/m    EXAM: General: Pt appears well and is in NAD  Neck: General: Supple without adenopathy. Thyroid: Thyroid size normal.  No goiter or nodules appreciated. No thyroid bruit.  Lungs: Clear with good BS bilat with no rales, rhonchi, or wheezes  Heart: Auscultation: RRR.  Abdomen: Normoactive bowel sounds, soft, nontender, without masses or organomegaly palpable  Extremities:  BL LE: No pretibial edema normal ROM and strength.  Mental Status: Judgment, insight: Intact Orientation: Oriented to time, place, and person Mood and affect: No depression, anxiety, or agitation     DATA REVIEWED:  Results for Mary, Fitzpatrick (MRN Jonette Eva) as of 12/20/2020 08:35  Ref. Range 12/18/2020 11:50  Sodium Latest Ref Range: 135 - 145  mEq/L 138  Potassium Latest Ref Range: 3.5 - 5.1 mEq/L 5.5 (H)  Chloride Latest Ref Range: 96 - 112 mEq/L 102  CO2 Latest Ref Range: 19 - 32 mEq/L 27  Glucose Latest Ref Range: 70 - 99 mg/dL 94  BUN Latest Ref Range: 6 - 23 mg/dL 19  Creatinine Latest Ref Range: 0.40 - 1.20 mg/dL 4.12  Calcium Latest Ref Range: 8.4 - 10.5 mg/dL 87.8 (H)  Calcium Ionized Latest Ref Range: 4.8 - 5.6 mg/dL 6.76 (H)  Albumin Latest Ref Range: 3.5 - 5.2 g/dL 4.3  GFR Latest Ref Range: >60.00 mL/min 65.94  VITD Latest Ref Range: 30.00 - 100.00 ng/mL 61.19  PTH, Intact Latest Ref Range: 16 - 77 pg/mL 57     ASSESSMENT / PLAN / RECOMMENDATIONS:   Primary Hyperparathyroidism:   - Pt is asymptomatic  - Serum  calcium has been fluctuating, PTH and GFR normal -No indication for surgical intervention at this time, but this was discussed with the patient she was also offered medical treatment if necessary in the future    Recommendations:  - Encouraged hydration  - AVOID CALCIUM SUPPLEMENTS, AVOID LOW CALCIUM DIET - Maintain normal dietary calcium intake (2-3 servings of dairy a day) - Continue Vitamin D 1000 iu daily   2. Hyperkalemia:   -Slight elevation in potassium, unknown cause -We will repeat in 2 weeks, in the meantime the patient was advised to follow-up a low potassium diet  Follow-up in 6 months   Signed electronically by: Lyndle Herrlich, MD  [facility] Endocrinology  [facility] [address] [place] [place], [place] [address] Phone: [phone] FAX: [phone]      CC: Olive Bass, FNP [address] [place] [address] Phone: [phone]  Fax: [phone]   Return to Endocrinology clinic as below: Future Appointments  Date Time Provider Department Center  06/18/2021 10:50 AM Mathius Birkeland, Konrad Dolores, MD [facility]  06/27/2021 10:20 AM Olive Bass, FNP [facility]

## 2020-12-18 NOTE — Patient Instructions (Signed)
-   Stay Hydrated  - Avoid over the counter calcium tablets  - Maintain 2-3 servings of calcium in your diet ( cheese/ yogurt etc)or green leafy vegetables  - Continue Vitamin D 1000 iu daily

## 2020-12-19 LAB — CALCIUM, IONIZED: Calcium, Ion: 6.17 mg/dL — ABNORMAL HIGH (ref 4.8–5.6)

## 2020-12-19 LAB — PARATHYROID HORMONE, INTACT (NO CA): PTH: 57 pg/mL (ref 16–77)

## 2020-12-20 ENCOUNTER — Telehealth: Payer: Self-pay | Admitting: Internal Medicine

## 2020-12-20 DIAGNOSIS — Z23 Encounter for immunization: Secondary | ICD-10-CM | POA: Insufficient documentation

## 2020-12-20 DIAGNOSIS — E875 Hyperkalemia: Secondary | ICD-10-CM | POA: Insufficient documentation

## 2020-12-20 DIAGNOSIS — E210 Primary hyperparathyroidism: Secondary | ICD-10-CM | POA: Insufficient documentation

## 2020-12-20 NOTE — Telephone Encounter (Signed)
Can you please contact the pt and schedule her for a lab test in 2 weeks   A portal message was sent to her about abnormal labs    Thanks

## 2020-12-20 NOTE — Telephone Encounter (Signed)
Done! 10/18

## 2021-01-01 ENCOUNTER — Other Ambulatory Visit: Payer: Self-pay

## 2021-01-01 ENCOUNTER — Other Ambulatory Visit (INDEPENDENT_AMBULATORY_CARE_PROVIDER_SITE_OTHER): Payer: Medicare Other

## 2021-01-01 DIAGNOSIS — E875 Hyperkalemia: Secondary | ICD-10-CM

## 2021-01-01 LAB — POTASSIUM: Potassium: 4.9 mEq/L (ref 3.5–5.1)

## 2021-01-10 ENCOUNTER — Encounter (HOSPITAL_COMMUNITY): Payer: Self-pay

## 2021-01-10 NOTE — Progress Notes (Signed)
Please enter orders for surgery 01-15-21

## 2021-01-10 NOTE — Patient Instructions (Addendum)
DUE TO COVID-19 ONLY ONE VISITOR IS ALLOWED TO COME WITH YOU AND STAY IN THE WAITING ROOM ONLY DURING PRE OP AND PROCEDURE.   **NO VISITORS ARE ALLOWED IN THE SHORT STAY AREA OR RECOVERY ROOM!!**  IF YOU WILL BE ADMITTED INTO THE HOSPITAL YOU ARE ALLOWED ONLY TWO SUPPORT PEOPLE DURING VISITATION HOURS ONLY (7 AM -8PM)    Up to two visitors ages 53+ are allowed at one time in a patient's room.  The visitors may rotate out with other people throughout the day.  Additionally, up to two children between the ages of 10 and 75 are allowed and do not count toward the number of allowed visitors.  Children within this age range must be accompanied by an adult visitor.  One adult visitor may remain with the patient overnight and must be in the room by 8 PM.  COVID SWAB TESTING MUST BE COMPLETED ON:  The same day of your surgery  so arrive at 1:00 pm     Your procedure is scheduled on:  Tuesday, 01-15-21   Report to [facility] Main  Entrance     Report to admitting at 100 PM   Call this number if you have problems the morning of surgery [phone]   Do not eat food :After Midnight.   May have liquids until 1:00 PM day of surgery  CLEAR LIQUID DIET  Foods Allowed                                                                     Foods Excluded  Water, Black Coffee (no milk/no creamer) and tea, regular and decaf                              liquids that you cannot  Plain Jell-O in any flavor  (No red)                         see through such as: Fruit ices (not with fruit pulp)                                 milk, soups, orange juice  Iced Popsicles (No red)                                    All solid food                             Apple juices Sports drinks like Gatorade (No red) Lightly seasoned clear broth or consume(fat free) Sugar     Complete one Ensure drink the morning of surgery at       the day of surgery.       The day of surgery:  Drink ONE (1) Pre-Surgery  Clear Ensure or G2 by am the morning of surgery. Drink in one sitting. Do not sip.  This drink was given to you during your hospital  pre-op appointment visit. Nothing else to drink after completing the  Pre-Surgery Clear Ensure or G2.          If you have questions, please contact your surgeon's office.     Oral Hygiene is also important to reduce your risk of infection.                                    Remember - BRUSH YOUR TEETH THE MORNING OF SURGERY WITH YOUR REGULAR TOOTHPASTE   Do NOT smoke after Midnight  Take these medicines the morning of surgery with A SIP OF WATER:  Acetaminophen, Amlodipine, Atorvastatin.  Oxycodone if needed   Stop all vitamins and herbal supplements a week before surgery             You may not have any metal on your body including hair pins, jewelry, and body piercing             Do not wear make-up, lotions, powders, perfumes or deodorant  Do not wear nail polish including gel and S&S, artificial/acrylic nails, or any other type of covering on natural nails including finger and toenails. If you have artificial nails, gel coating, etc. that needs to be removed by a nail salon please have this removed prior to surgery or surgery may need to be canceled/ delayed if the surgeon/ anesthesia feels like they are unable to be safely monitored.   Do not shave  48 hours prior to surgery.   Do not bring valuables to the hospital. [place] [facility] IS NOT RESPONSIBLE FOR VALUABLES.   Contacts, dentures or bridgework may not be worn into surgery.   Bring small overnight bag day of surgery.   Special Instructions: Bring a copy of your healthcare power of attorney and living will documents the day of surgery if you haven't scanned them in before.  Please read over the following fact sheets you were given: IF YOU HAVE QUESTIONS ABOUT YOUR PRE OP INSTRUCTIONS PLEASE CALL [phone]   [facility] - Preparing for Surgery Before surgery, you can play an important  role.  Because skin is not sterile, your skin needs to be as free of germs as possible.  You can reduce the number of germs on your skin by washing with CHG (chlorahexidine gluconate) soap before surgery.  CHG is an antiseptic cleaner which kills germs and bonds with the skin to continue killing germs even after washing. Please DO NOT use if you have an allergy to CHG or antibacterial soaps.  If your skin becomes reddened/irritated stop using the CHG and inform your nurse when you arrive at Short Stay. Do not shave (including legs and underarms) for at least 48 hours prior to the first CHG shower.  You may shave your face/neck.  Please follow these instructions carefully:  1.  Shower with CHG Soap the night before surgery and the  morning of surgery.  2.  If you choose to wash your hair, wash your hair first as usual with your normal  shampoo.  3.  After you shampoo, rinse your hair and body thoroughly to remove the shampoo.                             4.  Use CHG as you would any other liquid soap.  You can apply chg directly to the skin and wash.  Gently with a scrungie or clean washcloth.  5.  Apply the  CHG Soap to your body ONLY FROM THE NECK DOWN.   Do   not use on face/ open                           Wound or open sores. Avoid contact with eyes, ears mouth and   genitals (private parts).                       Wash face,  Genitals (private parts) with your normal soap.             6.  Wash thoroughly, paying special attention to the area where your    surgery  will be performed.  7.  Thoroughly rinse your body with warm water from the neck down.  8.  DO NOT shower/wash with your normal soap after using and rinsing off the CHG Soap.                9.  Pat yourself dry with a clean towel.            10.  Wear clean pajamas.            11.  Place clean sheets on your bed the night of your first shower and do not  sleep with pets. Day of Surgery : Do not apply any lotions/deodorants the morning  of surgery.  Please wear clean clothes to the hospital/surgery center.  FAILURE TO FOLLOW THESE INSTRUCTIONS MAY RESULT IN THE CANCELLATION OF YOUR SURGERY  PATIENT SIGNATURE_________________________________  NURSE SIGNATURE__________________________________  ________________________________________________________________________    Mary Fitzpatrick  An incentive spirometer is a tool that can help keep your lungs clear and active. This tool measures how well you are filling your lungs with each breath. Taking long deep breaths may help reverse or decrease the chance of developing breathing (pulmonary) problems (especially infection) following: A long period of time when you are unable to move or be active. BEFORE THE PROCEDURE  If the spirometer includes an indicator to show your best effort, your nurse or respiratory therapist will set it to a desired goal. If possible, sit up straight or lean slightly forward. Try not to slouch. Hold the incentive spirometer in an upright position. INSTRUCTIONS FOR USE  Sit on the edge of your bed if possible, or sit up as far as you can in bed or on a chair. Hold the incentive spirometer in an upright position. Breathe out normally. Place the mouthpiece in your mouth and seal your lips tightly around it. Breathe in slowly and as deeply as possible, raising the piston or the ball toward the top of the column. Hold your breath for 3-5 seconds or for as long as possible. Allow the piston or ball to fall to the bottom of the column. Remove the mouthpiece from your mouth and breathe out normally. Rest for a few seconds and repeat Steps 1 through 7 at least 10 times every 1-2 hours when you are awake. Take your time and take a few normal breaths between deep breaths. The spirometer may include an indicator to show your best effort. Use the indicator as a goal to work toward during each repetition. After each set of 10 deep breaths, practice coughing to  be sure your lungs are clear. If you have an incision (the cut made at the time of surgery), support your incision when coughing by placing a pillow or rolled up towels firmly against it. Once  you are able to get out of bed, walk around indoors and cough well. You may stop using the incentive spirometer when instructed by your caregiver.  RISKS AND COMPLICATIONS Take your time so you do not get dizzy or light-headed. If you are in pain, you may need to take or ask for pain medication before doing incentive spirometry. It is harder to take a deep breath if you are having pain. AFTER USE Rest and breathe slowly and easily. It can be helpful to keep track of a log of your progress. Your caregiver can provide you with a simple table to help with this. If you are using the spirometer at home, follow these instructions: [place] IF:  You are having difficultly using the spirometer. You have trouble using the spirometer as often as instructed. Your pain medication is not giving enough relief while using the spirometer. You develop fever of 100.5 F (38.1 C) or higher. SEEK IMMEDIATE MEDICAL CARE IF:  You cough up bloody sputum that had not been present before. You develop fever of 102 F (38.9 C) or greater. You develop worsening pain at or near the incision site. MAKE SURE YOU:  Understand these instructions. Will watch your condition. Will get help right away if you are not doing well or get worse. Document Released: 07/14/2006 Document Revised: 05/26/2011 Document Reviewed: 09/14/2006 [facility] Patient Information 2014 [place], [place].   ________________________________________________________________________

## 2021-01-14 ENCOUNTER — Encounter (HOSPITAL_COMMUNITY): Payer: Self-pay

## 2021-01-14 ENCOUNTER — Other Ambulatory Visit: Payer: Self-pay

## 2021-01-14 ENCOUNTER — Encounter (HOSPITAL_COMMUNITY)
Admission: RE | Admit: 2021-01-14 | Discharge: 2021-01-14 | Disposition: A | Discharge status: Home / Self Care | Payer: Medicare Other | Source: Ambulatory Visit | Attending: Orthopedic Surgery | Admitting: Orthopedic Surgery

## 2021-01-14 DIAGNOSIS — Z01810 Encounter for preprocedural cardiovascular examination: Secondary | ICD-10-CM | POA: Diagnosis not present

## 2021-01-14 HISTORY — DX: Unspecified osteoarthritis, unspecified site: M19.90

## 2021-01-14 HISTORY — DX: Hyperlipidemia, unspecified: E78.5

## 2021-01-14 HISTORY — DX: Disorder of parathyroid gland, unspecified: E21.5

## 2021-01-14 HISTORY — DX: Essential (primary) hypertension: I10

## 2021-01-14 NOTE — Progress Notes (Addendum)
PCP - Ria Clock ,NP LOV 10-26-20 epic Cardiologist - no  PPM/ICD -  Device Orders -  Rep Notified -   Chest x-ray -  EKG -  Stress Test -  ECHO -  Cardiac Cath -  CBC/Diff/ CMP 01-09-21 epic  Sleep Study -  CPAP -   Fasting Blood Sugar -  Checks Blood Sugar _____ times a day  Blood Thinner Instructions: Aspirin Instructions:  ERAS Protcol - PRE-SURGERY Ensure or G2-   COVID TEST- Same day as surgery COVID vaccine -yes Moderna at least 2 vaccines  Activity--Able to Walk a flight of stairs without SOB Anesthesia review: HTN  Patient denies shortness of breath, fever, cough and chest pain at PAT appointment   All instructions explained to the patient, with a verbal understanding of the material. Patient agrees to go over the instructions while at home for a better understanding. Patient also instructed to self quarantine after being tested for COVID-19. The opportunity to ask questions was provided.

## 2021-01-15 ENCOUNTER — Ambulatory Visit (HOSPITAL_COMMUNITY)
Admission: RE | Admit: 2021-01-15 | Discharge: 2021-01-16 | Disposition: A | Discharge status: Home / Self Care | Payer: Medicare Other | Source: Ambulatory Visit | Attending: Orthopedic Surgery | Admitting: Orthopedic Surgery

## 2021-01-15 ENCOUNTER — Ambulatory Visit (HOSPITAL_COMMUNITY): Payer: Medicare Other

## 2021-01-15 ENCOUNTER — Encounter (HOSPITAL_COMMUNITY): Payer: Self-pay | Admitting: Orthopedic Surgery

## 2021-01-15 ENCOUNTER — Ambulatory Visit (HOSPITAL_COMMUNITY): Payer: Medicare Other | Admitting: Certified Registered Nurse Anesthetist

## 2021-01-15 ENCOUNTER — Encounter (HOSPITAL_COMMUNITY)
Admission: RE | Disposition: A | Discharge status: Home / Self Care | Payer: Self-pay | Source: Ambulatory Visit | Attending: Orthopedic Surgery

## 2021-01-15 DIAGNOSIS — S42202A Unspecified fracture of upper end of left humerus, initial encounter for closed fracture: Secondary | ICD-10-CM | POA: Insufficient documentation

## 2021-01-15 DIAGNOSIS — Z791 Long term (current) use of non-steroidal anti-inflammatories (NSAID): Secondary | ICD-10-CM | POA: Diagnosis not present

## 2021-01-15 DIAGNOSIS — M6281 Muscle weakness (generalized): Secondary | ICD-10-CM | POA: Diagnosis not present

## 2021-01-15 DIAGNOSIS — Z79899 Other long term (current) drug therapy: Secondary | ICD-10-CM | POA: Insufficient documentation

## 2021-01-15 DIAGNOSIS — Z419 Encounter for procedure for purposes other than remedying health state, unspecified: Secondary | ICD-10-CM

## 2021-01-15 DIAGNOSIS — W1830XA Fall on same level, unspecified, initial encounter: Secondary | ICD-10-CM | POA: Insufficient documentation

## 2021-01-15 DIAGNOSIS — Z8781 Personal history of (healed) traumatic fracture: Secondary | ICD-10-CM

## 2021-01-15 DIAGNOSIS — Z9889 Other specified postprocedural states: Secondary | ICD-10-CM

## 2021-01-15 DIAGNOSIS — F17210 Nicotine dependence, cigarettes, uncomplicated: Secondary | ICD-10-CM | POA: Insufficient documentation

## 2021-01-15 DIAGNOSIS — Z20822 Contact with and (suspected) exposure to covid-19: Secondary | ICD-10-CM | POA: Insufficient documentation

## 2021-01-15 DIAGNOSIS — Z01818 Encounter for other preprocedural examination: Secondary | ICD-10-CM

## 2021-01-15 HISTORY — PX: ORIF HUMERUS FRACTURE: SHX2126

## 2021-01-15 LAB — SARS CORONAVIRUS 2 BY RT PCR (HOSPITAL ORDER, PERFORMED IN ~~LOC~~ HOSPITAL LAB): SARS Coronavirus 2: NEGATIVE

## 2021-01-15 SURGERY — OPEN REDUCTION INTERNAL FIXATION (ORIF) PROXIMAL HUMERUS FRACTURE
Anesthesia: General | Laterality: Left

## 2021-01-15 MED ORDER — ROCURONIUM BROMIDE 10 MG/ML (PF) SYRINGE
PREFILLED_SYRINGE | INTRAVENOUS | Status: AC
  Filled 2021-01-15: qty 10

## 2021-01-15 MED ORDER — PHENYLEPHRINE 40 MCG/ML (10ML) SYRINGE FOR IV PUSH (FOR BLOOD PRESSURE SUPPORT)
PREFILLED_SYRINGE | INTRAVENOUS | Status: DC | PRN
  Administered 2021-01-15: 80 ug via INTRAVENOUS
  Administered 2021-01-15: 120 ug via INTRAVENOUS

## 2021-01-15 MED ORDER — 0.9 % SODIUM CHLORIDE (POUR BTL) OPTIME
TOPICAL | Status: DC | PRN
  Administered 2021-01-15: 1000 mL

## 2021-01-15 MED ORDER — CYCLOBENZAPRINE HCL 10 MG PO TABS: 10.0000 mg | ORAL_TABLET | Freq: Three times a day (TID) | ORAL | Status: DC | PRN

## 2021-01-15 MED ORDER — DIPHENHYDRAMINE HCL 12.5 MG/5ML PO ELIX: 12.5000 mg | ORAL_SOLUTION | ORAL | Status: DC | PRN

## 2021-01-15 MED ORDER — DEXAMETHASONE SODIUM PHOSPHATE 10 MG/ML IJ SOLN
INTRAMUSCULAR | Status: AC
  Filled 2021-01-15: qty 1

## 2021-01-15 MED ORDER — PHENYLEPHRINE HCL-NACL 20-0.9 MG/250ML-% IV SOLN
INTRAVENOUS | Status: DC | PRN
  Administered 2021-01-15: 60 ug/min via INTRAVENOUS

## 2021-01-15 MED ORDER — ONDANSETRON HCL 4 MG/2ML IJ SOLN
INTRAMUSCULAR | Status: DC | PRN
  Administered 2021-01-15: 4 mg via INTRAVENOUS

## 2021-01-15 MED ORDER — FENTANYL CITRATE PF 50 MCG/ML IJ SOSY
50.0000 ug | PREFILLED_SYRINGE | INTRAMUSCULAR | Status: DC
  Administered 2021-01-15: 100 ug via INTRAVENOUS
  Filled 2021-01-15: qty 2

## 2021-01-15 MED ORDER — POLYETHYLENE GLYCOL 3350 17 G PO PACK: 17.0000 g | PACK | Freq: Every day | ORAL | Status: DC | PRN

## 2021-01-15 MED ORDER — CEFAZOLIN SODIUM-DEXTROSE 2-4 GM/100ML-% IV SOLN
2.0000 g | INTRAVENOUS | Status: AC
  Administered 2021-01-15: 2 g via INTRAVENOUS
  Filled 2021-01-15: qty 100

## 2021-01-15 MED ORDER — ONDANSETRON HCL 4 MG/2ML IJ SOLN: 4.0000 mg | Freq: Four times a day (QID) | INTRAMUSCULAR | Status: DC | PRN

## 2021-01-15 MED ORDER — PANTOPRAZOLE SODIUM 40 MG PO TBEC
40.0000 mg | DELAYED_RELEASE_TABLET | Freq: Every day | ORAL | Status: DC
  Administered 2021-01-15 – 2021-01-16 (×2): 40 mg via ORAL
  Filled 2021-01-15 (×2): qty 1

## 2021-01-15 MED ORDER — ACETAMINOPHEN 325 MG PO TABS
325.0000 mg | ORAL_TABLET | Freq: Four times a day (QID) | ORAL | Status: DC | PRN
  Administered 2021-01-16: 650 mg via ORAL
  Filled 2021-01-15: qty 2

## 2021-01-15 MED ORDER — PROPOFOL 10 MG/ML IV BOLUS
INTRAVENOUS | Status: DC | PRN
  Administered 2021-01-15: 100 mg via INTRAVENOUS

## 2021-01-15 MED ORDER — TRANEXAMIC ACID-NACL 1000-0.7 MG/100ML-% IV SOLN
1000.0000 mg | INTRAVENOUS | Status: AC
  Administered 2021-01-15: 1000 mg via INTRAVENOUS
  Filled 2021-01-15: qty 100

## 2021-01-15 MED ORDER — CHLORHEXIDINE GLUCONATE 0.12 % MT SOLN: 15.0000 mL | Freq: Once | OROMUCOSAL | Status: AC

## 2021-01-15 MED ORDER — ALUM & MAG HYDROXIDE-SIMETH 200-200-20 MG/5ML PO SUSP: 30.0000 mL | ORAL | Status: DC | PRN

## 2021-01-15 MED ORDER — MENTHOL 3 MG MT LOZG: 1.0000 | LOZENGE | OROMUCOSAL | Status: DC | PRN

## 2021-01-15 MED ORDER — ATORVASTATIN CALCIUM 20 MG PO TABS
20.0000 mg | ORAL_TABLET | Freq: Every morning | ORAL | Status: DC
  Administered 2021-01-16: 20 mg via ORAL
  Filled 2021-01-15 (×2): qty 1

## 2021-01-15 MED ORDER — SUGAMMADEX SODIUM 200 MG/2ML IV SOLN
INTRAVENOUS | Status: DC | PRN
  Administered 2021-01-15: 200 mg via INTRAVENOUS

## 2021-01-15 MED ORDER — ROCURONIUM BROMIDE 10 MG/ML (PF) SYRINGE
PREFILLED_SYRINGE | INTRAVENOUS | Status: DC | PRN
  Administered 2021-01-15: 50 mg via INTRAVENOUS
  Administered 2021-01-15: 20 mg via INTRAVENOUS

## 2021-01-15 MED ORDER — ONDANSETRON HCL 4 MG/2ML IJ SOLN
INTRAMUSCULAR | Status: AC
  Filled 2021-01-15: qty 2

## 2021-01-15 MED ORDER — DEXAMETHASONE SODIUM PHOSPHATE 10 MG/ML IJ SOLN
INTRAMUSCULAR | Status: DC | PRN
  Administered 2021-01-15: 10 mg via INTRAVENOUS

## 2021-01-15 MED ORDER — DOCUSATE SODIUM 100 MG PO CAPS
100.0000 mg | ORAL_CAPSULE | Freq: Two times a day (BID) | ORAL | Status: DC
  Administered 2021-01-15 – 2021-01-16 (×2): 100 mg via ORAL
  Filled 2021-01-15 (×2): qty 1

## 2021-01-15 MED ORDER — PROPOFOL 10 MG/ML IV BOLUS
INTRAVENOUS | Status: AC
  Filled 2021-01-15: qty 20

## 2021-01-15 MED ORDER — BUPIVACAINE-EPINEPHRINE (PF) 0.5% -1:200000 IJ SOLN
INTRAMUSCULAR | Status: DC | PRN
  Administered 2021-01-15: 15 mL via PERINEURAL

## 2021-01-15 MED ORDER — HYDROMORPHONE HCL 1 MG/ML IJ SOLN: 0.5000 mg | INTRAMUSCULAR | Status: DC | PRN

## 2021-01-15 MED ORDER — METOCLOPRAMIDE HCL 5 MG PO TABS: 5.0000 mg | ORAL_TABLET | Freq: Three times a day (TID) | ORAL | Status: DC | PRN

## 2021-01-15 MED ORDER — LACTATED RINGERS IV SOLN: INTRAVENOUS | Status: DC

## 2021-01-15 MED ORDER — PHENYLEPHRINE 40 MCG/ML (10ML) SYRINGE FOR IV PUSH (FOR BLOOD PRESSURE SUPPORT)
PREFILLED_SYRINGE | INTRAVENOUS | Status: AC
  Filled 2021-01-15: qty 10

## 2021-01-15 MED ORDER — OXYCODONE HCL 5 MG PO TABS
5.0000 mg | ORAL_TABLET | ORAL | Status: DC | PRN
  Administered 2021-01-15 – 2021-01-16 (×2): 5 mg via ORAL
  Filled 2021-01-15 (×2): qty 1

## 2021-01-15 MED ORDER — ONDANSETRON HCL 4 MG PO TABS: 4.0000 mg | ORAL_TABLET | Freq: Four times a day (QID) | ORAL | Status: DC | PRN

## 2021-01-15 MED ORDER — FENTANYL CITRATE (PF) 100 MCG/2ML IJ SOLN
INTRAMUSCULAR | Status: AC
  Filled 2021-01-15: qty 2

## 2021-01-15 MED ORDER — ORAL CARE MOUTH RINSE
15.0000 mL | Freq: Once | OROMUCOSAL | Status: AC
  Administered 2021-01-15: 15 mL via OROMUCOSAL

## 2021-01-15 MED ORDER — FENTANYL CITRATE (PF) 100 MCG/2ML IJ SOLN
INTRAMUSCULAR | Status: DC | PRN
Start: 2021-01-15 — End: 2021-01-15
  Administered 2021-01-15 (×2): 50 ug via INTRAVENOUS

## 2021-01-15 MED ORDER — BUPIVACAINE LIPOSOME 1.3 % IJ SUSP
INTRAMUSCULAR | Status: DC | PRN
  Administered 2021-01-15: 10 mL via PERINEURAL

## 2021-01-15 MED ORDER — METOCLOPRAMIDE HCL 5 MG/ML IJ SOLN: 5.0000 mg | Freq: Three times a day (TID) | INTRAMUSCULAR | Status: DC | PRN

## 2021-01-15 MED ORDER — LIDOCAINE HCL (PF) 2 % IJ SOLN
INTRAMUSCULAR | Status: AC
  Filled 2021-01-15: qty 5

## 2021-01-15 MED ORDER — BISACODYL 5 MG PO TBEC: 5.0000 mg | DELAYED_RELEASE_TABLET | Freq: Every day | ORAL | Status: DC | PRN

## 2021-01-15 MED ORDER — PHENYLEPHRINE HCL (PRESSORS) 10 MG/ML IV SOLN
INTRAVENOUS | Status: AC
  Filled 2021-01-15: qty 2

## 2021-01-15 MED ORDER — LIDOCAINE 2% (20 MG/ML) 5 ML SYRINGE
INTRAMUSCULAR | Status: DC | PRN
  Administered 2021-01-15: 40 mg via INTRAVENOUS

## 2021-01-15 MED ORDER — AMLODIPINE BESYLATE 5 MG PO TABS
2.5000 mg | ORAL_TABLET | Freq: Every day | ORAL | Status: DC
  Administered 2021-01-16: 2.5 mg via ORAL
  Filled 2021-01-15 (×2): qty 1

## 2021-01-15 MED ORDER — PHENOL 1.4 % MT LIQD: 1.0000 | OROMUCOSAL | Status: DC | PRN

## 2021-01-15 SURGICAL SUPPLY — 60 items
BAG COUNTER SPONGE SURGICOUNT (BAG) IMPLANT
BAG ZIPLOCK 12X15 (MISCELLANEOUS) ×2 IMPLANT
BIT DRILL 3.2 (BIT) ×1
BIT DRILL 3.2XCALB NS DISP (BIT) ×1 IMPLANT
BIT DRILL CALIBRATED 2.7 (BIT) ×2 IMPLANT
BIT DRL 3.2XCALB NS DISP (BIT) ×1
COOLER ICEMAN CLASSIC (MISCELLANEOUS) ×2 IMPLANT
COVER SURGICAL LIGHT HANDLE (MISCELLANEOUS) ×2 IMPLANT
DERMABOND ADVANCED (GAUZE/BANDAGES/DRESSINGS) ×1
DERMABOND ADVANCED .7 DNX12 (GAUZE/BANDAGES/DRESSINGS) ×1 IMPLANT
DRAPE C-ARM 42X120 X-RAY (DRAPES) ×2 IMPLANT
DRAPE C-ARMOR (DRAPES) IMPLANT
DRAPE ORTHO SPLIT 77X108 STRL (DRAPES) ×2
DRAPE SURG 17X11 SM STRL (DRAPES) ×2 IMPLANT
DRAPE SURG ORHT 6 SPLT 77X108 (DRAPES) ×2 IMPLANT
DRAPE U-SHAPE 47X51 STRL (DRAPES) ×2 IMPLANT
DRSG AQUACEL AG ADV 3.5X 6 (GAUZE/BANDAGES/DRESSINGS) ×2 IMPLANT
DRSG AQUACEL AG ADV 3.5X10 (GAUZE/BANDAGES/DRESSINGS) IMPLANT
DURAPREP 26ML APPLICATOR (WOUND CARE) ×2 IMPLANT
ELECT BLADE TIP CTD 4 INCH (ELECTRODE) ×2 IMPLANT
ELECT REM PT RETURN 15FT ADLT (MISCELLANEOUS) ×2 IMPLANT
GLOVE SURG ENC MOIS LTX SZ7.5 (GLOVE) ×2 IMPLANT
GLOVE SURG ENC MOIS LTX SZ8 (GLOVE) ×2 IMPLANT
GLOVE SURG MICRO LTX SZ7 (GLOVE) ×2 IMPLANT
GLOVE SURG MICRO LTX SZ7.5 (GLOVE) ×2 IMPLANT
GOWN STRL REUS W/TWL LRG LVL3 (GOWN DISPOSABLE) ×4 IMPLANT
K-WIRE 2X5 SS THRDED S3 (WIRE) ×2
KIT BASIN OR (CUSTOM PROCEDURE TRAY) ×2 IMPLANT
KIT TURNOVER KIT A (KITS) IMPLANT
KWIRE 2X5 SS THRDED S3 (WIRE) ×1 IMPLANT
MANIFOLD NEPTUNE II (INSTRUMENTS) ×2 IMPLANT
NEEDLE TAPERED W/ NITINOL LOOP (MISCELLANEOUS) IMPLANT
NS IRRIG 1000ML POUR BTL (IV SOLUTION) ×2 IMPLANT
PACK SHOULDER (CUSTOM PROCEDURE TRAY) ×2 IMPLANT
PAD ARMBOARD 7.5X6 YLW CONV (MISCELLANEOUS) ×4 IMPLANT
PAD COLD SHLDR WRAP-ON (PAD) ×2 IMPLANT
PEG LOCKING 3.2MMX46 (Peg) ×2 IMPLANT
PEG LOCKING 3.2X34 (Screw) ×2 IMPLANT
PEG LOCKING 3.2X36 (Screw) ×2 IMPLANT
PEG LOCKING 3.2X38 (Screw) ×6 IMPLANT
PEG LOCKING 3.2X42 (Screw) ×2 IMPLANT
PEG LOCKING 3.2X52 (Peg) ×4 IMPLANT
PLATE PROX HUMERUS HI LT 4H (Plate) ×2 IMPLANT
PROTECTOR NERVE ULNAR (MISCELLANEOUS) ×2 IMPLANT
PUTTY DBM STAGRAFT PLUS 5CC (Putty) ×2 IMPLANT
RESTRAINT HEAD UNIVERSAL NS (MISCELLANEOUS) ×2 IMPLANT
SCREW LP NL T15 3.5X26 (Screw) ×8 IMPLANT
SLING ARM FOAM STRAP LRG (SOFTGOODS) IMPLANT
SLING ARM FOAM STRAP MED (SOFTGOODS) IMPLANT
SLING ARM FOAM STRAP SML (SOFTGOODS) IMPLANT
SUCTION FRAZIER HANDLE 12FR (TUBING) ×1
SUCTION TUBE FRAZIER 12FR DISP (TUBING) ×1 IMPLANT
SUT FIBERWIRE #2 38 T-5 BLUE (SUTURE)
SUT MNCRL AB 3-0 PS2 18 (SUTURE) ×2 IMPLANT
SUT MON AB 2-0 CT1 36 (SUTURE) ×2 IMPLANT
SUT VIC AB 1 CT1 36 (SUTURE) ×2 IMPLANT
SUTURE FIBERWR #2 38 T-5 BLUE (SUTURE) IMPLANT
TOWEL OR 17X26 10 PK STRL BLUE (TOWEL DISPOSABLE) ×2 IMPLANT
TOWEL OR NON WOVEN STRL DISP B (DISPOSABLE) ×2 IMPLANT
YANKAUER SUCT BULB TIP 10FT TU (MISCELLANEOUS) ×2 IMPLANT

## 2021-01-15 NOTE — Anesthesia Postprocedure Evaluation (Signed)
Anesthesia Post Note  Patient: Faustina Gebert  Procedure(s) Performed: OPEN REDUCTION INTERNAL FIXATION (ORIF) PROXIMAL HUMERUS FRACTURE (Left)     Patient location during evaluation: PACU Anesthesia Type: General and Regional Level of consciousness: awake and alert Pain management: pain level controlled Vital Signs Assessment: post-procedure vital signs reviewed and stable Respiratory status: spontaneous breathing, nonlabored ventilation and respiratory function stable Cardiovascular status: blood pressure returned to baseline and stable Postop Assessment: no apparent nausea or vomiting Anesthetic complications: no   No notable events documented.  Last Vitals:  Vitals:   01/15/21 1816 01/15/21 1830  BP: 120/88 (!) 146/88  Pulse: 80 79  Resp: 19 16  Temp:    SpO2: 100% 94%    Last Pain:  Vitals:   01/15/21 1830  TempSrc:   PainSc: Asleep                 Kimoni Pagliarulo,W. EDMOND

## 2021-01-15 NOTE — Progress Notes (Signed)
Assisted Dr. Edmond Fitzgerald with right, ultrasound guided, interscalene  block. Side rails up, monitors on throughout procedure. See vital signs in flow sheet. Tolerated Procedure well. 

## 2021-01-15 NOTE — Op Note (Signed)
01/15/2021  5:53 PM  PATIENT:   Mary Fitzpatrick  80 y.o. female  PRE-OPERATIVE DIAGNOSIS: Comminuted, displaced left three-part proximal humerus  POST-OPERATIVE DIAGNOSIS: Same  PROCEDURE: Open reduction and internal fixation of displaced left 2 part proximal humerus fracture with the application of allograft bone graft to the fracture site  SURGEON:  Mozel Burdett, Vania Rea M.D.  ASSISTANTS: Ralene Bathe, PA-C  ANESTHESIA:   General endotracheal and interscalene block with Exparel  EBL: 150 cc  SPECIMEN: None  Drains: None   PATIENT DISPOSITION:  PACU - hemodynamically stable.    PLAN OF CARE: Admit for overnight observation  Brief history:  Mary Fitzpatrick is a 80 year old female who sustained a ground-level fall approximately 1 week ago injuring her left shoulder and evaluation at a local emergency room demonstrated a severely displaced 2 part proximal humerus fracture.  She was subsequently evaluated in our office where examination showed her to be neurovascular intact in left upper extremity.  Plain radiographs showed complete displacement of the humeral head off the humeral shaft.  Due to the degree of displacement and comminution about the fracture site she is brought to the operating this time for planned open reduction and internal fixation.  Preoperatively I counseled Mary Fitzpatrick regarding treatment options as well as the potential risks versus benefits thereof.  Possible surgical complications were reviewed including the potential for bleeding, infection, neurovascular injury, persistent pain, loss of motion, anesthetic complication, malunion, nonunion, loss of fixation, and possible need for additional surgery.  She understands, and accepts, and agrees with our planned procedure.  Procedure detail:  After undergoing routine preop evaluation the patient received prophylactic antibiotics and interscalene block with Exparel was established in the holding area by the anesthesia  department.  Subsequently placed supine on the operating table and underwent smooth induction of a general endotracheal anesthesia.  Placed into the beachchair position and appropriately padded and protected.  The left shoulder girdle region was sterilely prepped and draped in standard fashion.  Timeout was called.  A deltopectoral approach was made to the left shoulder through an approximately 10 cm incision.  Skin flaps were elevated dissection carried deeply and the deltopectoral interval was then developed from proximal to distal with the vein taken laterally.  The humeral shaft segment had been displaced medially due to its attachment to the pectoralis major and the long head bicep tendon had displaced into the fracture site.  The interposed soft tissues were carefully extracted from the fracture site as was the long head biceps tendon and an initial reduction was then performed.  There were several large comminuted fracture fragments in the metaphyseal region.  Once all the interposed soft tissue was removed we then obtained provisional alignment and utilized fluoroscopic imaging at this point to evaluate the fracture and the overall alignment.  We then selected initially a 3-hole high Biomet proximal humeral plate but given the degree of comminution over the metaphysis we opted for the 4-hole high plate.  At this point a provisional reduction was achieved and due to the instability the fracture pattern was difficult to achieve appropriate reduction of the entire construct and so we began by's threading a guidepin up into the center of the humeral head with proper positioning confirmed fluoroscopically and once this was achieved we then achieve provisional fixation to the shaft using a single shaft screw.  Once we are pleased with the overall alignment we then completed fixation with the pegs extending up into the humeral head and then the additional 3  screws into the shaft all obtaining good purchase.  Our  final fluoroscopic images showed good alignment at the fracture site and good position the hardware.  After final fluoroscopic images confirmed proper positioning we placed 5 cc of stay graft bone graft into the metaphyseal defect of the fracture.  Final irrigation was then completed.  Hemostasis was obtained.  The deltopectoral interval was reapproximated with a series of figure-of-eight #1 Vicryl sutures.  2-0 Monocryl used to the subcu layer and intracuticular 3-0 Monocryl for the skin followed by Dermabond and Aquacel dressing.  The left arm was then placed into a sling.  The patient was then awakened, extubated, and taken to the recovery room in stable condition.  Ralene Bathe, PA-C was utilized as an Geophysicist/field seismologist throughout this case, essential for help with positioning the patient, positioning extremity, tissue manipulation, implantation of the prosthesis, suture management, wound closure, and intraoperative decision-making.  Senaida Lange MD   Contact # [phone]

## 2021-01-15 NOTE — Anesthesia Preprocedure Evaluation (Addendum)
Anesthesia Evaluation  Patient identified by MRN, date of birth, ID band Patient awake    Reviewed: Allergy & Precautions, H&P , NPO status , Patient's Chart, lab work & pertinent test results  Airway Mallampati: II  TM Distance: >3 FB Neck ROM: Full    Dental no notable dental hx. (+) Teeth Intact, Dental Advisory Given   Pulmonary Current Smoker and Patient abstained from smoking.,    Pulmonary exam normal breath sounds clear to auscultation       Cardiovascular hypertension, Pt. on medications  Rhythm:Regular Rate:Normal     Neuro/Psych negative neurological ROS  negative psych ROS   GI/Hepatic negative GI ROS, Neg liver ROS,   Endo/Other  negative endocrine ROS  Renal/GU negative Renal ROS  negative genitourinary   Musculoskeletal  (+) Arthritis , Osteoarthritis,    Abdominal   Peds  Hematology negative hematology ROS (+)   Anesthesia Other Findings   Reproductive/Obstetrics negative OB ROS                            Anesthesia Physical Anesthesia Plan  ASA: 2  Anesthesia Plan: General   Post-op Pain Management:  Regional for Post-op pain   Induction: Intravenous  PONV Risk Score and Plan: 3 and Ondansetron, Dexamethasone and Treatment may vary due to age or medical condition  Airway Management Planned: Oral ETT  Additional Equipment:   Intra-op Plan:   Post-operative Plan: Extubation in OR  Informed Consent: I have reviewed the patients History and Physical, chart, labs and discussed the procedure including the risks, benefits and alternatives for the proposed anesthesia with the patient or authorized representative who has indicated his/her understanding and acceptance.     Dental advisory given  Plan Discussed with: CRNA  Anesthesia Plan Comments:         Anesthesia Quick Evaluation

## 2021-01-15 NOTE — Plan of Care (Signed)
  Problem: Education: Goal: Knowledge of General Education information will improve Description Including pain rating scale, medication(s)/side effects and non-pharmacologic comfort measures Outcome: Progressing   

## 2021-01-15 NOTE — Anesthesia Procedure Notes (Signed)
Anesthesia Regional Block: Interscalene brachial plexus block   Pre-Anesthetic Checklist: , timeout performed,  Correct Patient, Correct Site, Correct Laterality,  Correct Procedure, Correct Position, site marked,  Risks and benefits discussed,  Pre-op evaluation,  At surgeon's request and post-op pain management  Laterality: Left  Prep: Maximum Sterile Barrier Precautions used, chloraprep       Needles:  Injection technique: Single-shot  Needle Type: Echogenic Stimulator Needle     Needle Length: 5cm  Needle Gauge: 22     Additional Needles:   Procedures:, nerve stimulator,,, ultrasound used (permanent image in chart),,     Nerve Stimulator or Paresthesia:  Response: Biceps response  Additional Responses:   Narrative:  Start time: 01/15/2021 3:09 PM End time: 01/15/2021 3:19 PM Injection made incrementally with aspirations every 5 mL. Anesthesiologist: Gaynelle Adu, MD  Additional Notes: 2% Lidocaine skin wheel.

## 2021-01-15 NOTE — H&P (Signed)
Mary Fitzpatrick    Chief Complaint: displaced left proximal humerus HPI: The patient is a 80 y.o. female status post a ground-level fall sustaining a severely displaced left three-part proximal humerus fracture last week.  Due to the degree of displacement we have discussed with the patient the treatment options and the potential risks versus benefits of conservative versus surgical management.  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 No pertinent family history.  Social History:  reports that she has been smoking cigarettes. She has never used smokeless tobacco. She reports current alcohol use of about 2.0 standard drinks per week. She reports that she does not currently use drugs.   Medications Prior to Admission  Medication Sig Dispense Refill   acetaminophen (TYLENOL) 325 MG tablet Take 650 mg by mouth every 6 (six) hours as needed for moderate pain.     amLODipine (NORVASC) 2.5 MG tablet Take 1 tablet (2.5 mg total) by mouth daily. 90 tablet 0   atorvastatin (LIPITOR) 20 MG tablet Take 1 tablet (20 mg total) by mouth every morning. 90 tablet 3   cholecalciferol (VITAMIN D3) 25 MCG (1000 UNIT) tablet Take 1,000 Units by mouth daily.     cyclobenzaprine (FLEXERIL) 10 MG tablet Take 10 mg by mouth 3 (three) times daily as needed for muscle spasms.     docusate sodium (COLACE) 100 MG capsule Take 100 mg by mouth daily.     naproxen (NAPROSYN) 500 MG tablet Take 500 mg by mouth 2  (two) times daily.     oxyCODONE (OXY IR/ROXICODONE) 5 MG immediate release tablet Take 5 mg by mouth 4 (four) times daily as needed for pain.       Physical Exam: Inspection left shoulder reveals diffuse swelling with resolving ecchymosis extending into the upper arm.  The compartments are soft.  She does have a prominence anteriorly in the soft tissues consistent with a known displaced humeral shaft fragment embedded in the anterior soft tissue envelope.  She currently has a interscalene block in place but at her office visit last week she was neurovascular intact.  Plain radiographs demonstrate a severely displaced 2 part proximal humerus fracture  Vitals  Temp:  [98.1 F (36.7 C)] 98.1 F (36.7 C) (11/01 1332) Pulse Rate:  [73-104] 83 (11/01 1517) Resp:  [8-16] 15 (11/01 1517) BP: (137-156)/(78-114) 156/107 (11/01 1517) SpO2:  [83 %-100 %] 100 % (11/01 1517)  Assessment/Plan  Impression: displaced left proximal humerus  Plan of Action: Procedure(s): OPEN REDUCTION INTERNAL FIXATION (ORIF) PROXIMAL HUMERUS FRACTURE  Briceida Rasberry M Jatoya Armbrister 01/15/2021, 3:29 PM Contact # [phone]

## 2021-01-15 NOTE — Transfer of Care (Signed)
Immediate Anesthesia Transfer of Care Note  Patient: Mary Fitzpatrick  Procedure(s) Performed: OPEN REDUCTION INTERNAL FIXATION (ORIF) PROXIMAL HUMERUS FRACTURE (Left)  Patient Location: PACU  Anesthesia Type:GA combined with regional for post-op pain  Level of Consciousness: awake, alert , oriented and patient cooperative  Airway & Oxygen Therapy: Patient Spontanous Breathing and Patient connected to face mask oxygen  Post-op Assessment: Report given to RN and Post -op Vital signs reviewed and stable  Post vital signs: Reviewed and stable  Last Vitals:  Vitals Value Taken Time  BP 120/88 01/15/21 1816  Temp    Pulse 80 01/15/21 1818  Resp 18 01/15/21 1818  SpO2 100 % 01/15/21 1818  Vitals shown include unvalidated device data.  Last Pain:  Vitals:   01/15/21 1332  TempSrc: Oral         Complications: No notable events documented.

## 2021-01-15 NOTE — Anesthesia Procedure Notes (Signed)
Procedure Name: Intubation Date/Time: 01/15/2021 3:56 PM Performed by: Myna Bright, CRNA Pre-anesthesia Checklist: Patient identified, Emergency Drugs available, Suction available and Patient being monitored Patient Re-evaluated:Patient Re-evaluated prior to induction Oxygen Delivery Method: Circle system utilized Preoxygenation: Pre-oxygenation with 100% oxygen Induction Type: IV induction Ventilation: Mask ventilation without difficulty Laryngoscope Size: Mac and 3 Grade View: Grade I Tube type: Oral Tube size: 7.0 mm Number of attempts: 1 Airway Equipment and Method: Stylet Placement Confirmation: ETT inserted through vocal cords under direct vision, positive ETCO2 and breath sounds checked- equal and bilateral Secured at: 21 cm Tube secured with: Tape Dental Injury: Teeth and Oropharynx as per pre-operative assessment

## 2021-01-16 DIAGNOSIS — S42202A Unspecified fracture of upper end of left humerus, initial encounter for closed fracture: Secondary | ICD-10-CM | POA: Diagnosis not present

## 2021-01-16 MED ORDER — OXYCODONE HCL 5 MG PO TABS: 5.0000 mg | ORAL_TABLET | Freq: Four times a day (QID) | ORAL | 0 refills | Status: DC | PRN

## 2021-01-16 NOTE — TOC Transition Note (Signed)
Transition of Care [facility]) - CM/SW Discharge Note  Patient Details  Name: Teddy Pena MRN: [id number] Date of Birth: 1940/05/10  Transition of Care [facility]) CM/SW Contact:  Sherie Don, LCSW Phone Number: 01/16/2021, 10:00 AM  Clinical Narrative: Patient to discharge home today. CSW met with patient and her son to discuss discharge needs. Patient will need HHPT/OT/aide and does not have a preference for agency as long as she is able to get an aide to assist with bathing. Patient has a cane at home and does not have any other DME needs at this time. CSW made [facility] referral to [facility] with [employer]. Orders are in. TOC signing off.  Final next level of care: [place] Barriers to Discharge: No Barriers Identified  Patient Goals and CMS Choice Patient states their goals for this hospitalization and ongoing recovery are:: Discharge home with [facility] CMS Medicare.gov Compare Post Acute Care list provided to:: Patient Choice offered to / list presented to : Patient  Discharge Plan and Services       DME Arranged: N/A DME Agency: NA HH Arranged: PT, OT, Nurse's Aide [place] Agency: [place] [facility] Date HH Agency Contacted: 01/16/21 Time [place]: [phone] Representative spoke with at [place]: Ramond Marrow  Readmission Risk Interventions No flowsheet data found.

## 2021-01-16 NOTE — Discharge Summary (Signed)
PATIENT ID:      Mary Fitzpatrick  MRN:     [id number] DOB/AGE:    [birth year] / 80 y.o.     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  HOSPITAL COURSE:  Mary Fitzpatrick is a 80 y.o. admitted on 01/15/2021 with a diagnosis of displaced left proximal humerus.  They were brought to the operating room on 01/15/2021 and underwent Procedure(s): OPEN REDUCTION INTERNAL FIXATION (ORIF) PROXIMAL HUMERUS FRACTURE.    They were given perioperative antibiotics:  Anti-infectives (From admission, onward)    Start     Dose/Rate Route Frequency Ordered Stop   01/16/21 0600  ceFAZolin (ANCEF) IVPB 2g/100 mL premix        2 g 200 mL/hr over 30 Minutes Intravenous On call to O.R. 01/15/21 1307 01/15/21 1605     .  Patient underwent the above named procedure and tolerated it well. The following day they were hemodynamically stable and pain was controlled on oral analgesics. They were neurovascularly intact to the operative extremity. OT was ordered and worked with patient per protocol. They were medically and orthopaedically stable for discharge on todays date. Home health services were also ordered and a PT eval prior to DC.    DIAGNOSTIC STUDIES:  RECENT RADIOGRAPHIC STUDIES :  DG Shoulder Left  Result Date: 01/15/2021 CLINICAL DATA:  surgery; ORIF Left proximal humerus EXAM: DG C-ARM 1-60 MIN; LEFT SHOULDER - 2+ VIEW FLUOROSCOPY TIME:  Fluoroscopy Time:  48 seconds Number of Acquired Spot Images: 3 COMPARISON:   None. FINDINGS: Three C-arm fluoroscopic images were obtained intraoperatively and submitted for post operative interpretation. These images demonstrate plate screw fixation of a proximal humerus fracture. Medially displaced fracture fragment. Please see the performing provider's procedural report for further detail. IMPRESSION: Intraoperative fluoroscopy, as detailed above. Electronically Signed   By: Feliberto Harts M.D.   On: 01/15/2021 18:06   DG C-Arm 1-60 Min  Result Date: 01/15/2021 CLINICAL DATA:  surgery; ORIF Left proximal humerus EXAM: DG C-ARM 1-60 MIN; LEFT SHOULDER - 2+ VIEW FLUOROSCOPY TIME:  Fluoroscopy Time:  48 seconds Number of Acquired Spot Images: 3 COMPARISON:  None. FINDINGS: Three C-arm fluoroscopic images were obtained intraoperatively and submitted for post operative interpretation. These images demonstrate plate screw fixation of a proximal humerus fracture. Medially displaced fracture fragment. Please see the performing provider's procedural report for further detail. IMPRESSION: Intraoperative fluoroscopy, as detailed above. Electronically Signed   By: Feliberto Harts M.D.   On: 01/15/2021 18:06   DG C-Arm 1-60 Min  Result Date: 01/15/2021 CLINICAL DATA:  surgery; ORIF Left proximal humerus EXAM: DG C-ARM 1-60 MIN; LEFT SHOULDER - 2+ VIEW FLUOROSCOPY TIME:  Fluoroscopy Time:  48 seconds Number of Acquired Spot Images: 3 COMPARISON:  None. FINDINGS: Three C-arm fluoroscopic images were obtained intraoperatively and submitted for post operative interpretation. These images demonstrate plate screw fixation of a proximal humerus fracture. Medially displaced fracture fragment. Please see the performing provider's procedural report for further detail. IMPRESSION: Intraoperative fluoroscopy,  as detailed above. Electronically Signed   By: Feliberto Harts M.D.   On: 01/15/2021 18:06    RECENT VITAL SIGNS:  Patient Vitals for the past 24 hrs:  BP Temp Temp src Pulse Resp SpO2  Height Weight  01/16/21 0553 116/74 97.8 F (36.6 C) Oral 77 18 99 % -- --  01/16/21 0233 116/76 97.7 F (36.5 C) Oral 75 18 98 % -- --  01/15/21 2241 132/83 97.7 F (36.5 C) Oral 75 18 100 % -- --  01/15/21 2041 139/81 97.7 F (36.5 C) Oral 77 20 98 % 5\' 7"  (1.702 m) 69.1 kg  01/15/21 2015 138/79 97.7 F (36.5 C) -- 78 13 98 % -- --  01/15/21 2000 140/82 -- -- 75 (!) 22 97 % -- --  01/15/21 1945 137/83 -- -- 81 (!) 22 100 % -- --  01/15/21 1930 (!) 149/83 -- -- 74 18 100 % -- --  01/15/21 1915 134/81 -- -- 76 20 98 % -- --  01/15/21 1900 (!) 143/85 -- -- 83 17 95 % -- --  01/15/21 1845 (!) 149/83 -- -- 80 (!) 21 92 % -- --  01/15/21 1830 (!) 146/88 -- -- 79 16 94 % -- --  01/15/21 1816 120/88 -- -- 80 19 100 % -- --  01/15/21 1517 (!) 156/107 -- -- 83 15 100 % -- --  01/15/21 1512 (!) 152/114 -- -- (!) 104 14 (!) 83 % -- --  01/15/21 1507 (!) 148/78 -- -- 73 10 100 % -- --  01/15/21 1502 -- -- -- 78 15 100 % -- --  01/15/21 1337 -- -- -- 78 (!) 8 100 % -- --  01/15/21 1332 137/79 98.1 F (36.7 C) Oral 80 16 97 % -- --  .  RECENT EKG RESULTS:    Orders placed or performed during the hospital encounter of 01/15/21   EKG 12-Lead   EKG 12-Lead   EKG 12-Lead   EKG 12-Lead    DISCHARGE INSTRUCTIONS:    DISCHARGE MEDICATIONS:   Allergies as of 01/16/2021       Reactions   Hctz [hydrochlorothiazide] Other (See Comments)   Elevated calcium        Medication List     TAKE these medications    acetaminophen 325 MG tablet Commonly known as: TYLENOL Take 650 mg by mouth every 6 (six) hours as needed for moderate pain.   amLODipine 2.5 MG tablet Commonly known as: NORVASC Take 1 tablet (2.5 mg total) by mouth daily.   atorvastatin 20 MG tablet Commonly known as: LIPITOR Take 1 tablet (20 mg total) by mouth every morning.   cholecalciferol 25 MCG (1000 UNIT) tablet Commonly known as: VITAMIN D3 Take 1,000 Units by mouth daily.   cyclobenzaprine 10 MG  tablet Commonly known as: FLEXERIL Take 10 mg by mouth 3 (three) times daily as needed for muscle spasms.   docusate sodium 100 MG capsule Commonly known as: COLACE Take 100 mg by mouth daily.   naproxen 500 MG tablet Commonly known as: NAPROSYN Take 500 mg by mouth 2 (two) times daily.   oxyCODONE 5 MG immediate release tablet Commonly known as: Oxy IR/ROXICODONE Take 1 tablet (5 mg total) by mouth 4 (four) times daily as needed. What changed: reasons to take this        FOLLOW UP VISIT:    Follow-up Information     13/04/2020, MD Follow up.   Specialty: Orthopedic Surgery Why: we will call  you with your follow up appt Contact information: [address] [place] [address] [phone]                 DISCHARGE JJ:[id number]   DISCHARGE CONDITION:  Mary Fitzpatrick for Dr. Francena Hanly 01/16/2021, 7:59 AM

## 2021-01-16 NOTE — Evaluation (Signed)
Physical Therapy Evaluation Patient Details Name: Mary Fitzpatrick MRN: [id number] DOB: 08/02/1940 Today's Date: 01/16/2021  History of Present Illness  80 y.o. female s/p fall last week with proximal humerus fx. Now s/p L ORIF 11/1. PMH of arthritis, HLD, HTN, and parathyroid abnormality  Clinical Impression  Pt ambulated 180' with SPC with mild loss of balance x 2, pt was able to self correct. Pt reports feeling "wobbly", which is not her baseline. She denies other falls prior to this one in the past 6 months. PT is recommending use of a cane when ambulating and supervision for mobility until she feels more steady on her feet. At baseline she ambulates without an assistive device.         Recommendations for follow up therapy are one component of a multi-disciplinary discharge planning process, led by the attending physician.  Recommendations may be updated based on patient status, additional functional criteria and insurance authorization.  Follow Up Recommendations Home health PT    Assistance Recommended at Discharge Intermittent Supervision/Assistance  Functional Status Assessment Patient has had a recent decline in their functional status and demonstrates the ability to make significant improvements in function in a reasonable and predictable amount of time.  Equipment Recommendations  None recommended by PT    Recommendations for Other Services       Precautions / Restrictions Precautions Precautions: Shoulder Type of Shoulder Precautions: Sling at all times except ADL/exercise Yes  Non weight bearing Yes  AROM elbow, wrist and hand to tolerance Yes  PROM of shoulder No  AROM of shoulder No Shoulder Interventions: Shoulder sling/immobilizer;Off for dressing/bathing/exercises Precaution Booklet Issued: Yes (comment) Required Braces or Orthoses: Sling Restrictions Weight Bearing Restrictions: Yes LUE Weight Bearing: Non weight bearing      Mobility  Bed Mobility Overal bed  mobility: Modified Independent             General bed mobility comments: increased time, HOB elevated, used rail for sit to supine    Transfers Overall transfer level: Needs assistance Equipment used: None Transfers: Sit to/from Stand Sit to Stand: Min guard           General transfer comment: min guard for initial sit to stand for power up    Ambulation/Gait Ambulation/Gait assistance: Min guard;Min assist Gait Distance (Feet): 180 Feet Assistive device: Straight cane Gait Pattern/deviations: Step-through pattern;Decreased stride length;Narrow base of support Gait velocity: decr   General Gait Details: mildly unsteady with LOB x 2 (pt was able to self correct) 2* narrow BOS, instructed pt in use of SPC and encouraged her to decrease her velocity and have someone with her when ambulating. Pt reports being "wobbly" is new and is not her baseline.  Stairs            Wheelchair Mobility    Modified Rankin (Stroke Patients Only)       Balance Overall balance assessment: No apparent balance deficits (not formally assessed);Needs assistance Sitting-balance support: Feet supported;No upper extremity supported Sitting balance-Leahy Scale: Good     Standing balance support: Single extremity supported Standing balance-Leahy Scale: Fair Standing balance comment: used cane for ambulation, LOB x 2                             Pertinent Vitals/Pain Pain Assessment: 0-10 Pain Score: 2  Pain Location: L neck/shoulder Pain Descriptors / Indicators: Aching Pain Intervention(s): Limited activity within patient's tolerance;Monitored during session;Premedicated before session;Repositioned    Home  Living Family/patient expects to be discharged to:: Private residence Living Arrangements: Alone Available Help at Discharge: Family;Available 24 hours/day Type of Home: House Home Access: Level entry       Home Layout: One level Home Equipment: Shower  seat;Hand held shower head;Cane - single point Additional Comments: son lives nearby, can help intermittently. Brother is flying in from [place] to stay with her next week.    Prior Function Prior Level of Function : Independent/Modified Independent;Driving             Mobility Comments: walked without AD, denied other falls in past 6 months       Hand Dominance   Dominant Hand: Right    Extremity/Trunk Assessment   Upper Extremity Assessment Upper Extremity Assessment: Defer to OT evaluation LUE Deficits / Details: residual weakness from nerve block    Lower Extremity Assessment Lower Extremity Assessment: Overall WFL for tasks assessed    Cervical / Trunk Assessment Cervical / Trunk Assessment: Normal  Communication   Communication: No difficulties  Cognition Arousal/Alertness: Awake/alert Behavior During Therapy: WFL for tasks assessed/performed Overall Cognitive Status: Within Functional Limits for tasks assessed                                 General Comments: reported pain medicine has made her feel " out of it"        General Comments      Exercises Donning/doffing shirt without moving shoulder: Maximal assistance;Caregiver independent with task Method for sponge bathing under operated UE: Set-up Donning/doffing sling/immobilizer: Maximal assistance;Caregiver independent with task Correct positioning of sling/immobilizer: Moderate assistance;Caregiver independent with task ROM for elbow, wrist and digits of operated UE: Independent;Caregiver independent with task Sling wearing schedule (on at all times/off for ADL's): Independent;Caregiver independent with task Proper positioning of operated UE when showering: Independent;Caregiver independent with task Positioning of UE while sleeping: Independent;Caregiver independent with task   Assessment/Plan    PT Assessment All further PT needs can be met in the next venue of care  PT Problem List  Decreased strength;Decreased mobility;Decreased activity tolerance;Pain;Decreased balance;Decreased knowledge of use of DME       PT Treatment Interventions      PT Goals (Current goals can be found in the Care Plan section)  Acute Rehab PT Goals PT Goal Formulation: All assessment and education complete, DC therapy    Frequency     Barriers to discharge        Co-evaluation               AM-PAC PT "6 Clicks" Mobility  Outcome Measure Help needed turning from your back to your side while in a flat bed without using bedrails?: A Little Help needed moving from lying on your back to sitting on the side of a flat bed without using bedrails?: A Little Help needed moving to and from a bed to a chair (including a wheelchair)?: A Little Help needed standing up from a chair using your arms (e.g., wheelchair or bedside chair)?: A Little Help needed to walk in hospital room?: A Little Help needed climbing 3-5 steps with a railing? : A Little 6 Click Score: 18    End of Session Equipment Utilized During Treatment: Gait belt Activity Tolerance: Patient tolerated treatment well Patient left: in bed;with bed alarm set;with call bell/phone within reach;with family/visitor present Nurse Communication: Mobility status PT Visit Diagnosis: Pain;History of falling (Z91.81);Difficulty in walking, not elsewhere classified (  R26.2) Pain - Right/Left: Left Pain - part of body: Shoulder    Time: 0449-2524 PT Time Calculation (min) (ACUTE ONLY): 26 min   Charges:   PT Evaluation $PT Eval Moderate Complexity: 1 Mod PT Treatments $Gait Training: 8-22 mins       Blondell Reveal Kistler PT 01/16/2021  Acute Rehabilitation Services Pager [phone] Office [phone]

## 2021-01-16 NOTE — Plan of Care (Signed)
Discharge instructions given to the patient and her son. 

## 2021-01-16 NOTE — Discharge Instructions (Signed)
Vania Rea. Supple, M.D., F.A.A.O.S. Orthopaedic Surgery Specializing in Arthroscopic and Reconstructive Surgery of the Shoulder [phone] [address] [place], [place] [address] - Fax [phone]   POST-OP SHOULDER INSTRUCTIONS  1. Follow up in the office for your first post-op appointment 10-14 days from the date of your surgery. If you do not already have a scheduled appointment, our office will contact you to schedule.  2. The bandage over your incision is waterproof. You may begin showering with this dressing on. You may leave this dressing on until first follow up appointment within 2 weeks. We prefer you leave this dressing in place until follow up however after 5-7 days if you are having itching or skin irritation and would like to remove it you may do so. Go slow and tug at the borders gently to break the bond the dressing has with the skin. At this point if there is no drainage it is okay to go without a bandage or you may cover it with a light guaze and tape. You can also expect significant bruising around your shoulder that will drift down your arm and into your chest wall. This is very normal and should resolve over several days.   3. Wear your sling/immobilizer at all times except to perform hygiene or to occasionally let your arm dangle by your side to stretch your elbow. You also need to sleep in your sling immobilizer until instructed otherwise. It is ok to remove your sling if you are sitting in a controlled environment and allow your arm to rest in a position of comfort by your side or on your lap with pillows to give your neck and skin a break from the sling. You may remove it to allow arm to dangle by side to shower. If you are up walking around and when you go to sleep at night you need to wear it.  4. Range of motion to your elbow, wrist, and hand are encouraged 3-5 times daily. Exercise to your hand and fingers helps to reduce swelling you may  experience.   5. Prescriptions for a pain medication and a muscle relaxant are provided for you. It is recommended that if you are experiencing pain that you pain medication alone is not controlling, add the muscle relaxant along with the pain medication which can give additional pain relief. The first 1-2 days is generally the most severe of your pain and then should gradually decrease. As your pain lessens it is recommended that you decrease your use of the pain medications to an "as needed basis'" only and to always comply with the recommended dosages of the pain medications.  6. Pain medications can produce constipation along with their use. If you experience this, the use of an over the counter stool softener or laxative daily is recommended.   7. For additional questions or concerns, please do not hesitate to call the office. If after hours there is an answering service to forward your concerns to the physician on call.  8.Pain control following an exparel block  To help control your post-operative pain you received a nerve block  performed with Exparel which is a long acting anesthetic (numbing agent) which can provide pain relief and sensations of numbness (and relief of pain) in the operative shoulder and arm for up to 3 days. Sometimes it provides mixed relief, meaning you may still have numbness in certain areas of the arm but can still be able to move  parts of  that arm, hand, and fingers. We recommend that your prescribed pain medications  be used as needed. We do not feel it is necessary to "pre medicate" and "stay ahead" of pain.  Taking narcotic pain medications when you are not having any pain can lead to unnecessary and potentially dangerous side effects.    9. Use the ice machine as much as possible in the first 5-7 days from surgery, then you can wean its use to as needed. The ice typically needs to be replaced every 6 hours, instead of ice you can actually freeze water bottles to  put in the cooler and then fill water around them to avoid having to purchase ice. You can have spare water bottles freezing to allow you to rotate them once they have melted. Try to have a thin shirt or light cloth or towel under the ice wrap to protect your skin.   FOR ADDITIONAL INFO ON ICE MACHINE AND INSTRUCTIONS GO TO THE WEBSITE AT  [email or web address]      POST-OP EXERCISES  OK to allow arm to dangle by side and perform elbow wrist and hand range of motion

## 2021-01-16 NOTE — Plan of Care (Signed)
  Problem: Clinical Measurements: Goal: Ability to maintain clinical measurements within normal limits will improve Outcome: Progressing   Problem: Activity: Goal: Risk for activity intolerance will decrease Outcome: Progressing   Problem: Pain Managment: Goal: General experience of comfort will improve Outcome: Progressing   

## 2021-01-16 NOTE — Evaluation (Signed)
Occupational Therapy Evaluation Patient Details Name: Mary Fitzpatrick MRN: [id number] DOB: 02/08/1941 Today's Date: 01/16/2021   History of Present Illness 80 y.o. female s/p fall last week with proximal humerus fx. Now s/p L ORIF 11/1. PMH of arthritis, HLD, HTN, and parathyroid abnormality   Clinical Impression   PTA, pt was living alone with her dog and  independent in ADLs and IADLs. Pt s/p L shoulder ORIF without functional use of L non dominant upper extremity secondary to effects of surgery and interscalene block and shoulder precautions. Therapist provided education and instruction to patient and son in regards to exercises, precautions, positioning, donning upper extremity clothing and bathing while maintaining shoulder precautions, ice and edema management and donning/doffing sling. Patient and son verbalized understanding and demonstrated as needed. Patient needed assistance to donn shirt, sling, underwear, and pants and provided with instruction on compensatory strategies to perform ADLs. Patient to follow up with MD for further therapy needs.       Recommendations for follow up therapy are one component of a multi-disciplinary discharge planning process, led by the attending physician.  Recommendations may be updated based on patient status, additional functional criteria and insurance authorization.   Follow Up Recommendations  Follow physician's recommendations for discharge plan and follow up therapies    Assistance Recommended at Discharge Intermittent Supervision/Assistance  Functional Status Assessment  Patient has had a recent decline in their functional status and demonstrates the ability to make significant improvements in function in a reasonable and predictable amount of time.  Equipment Recommendations  None recommended by OT    Recommendations for Other Services       Precautions / Restrictions Precautions Precautions: Shoulder Type of Shoulder Precautions:  Sling at all times except ADL/exercise Yes  Non weight bearing Yes  AROM elbow, wrist and hand to tolerance Yes  PROM of shoulder No  AROM of shoulder No Shoulder Interventions: Shoulder sling/immobilizer;Off for dressing/bathing/exercises Precaution Booklet Issued: Yes (comment) Required Braces or Orthoses: Sling Restrictions Weight Bearing Restrictions: Yes LUE Weight Bearing: Non weight bearing      Mobility Bed Mobility Overal bed mobility: Modified Independent             General bed mobility comments: increased time, HOB elevated    Transfers Overall transfer level: Needs assistance Equipment used: None Transfers: Sit to/from Stand Sit to Stand: Min assist;Min guard           General transfer comment: min A for initial sit to stand for power up,  but progressing to min guard with second sit to stand.      Balance Overall balance assessment: No apparent balance deficits (not formally assessed)                                         ADL either performed or assessed with clinical judgement   ADL Overall ADL's : Needs assistance/impaired                                             Vision Patient Visual Report: No change from baseline       Perception     Praxis      Pertinent Vitals/Pain Pain Assessment: No/denies pain     Hand Dominance Right   Extremity/Trunk Assessment Upper Extremity Assessment  Upper Extremity Assessment: LUE deficits/detail LUE Deficits / Details: residual weakness from nerve block   Lower Extremity Assessment Lower Extremity Assessment: Defer to PT evaluation   Cervical / Trunk Assessment Cervical / Trunk Assessment: Normal   Communication Communication Communication: No difficulties   Cognition Arousal/Alertness: Awake/alert Behavior During Therapy: WFL for tasks assessed/performed Overall Cognitive Status: Within Functional Limits for tasks assessed                                  General Comments: reported pain medicine has made her feel " out of it"     General Comments       Exercises Exercises: Shoulder   Shoulder Instructions Shoulder Instructions Donning/doffing shirt without moving shoulder: Maximal assistance;Caregiver independent with task Method for sponge bathing under operated UE: Set-up Donning/doffing sling/immobilizer: Maximal assistance;Caregiver independent with task Correct positioning of sling/immobilizer: Moderate assistance;Caregiver independent with task ROM for elbow, wrist and digits of operated UE: Independent;Caregiver independent with task Sling wearing schedule (on at all times/off for ADL's): Independent;Caregiver independent with task Proper positioning of operated UE when showering: Independent;Caregiver independent with task Positioning of UE while sleeping: Independent;Caregiver independent with task    Home Living Family/patient expects to be discharged to:: Private residence Living Arrangements: Alone Available Help at Discharge: Family;Available 24 hours/day Type of Home: House Home Access: Level entry     Home Layout: One level     Bathroom Shower/Tub: Producer, television/film/video: Standard     Home Equipment: Shower seat;Hand held shower head   Additional Comments: son lives nearby, can help intermittently. Brother is flying in from [place] to stay with her next week.      Prior Functioning/Environment Prior Level of Function : Independent/Modified Independent;Driving                        OT Problem List: Decreased strength;Decreased range of motion;Impaired UE functional use      OT Treatment/Interventions:   Caregiver/family education   OT Goals(Current goals can be found in the care plan section)    OT Frequency:     Barriers to D/C:            Co-evaluation              AM-PAC OT "6 Clicks" Daily Activity     Outcome Measure Help from another person eating  meals?: A Little Help from another person taking care of personal grooming?: A Little Help from another person toileting, which includes using toliet, bedpan, or urinal?: A Little Help from another person bathing (including washing, rinsing, drying)?: A Little Help from another person to put on and taking off regular upper body clothing?: A Lot Help from another person to put on and taking off regular lower body clothing?: A Little 6 Click Score: 17   End of Session Equipment Utilized During Treatment: Gait belt Nurse Communication: Mobility status;Other (comment) (pt in need of new ice cuff and Large sling.)  Activity Tolerance: Patient tolerated treatment well Patient left: in chair;with chair alarm set;with family/visitor present  OT Visit Diagnosis: Muscle weakness (generalized) (M62.81)                Time: 0812-0908 OT Time Calculation (min): 56 min Charges:  OT General Charges $OT Visit: 1 Visit OT Evaluation $OT Eval Low Complexity: 1 Low OT Treatments $Self Care/Home Management : 38-52 mins  Prudence Davidson,  OTS Acute Rehab Office: [phone]   Darryl Willner 01/16/2021, 10:37 AM

## 2021-01-16 NOTE — Plan of Care (Signed)
  Problem: Education: Goal: Knowledge of General Education information will improve Description: Including pain rating scale, medication(s)/side effects and non-pharmacologic comfort measures 01/16/2021 0237 by Towanda Malkin, RN Outcome: Progressing 01/15/2021 2121 by Towanda Malkin, RN Outcome: Progressing

## 2021-01-17 ENCOUNTER — Encounter (HOSPITAL_COMMUNITY): Payer: Self-pay | Admitting: Orthopedic Surgery

## 2021-01-22 ENCOUNTER — Telehealth: Payer: Self-pay

## 2021-01-22 NOTE — Telephone Encounter (Signed)
Transition Care Management Follow-up Telephone Call Date of discharge and from where: 01/16/2021  [facility]  How have you been since you were released from the hospital? Doing okay Any questions or concerns? No  Items Reviewed: Did the pt receive and understand the discharge instructions provided? Yes  Medications obtained and verified? Yes  Other? No  Any new allergies since your discharge? No  Dietary orders reviewed? Yes Do you have support at home? Yes  brother is staying with patient  Home Care and Equipment/Supplies: Were home health services ordered? yes If so, what is the name of the agency? [facility]  Has the agency set up a time to come to the patient's home? yes Were any new equipment or medical supplies ordered?  No What is the name of the medical supply agency?  Were you able to get the supplies/equipment? not applicable Do you have any questions related to the use of the equipment or supplies? No  Functional Questionnaire: (I = Independent and D = Dependent) ADLs: Assistance need / brother is helping  Bathing/Dressing- Home health aid   Meal Prep- Brother is assisting  Eating- I  Maintaining continence- I  Transferring/Ambulation- I  Managing Meds- I  Follow up appointments reviewed:  PCP Hospital f/u appt confirmed? No   Specialist Hospital f/u appt confirmed?  Scheduled to see Surgeon on 01/28/2021  Are transportation arrangements needed? No  If their condition worsens, is the pt aware to call PCP or go to the Emergency Dept.? Yes Was the patient provided with contact information for the PCP's office or ED? Yes Was to pt encouraged to call back with questions or concerns? Yes  Rowe Pavy, RN, BSN, CEN [facility] [phone]

## 2021-01-29 ENCOUNTER — Telehealth: Payer: Self-pay | Admitting: Family

## 2021-01-29 NOTE — Telephone Encounter (Signed)
Patient informed the Occupational therapist that she had passed out today around 10 and had called EMS who reccommended she go to the ED to be checked up. She declined and stayed home, and around 2 pm the therapist showed up for her OT visit and the patient informed her of what happened.

## 2021-01-29 NOTE — Telephone Encounter (Signed)
FYI to provider.    I did call pt back to get her see with a provider tomorrow since she did not go to the ER. Pt reports "I feel fine right now. I would prefer not to come to the office tomorrow. After eating something I was better." I have informed her that I will let the provider. Know. Pt would like to wait another day or so since she is "fine now".   Pt declined OV.

## 2021-03-01 ENCOUNTER — Emergency Department: Admit: 2021-03-02 | Payer: MEDICARE | Primary: Internal Medicine

## 2021-03-01 DIAGNOSIS — J101 Influenza due to other identified influenza virus with other respiratory manifestations: Secondary | ICD-10-CM

## 2021-03-01 NOTE — ED Notes (Signed)
Patient arrives via Medic with complaints from family of weakness and ongoing confusion. Patient is unable to answer orientation questions, but states that she is not in any pain, just cold. Husband is at bedside to answer questions. Medics stated that patient has dementia per husband, but hasn't been diagnosed by provider yet.

## 2021-03-01 NOTE — ED Notes (Signed)
Patient PIV inserted. Asked patient to provide a urine sample and stated that she didn't need to go.

## 2021-03-01 NOTE — ED Notes (Signed)
Rounded on patient to put blood pressure cuff back on. Patient still doesn't need to use restroom.

## 2021-03-01 NOTE — ED Notes (Signed)
XR at bedside

## 2021-03-01 NOTE — ED Provider Notes (Signed)
ED  Provider Notes by Orville Govern, [place] at 03/01/21 2133                Author: Orville Govern, [place]  Service: Emergency Medicine  Author Type: Physician Assistant       Filed: 03/02/21 0427  Date of Service: 03/01/21 2133  Status: Attested           Editor: Orville Govern, PA (Physician Assistant)  Cosigner: Loa Socks, MD at 04/02/21 1904          Attestation signed by Loa Socks, MD at 04/02/21 1904          I, Dr. Toni Arthurs Rodr??guez, provided a substantive portion of the care of this patient.  I personally performed the medical decision making, and in  its entirety, for this encounter.       80 year old female with history of dementia who was brought by the husband due to on and off episodes of confusion associated to occasional nausea vomiting and recent decrease in appetite and generalized weakness.  Physical examination was unremarkable.   ED work-up showed positive influenza infection without evidence of pneumonia.  Patient was tolerating p.o. with normal vital signs and without respiratory distress.  Prescription of Tamiflu was given. Patient was comfortable in no distress and nontoxic.   Patient was discharged home with instructions to follow with the primary doctor and to return to the ED if no improvement or worsening of the condition.  Patient's husbad understood and agreed                               [facility]   Emergency Department Treatment Report                Patient: Kathryn [name]  Age: 80 y.o.  Sex: female          Date of Birth: [birth year]  Admit Date: 03/01/2021  PCP: Sena Slate, MD         MRN: [id number]   CSN: [id number]   Attending: Loa Socks, MD         Room: ER11/ER11  Time Dictated: 9:33 PM  APP: Orville Govern           Chief Complaint      Chief Complaint       Patient presents with        ?  Fatigue             History of Present Illness        Patient is a 80 y.o. female brought to ED by EMS from home with her  husband. He is concerned bc patient has been increasingly confused lately.  He reports patient has had a cough for 6 months that is intermittent.  In the last few days patient has not  been eating, not sleeping and has not had any bowel movements and is overall increasingly weak.  He reports patient with intermittent nausea and vomiting with the last episode approximately a week ago.  Has been reports no fever/chills.  Patient is ANO  x1 and knows that she is in the hospital but not which hospital, not what city or state, and knowing here and is unable to repeat her birthday.  Patient is unable to tell how many children she has, grandchildren or of her great granddaughter.  Husband  also reports that they lost their son approximately a month ago but  that patient's memory was worsening prior to that.        Review of Systems        Review of Systems    Constitutional: Negative.  Negative for chills, fever and malaise/fatigue.    HENT: Negative.  Negative for congestion.     Eyes: Negative.  Negative for discharge and redness.    Respiratory:  Positive for cough and sputum production . Negative for shortness of breath and wheezing.     Cardiovascular: Negative.  Negative for chest pain.    Gastrointestinal:  Positive for nausea and vomiting . Negative for abdominal pain and diarrhea.    Genitourinary: Negative.     Musculoskeletal: Negative.  Negative for back pain.    Skin: Negative.  Negative for rash.    Neurological:  Positive for weakness. Negative for headaches.    Endo/Heme/Allergies: Negative.     Psychiatric/Behavioral:  Positive for memory loss.      All other systems reviewed and are negative.         Past Medical/Surgical History          Past Medical History:        Diagnosis  Date         ?  Arrhythmia       ?  Arthritis       ?  CAD (coronary artery disease)       ?  Dementia (HCC)       ?  Diabetes (HCC)       ?  GERD (gastroesophageal reflux disease)       ?  Heart failure (HCC)       ?  History of  left breast cancer            mastectomy         ?  Hypertension       ?  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  COLONOSCOPY  N/A  10/22/2016          COLONOSCOPY, SURVEILLANCE performed by Willaim Rayas, MD at [facility] ENDOSCOPY          ?  HX GYN              Hysterectomy          ?  HX HEENT              Tosillectomy          ?  HX PACEMAKER    2010          [employer] - ICD defribrillator/pacemaker          ?  PR BREAST SURGERY PROCEDURE UNLISTED              Left Mastectomy             Social History          Social History          Socioeconomic  History         ?  Marital status:  MARRIED              Spouse name:  Not on file         ?  Number of children:  Not on file     ?  Years of education:  Not on file     ?  Highest education level:  Not on file       Occupational History        ?  Not on file       Tobacco Use         ?  Smoking status:  Former              Packs/day:  0.25         Years:  0.50         Pack years:  0.13         Types:  Cigarettes         Quit date:  07/01/1967         Years since quitting:  53.7         ?  Smokeless tobacco:  Never       Substance and Sexual Activity         ?  Alcohol use:  No     ?  Drug use:  No     ?  Sexual activity:  Not on file        Other Topics  Concern        ?  Not on file       Social History Narrative        ?  Not on file          Social Determinants of Health          Financial Resource Strain: Not on file     Food Insecurity: Not on file     Transportation Needs: Not on file     Physical Activity: Not on file     Stress: Not on file     Social  Connections: Not on file     Intimate Partner Violence: Not on file       Housing Stability: Not on file             Family History          Family History         Problem  Relation  Age of Onset          ?  Diabetes  Mother       ?  Hypertension  Mother       ?  Diabetes  Father       ?  Hypertension  Father            ?  Stroke  Father               Current Medications          Prior to Admission Medications     Prescriptions  Last Dose  Informant  Patient Reported?  Taking?      ALPRAZolam (XANAX) 0.5 mg tablet      Yes  No      Sig: Take 0.5 mg by mouth nightly as needed for Anxiety.      Cholecalciferol, Vitamin D3, 5,000 unit Tab      Yes  No      Sig: Take  by mouth daily.      allopurinol (ZYLOPRIM) 100 mg tablet      Yes  No      Sig: Take 100 mg by mouth daily.      aspirin 81 mg tablet      Yes  No      Sig: Take 81 mg by mouth daily.      carvedilol (COREG) 3.125 mg tablet      Yes  No      Sig: Take 3.125 mg by mouth two (2) times a day. 2TABS IN AM, 1 IN THE EVENING      citalopram (CELEXA) 20 mg tablet      Yes  No      Sig: Take 20 mg by mouth daily.      furosemide (LASIX) 20 mg tablet      Yes  No      Sig: Take 20 mg by mouth daily.      gabapentin (NEURONTIN) 300 mg tablet      Yes  No      Sig: Take 300 mg by mouth daily.      losartan (COZAAR) 25 mg tablet      Yes  No      Sig: Take 25 mg by mouth daily.      metFORMIN (GLUCOPHAGE) 500 mg tablet      Yes  No      Sig: Take 500 mg by mouth daily (with breakfast).      simvastatin (ZOCOR) 20 mg tablet      Yes  No      Sig: Take 20 mg by mouth nightly.      spironolactone (ALDACTONE) 25 mg tablet      Yes  No      Sig: Take 25 mg by mouth daily. 1/2 TAB DAILY               Facility-Administered Medications: None             Allergies          Allergies        Allergen  Reactions         ?  Pcn [Penicillins]  Itching         ?  Sulfa (Sulfonamide Antibiotics)  Hives             Physical Exam          ED Triage Vitals [03/01/21 2045]     ED  Encounter Vitals Group           BP  123/65        Pulse (Heart Rate)  94        Resp Rate  25        Temp  98.3 ??F (36.8 ??C)        Temp src          O2 Sat (%)  98 %        Weight             Height             Physical Exam   Vitals and nursing note reviewed.    Constitutional:        General: She is not in acute distress.      Appearance: Normal appearance. She is well-developed and normal weight. She is not ill-appearing.    HENT:       Head: Normocephalic and atraumatic.  Nose: Nose normal.       Mouth/Throat:       Mouth: Mucous membranes are moist.    Cardiovascular:       Rate and Rhythm: Normal rate and regular rhythm.       Heart sounds: Normal heart sounds. No murmur heard.     No friction rub. No gallop.    Pulmonary:       Effort: Pulmonary effort is normal.       Breath sounds: Normal breath sounds. No wheezing, rhonchi or rales.    Chest:       Chest wall: No mass or deformity.    Abdominal:       Palpations: Abdomen is soft.       Tenderness: There is no abdominal tenderness. There is no guarding or rebound.     Musculoskeletal:          General: Normal range of motion.       Cervical back: Normal range of motion and neck supple.    Skin:      General: Skin is warm and dry.    Neurological:       General: No focal deficit present.       Mental Status: She is alert and oriented to person, place, and time.    Psychiatric:          Mood and Affect: Mood normal.          Behavior: Behavior normal.              Impression and Management Plan        Patient is 80 year old female brought to the ED by her husband with an increasing confusion recently with 2 days of not eating, not sleeping, no BMs with occasional nausea and vomiting although no fever or chills.  Patient with obvious significant dementia  and is prescribed Aricept.  Will order CBC, CMP, urinalysis, CXR, EKG, troponin and COVID/flu/RSV.           Diagnostic Studies        Labs:      Recent Results (from the past 12 hour(s))     CBC WITH  AUTOMATED DIFF          Collection Time: 03/01/21 10:18 PM         Result  Value  Ref Range            WBC  8.3  4.0 - 11.0 1000/mm3       RBC  3.91  3.60 - 5.20 M/uL       HGB  11.8 (L)  13.0 - 17.2 gm/dl       HCT  04.5 (L)  40.9 - 50.0 %       MCV  91.0  80.0 - 98.0 fL       MCH  30.2  25.4 - 34.6 pg       MCHC  33.1  30.0 - 36.0 gm/dl       PLATELET  811  914 - 450 1000/mm3       MPV  9.6  6.0 - 10.0 fL       RDW-SD  48.3 (H)  36.4 - 46.3         NRBC  0  0 - 0         IMMATURE GRANULOCYTES  0.4  0.0 - 3.0 %       NEUTROPHILS  82.7 (H)  34 - 64 %  LYMPHOCYTES  12.1 (L)  28 - 48 %       MONOCYTES  3.5  1 - 13 %       EOSINOPHILS  1.1  0 - 5 %       BASOPHILS  0.2  0 - 3 %       METABOLIC PANEL, COMPREHENSIVE          Collection Time: 03/01/21 10:18 PM         Result  Value  Ref Range            Potassium  4.6  3.5 - 5.1 mEq/L            Chloride  110 (H)  98 - 107 mEq/L       Sodium  141  136 - 145 mEq/L       CO2  22  20 - 31 mEq/L       Glucose  99  74 - 106 mg/dl       BUN  12  9 - 23 mg/dl       Creatinine  0.98 (H)  0.55 - 1.02 mg/dl       GFR est AA  11.9          GFR est non-AA  44          Calcium  9.3  8.7 - 10.4 mg/dl       Anion gap  9  5 - 15 mmol/L       AST (SGOT)  29.0  0.0 - 33.9 U/L       ALT (SGPT)  13  10 - 49 U/L       Alk. phosphatase  80  46 - 116 U/L       Bilirubin, total  0.80  0.30 - 1.20 mg/dl       Protein, total  7.2  5.7 - 8.2 gm/dl       Albumin  4.0  3.4 - 5.0 gm/dl       TROPONIN-HIGH SENSITIVITY          Collection Time: 03/01/21 10:18 PM         Result  Value  Ref Range            Troponin-High Sensitivity  4  0 - 34 ng/L       SARS-COV-2_FLU_RSV          Collection Time: 03/01/21 11:10 PM       Specimen: NASOPHARYNGEAL SWAB         Result  Value  Ref Range            COVID-19  NEGATIVE  NEGATIVE         Influenza A PCR  POSITIVE (A)  NEGATIVE         Influenza B PCR  NEGATIVE  NEGATIVE         RSV by PCR  NEGATIVE  NEGATIVE         POC URINE MACROSCOPIC          Collection  Time: 03/02/21  1:03 AM         Result  Value  Ref Range            Glucose  Negative  NEGATIVE,Negative mg/dl       Bilirubin  Negative  NEGATIVE,Negative         Ketone  Negative  NEGATIVE,Negative mg/dl       Specific gravity  1.025  1.005 - 1.030         Blood  Negative  NEGATIVE,Negative         pH (UA)  5.5  5 - 9         Protein  Negative  NEGATIVE,Negative mg/dl       Urobilinogen  0.2  0.0 - 1.0 EU/dl       Nitrites  Negative  NEGATIVE,Negative         Leukocyte Esterase  Negative  NEGATIVE,Negative         Color  Yellow          Appearance  Clear          URINALYSIS W/ RFLX MICROSCOPIC          Collection Time: 03/02/21  1:30 AM         Result  Value  Ref Range            Color  YELLOW  YELLOW,STRAW         Appearance  CLEAR  CLEAR         Glucose  NEGATIVE  NEGATIVE,Negative mg/dl       Bilirubin  NEGATIVE  NEGATIVE,Negative         Ketone  NEGATIVE  NEGATIVE,Negative mg/dl       Specific gravity  1.025  1.005 - 1.030         Blood  NEGATIVE  NEGATIVE,Negative         pH (UA)  5.5  5.0 - 9.0         Protein  NEGATIVE  NEGATIVE,Negative mg/dl       Urobilinogen  0.2  0.0 - 1.0 mg/dl       Nitrites  NEGATIVE  NEGATIVE,Negative              Leukocyte Esterase  NEGATIVE  NEGATIVE,Negative            Labs Reviewed       SARS-COV-2_FLU_RSV - Abnormal; Notable for the following components:            Result  Value            Influenza A PCR  POSITIVE (*)            All other components within normal limits       CBC WITH AUTOMATED DIFF - Abnormal; Notable for the following components:            HGB  11.8 (*)         HCT  35.6 (*)         RDW-SD  48.3 (*)         NEUTROPHILS  82.7 (*)         LYMPHOCYTES  12.1 (*)            All other components within normal limits       METABOLIC PANEL, COMPREHENSIVE - Abnormal; Notable for the following components:            Chloride  110 (*)         Creatinine  1.24 (*)            All other components within normal limits       URINALYSIS W/ RFLX MICROSCOPIC      TROPONIN-HIGH SENSITIVITY       POC URINE MACROSCOPIC              Imaging:     XR CHEST SNGL V  Result Date: 03/01/2021   EXAM: XR CHEST SNGL V INDICATION: cough, weakness  COMPARISON: 05/02/2020 WORKSTATION ID: [id number] TECHNIQUE: Frontal view. FINDINGS: SUPPORT DEVICES: Stable right cardiac device and leads. LUNGS/PLEURA: No consolidation or pleural effusion. HEART/MEDIASTINUM:  Cardiomediastinal silhouette is normal. OTHER: None.       IMPRESSION: No acute cardiopulmonary process. Electronically signed by: Albin Felling, MD 03/01/2021 10:03 PM EST           Imaging: CXR reveals no acute findings as interpreted by myself.          EKG interpretation by Loa Socks, MD in ED course. @11 :41 PM with ventricular rate of 89 with atrial sensed ventricular paced rhythm with a QTC 474              Medical Decision Making/ ED Course           I have reviewed the patient's vital signs, available nursing notes, pertinent old medical records, past medical history, past surgical, family and social history.        ED Course & Medical Decision Making:     Patient remained stable during the time they were under my care during this emergency department visit, and remained hemodynamically stable.      CBC with hemoglobin 11.8, neutrophils 82.7, lymphs 12.1, CMP with CL 110, creatinine 1.24 otherwise negative, troponin 4, glucose 99   Urinalysis negative   Patient positive for influenza A we will give patient Rx for Tamiflu.  Dr. Loa Socks discussed results with patient and her husband and recommended he try to keep her hydrated.  Rx for Zofran and Tamiflu sent to pharmacy.  Return to ED  precautions discussed and all questions answered.            Meds given during ER visit:     Medications       sodium chloride (NS) flush 5-10 mL (has no administration in time range)       oseltamivir (TAMIFLU) capsule 75 mg (75 mg Oral Given 03/02/21 0143)              This patient was evaluated in the  emergency department for symptoms described in the history of present illness. They were evaluated in the context of global COVID-19 pandemic, which necessitated consideration of the patient may be at  risk for infection  with the SARS-COV-2 virus that causes COVID-19.  Institutional protocols and algorithms that pertain to the evaluation of patients at risk for COVID-19 are in a state of rapid change based on the information released by regulatory bodies including the  CDC and federal state organizations. These policies and algorithms were followed during the patient's care in the emergency department.             Final Diagnosis                 ICD-10-CM  ICD-9-CM          1.  Influenza A   J10.1  487.1     2.  Nausea and vomiting, unspecified vomiting type   R11.2  787.01     3.  Decreased appetite   R63.0  783.0     4.  Weakness   R53.1  780.79          5.  Moderate dementia without behavioral disturbance, psychotic disturbance, mood disturbance, or anxiety, unspecified dementia type   F03.B0  294.20             Disposition  Disposition and plan:   Patient was discharged home in stable condition with discharge instructions on the same.       Return to the ER if condition worsens or new symptoms develop & follow up with primary care/specialist as discussed.       The patient was personally evaluated by myself and discussed with Loa Socks, MD whom agrees with the above assessment and plan.      Dragon medical dictation software was used for portions of this report. Unintended errors may occur.       Rosalie Doctor, MPA, PA-C   March 02, 2021      My signature above authenticates this document and my orders, the final     diagnosis (es), discharge prescription (s), and instructions in the Epic     record.   If you have any questions please contact [phone].       Nursing notes have been reviewed by the physician/advanced practice     Clinician.

## 2021-03-02 ENCOUNTER — Inpatient Hospital Stay
Admit: 2021-03-02 | Discharge: 2021-03-02 | Disposition: A | Discharge status: Home / Self Care | Payer: MEDICARE | Attending: Emergency Medicine

## 2021-03-02 LAB — TROPONIN, HIGH SENSITIVITY: Troponin, High Sensitivity: 4 ng/L (ref 0–34)

## 2021-03-02 LAB — CBC WITH AUTO DIFFERENTIAL
Basophils %: 0.2 % (ref 0–3)
Eosinophils %: 1.1 % (ref 0–5)
Hematocrit: 35.6 % — ABNORMAL LOW (ref 37.0–50.0)
Hemoglobin: 11.8 gm/dl — ABNORMAL LOW (ref 13.0–17.2)
Immature Granulocytes: 0.4 % (ref 0.0–3.0)
Lymphocytes %: 12.1 % — ABNORMAL LOW (ref 28–48)
MCH: 30.2 pg (ref 25.4–34.6)
MCHC: 33.1 gm/dl (ref 30.0–36.0)
MCV: 91 fL (ref 80.0–98.0)
MPV: 9.6 fL (ref 6.0–10.0)
Monocytes %: 3.5 % (ref 1–13)
Neutrophils %: 82.7 % — ABNORMAL HIGH (ref 34–64)
Nucleated RBCs: 0 (ref 0–0)
Platelets: 240 10*3/uL (ref 140–450)
RBC: 3.91 M/uL (ref 3.60–5.20)
RDW-SD: 48.3 — ABNORMAL HIGH (ref 36.4–46.3)
WBC: 8.3 10*3/uL (ref 4.0–11.0)

## 2021-03-02 LAB — POC URINE MACROSCOPIC
Bilirubin, Urine: NEGATIVE
Bilirubin: NEGATIVE
Blood, Urine: NEGATIVE
Blood: NEGATIVE
Glucose, Ur: NEGATIVE mg/dl
Glucose: NEGATIVE mg/dl
Ketone: NEGATIVE mg/dl
Ketones, Urine: NEGATIVE mg/dl
Leukocyte Esterase, Urine: NEGATIVE
Leukocyte Esterase: NEGATIVE
Nitrite, Urine: NEGATIVE
Nitrites: NEGATIVE
Protein, UA: NEGATIVE mg/dl
Protein: NEGATIVE mg/dl
Specific Gravity, UA: 1.025 (ref 1.005–1.030)
Specific gravity: 1.025 (ref 1.005–1.030)
Urobilinogen, UA, POCT: 0.2 EU/dl (ref 0.0–1.0)
Urobilinogen: 0.2 EU/dl (ref 0.0–1.0)
pH (UA): 5.5 (ref 5–9)
pH, UA: 5.5 (ref 5–9)

## 2021-03-02 LAB — URINALYSIS W/ RFLX MICROSCOPIC
Bilirubin, Urine: NEGATIVE
Bilirubin: NEGATIVE
Blood, Urine: NEGATIVE
Blood: NEGATIVE
Glucose, Ur: NEGATIVE mg/dl
Glucose: NEGATIVE mg/dl
Ketone: NEGATIVE mg/dl
Ketones, Urine: NEGATIVE mg/dl
Leukocyte Esterase, Urine: NEGATIVE
Leukocyte Esterase: NEGATIVE
Nitrite, Urine: NEGATIVE
Nitrites: NEGATIVE
Protein, UA: NEGATIVE mg/dl
Protein: NEGATIVE mg/dl
Specific Gravity, UA: 1.025 (ref 1.005–1.030)
Specific gravity: 1.025 (ref 1.005–1.030)
Urobilinogen, UA, POCT: 0.2 mg/dl (ref 0.0–1.0)
Urobilinogen: 0.2 mg/dl (ref 0.0–1.0)
pH (UA): 5.5 (ref 5.0–9.0)
pH, UA: 5.5 (ref 5.0–9.0)

## 2021-03-02 LAB — COMPREHENSIVE METABOLIC PANEL
ALT: 13 U/L (ref 10–49)
AST: 29 U/L (ref 0.0–33.9)
Albumin: 4 gm/dl (ref 3.4–5.0)
Alkaline Phosphatase: 80 U/L (ref 46–116)
Anion Gap: 9 mmol/L (ref 5–15)
BUN: 12 mg/dl (ref 9–23)
CO2: 22 mEq/L (ref 20–31)
Calcium: 9.3 mg/dl (ref 8.7–10.4)
Chloride: 110 mEq/L — ABNORMAL HIGH (ref 98–107)
Creatinine: 1.24 mg/dl — ABNORMAL HIGH (ref 0.55–1.02)
EGFR IF NonAfrican American: 44
GFR African American: 54
Glucose: 99 mg/dl (ref 74–106)
Potassium: 4.6 mEq/L (ref 3.5–5.1)
Sodium: 141 mEq/L (ref 136–145)
Total Bilirubin: 0.8 mg/dl (ref 0.30–1.20)
Total Protein: 7.2 gm/dl (ref 5.7–8.2)

## 2021-03-02 LAB — SARS-COV-2_FLU_RSV
COVID-19: NEGATIVE
Influenza A PCR: POSITIVE — AB
Influenza B PCR: NEGATIVE
RSV by PCR: NEGATIVE

## 2021-03-02 LAB — CBC WITH AUTOMATED DIFF
BASOPHILS: 0.2 % (ref 0–3)
EOSINOPHILS: 1.1 % (ref 0–5)
HCT: 35.6 % — ABNORMAL LOW (ref 37.0–50.0)
HGB: 11.8 gm/dl — ABNORMAL LOW (ref 13.0–17.2)
IMMATURE GRANULOCYTES: 0.4 % (ref 0.0–3.0)
LYMPHOCYTES: 12.1 % — ABNORMAL LOW (ref 28–48)
MCH: 30.2 pg (ref 25.4–34.6)
MCHC: 33.1 gm/dl (ref 30.0–36.0)
MCV: 91 fL (ref 80.0–98.0)
MONOCYTES: 3.5 % (ref 1–13)
MPV: 9.6 fL (ref 6.0–10.0)
NEUTROPHILS: 82.7 % — ABNORMAL HIGH (ref 34–64)
NRBC: 0 (ref 0–0)
PLATELET: 240 10*3/uL (ref 140–450)
RBC: 3.91 M/uL (ref 3.60–5.20)
RDW-SD: 48.3 — ABNORMAL HIGH (ref 36.4–46.3)
WBC: 8.3 10*3/uL (ref 4.0–11.0)

## 2021-03-02 LAB — METABOLIC PANEL, COMPREHENSIVE
ALT (SGPT): 13 U/L (ref 10–49)
AST (SGOT): 29 U/L (ref 0.0–33.9)
Albumin: 4 gm/dl (ref 3.4–5.0)
Alk. phosphatase: 80 U/L (ref 46–116)
Anion gap: 9 mmol/L (ref 5–15)
BUN: 12 mg/dl (ref 9–23)
Bilirubin, total: 0.8 mg/dl (ref 0.30–1.20)
CO2: 22 mEq/L (ref 20–31)
Calcium: 9.3 mg/dl (ref 8.7–10.4)
Chloride: 110 mEq/L — ABNORMAL HIGH (ref 98–107)
Creatinine: 1.24 mg/dl — ABNORMAL HIGH (ref 0.55–1.02)
GFR est AA: 54
GFR est non-AA: 44
Glucose: 99 mg/dl (ref 74–106)
Potassium: 4.6 mEq/L (ref 3.5–5.1)
Protein, total: 7.2 gm/dl (ref 5.7–8.2)
Sodium: 141 mEq/L (ref 136–145)

## 2021-03-02 LAB — TROPONIN-HIGH SENSITIVITY: Troponin-High Sensitivity: 4 ng/L (ref 0–34)

## 2021-03-02 MED ORDER — OSELTAMIVIR PHOSPHATE 75 MG CAP
75 mg | ORAL | Status: AC
Start: 2021-03-02 — End: 2021-03-02
  Administered 2021-03-02: 07:00:00 via ORAL

## 2021-03-02 MED ORDER — OSELTAMIVIR PHOSPHATE 75 MG CAP
75 mg | ORAL_CAPSULE | Freq: Two times a day (BID) | ORAL | 0 refills | Status: AC
Start: 2021-03-02 — End: 2021-03-07

## 2021-03-02 MED ORDER — ONDANSETRON 4 MG TAB, RAPID DISSOLVE
4 mg | ORAL_TABLET | Freq: Three times a day (TID) | ORAL | 0 refills | Status: DC | PRN
Start: 2021-03-02 — End: 2021-03-02

## 2021-03-02 MED ORDER — SODIUM CHLORIDE 0.9 % IJ SYRG
Freq: Once | INTRAMUSCULAR | Status: AC
Start: 2021-03-02 — End: 2021-03-02

## 2021-03-02 MED ORDER — ONDANSETRON 4 MG TAB, RAPID DISSOLVE
4 mg | ORAL_TABLET | Freq: Three times a day (TID) | ORAL | 0 refills | Status: AC | PRN
Start: 2021-03-02 — End: 2021-03-21

## 2021-03-02 MED FILL — OSELTAMIVIR PHOSPHATE 75 MG CAP: 75 mg | ORAL | Qty: 1

## 2021-03-02 NOTE — ED Notes (Signed)
Patient bladder scanned and had 128ml . Patient was straightcathed to provide urine sample.

## 2021-03-02 NOTE — ED Notes (Signed)
Patient given discharge instructions. Transported patient via wheelchair to front of ED to be driven home by daughter.

## 2021-03-03 LAB — EKG 12-LEAD
Atrial Rate: 89 {beats}/min
P Axis: 52 degrees
P-R Interval: 124 ms
Q-T Interval: 390 ms
QRS Duration: 102 ms
QTc Calculation (Bazett): 474 ms
R Axis: 94 degrees
T Axis: 68 degrees
Ventricular Rate: 89 {beats}/min

## 2021-03-03 LAB — EKG, 12 LEAD, INITIAL
Atrial Rate: 89 {beats}/min
Calculated P Axis: 52 degrees
Calculated R Axis: 94 degrees
Calculated T Axis: 68 degrees
P-R Interval: 124 ms
Q-T Interval: 390 ms
QRS Duration: 102 ms
QTC Calculation (Bezet): 474 ms
Ventricular Rate: 89 {beats}/min

## 2021-03-21 NOTE — Interval H&P Note (Signed)
PREOPERATIVE INSTRUCTIONS  Please Read Carefully    [x]  Your procedure is scheduled on: 03/25/2021    [x]  The day before your surgery, call the surgeon's office to check on the surgery time and the time you should arrive.     [x]  On the day of your surgery, arrive at the time given to you by your surgeon and check in at the first floor registration desk.    [x]  DO NOT eat or drink anything after midnight before your surgery. This includes gum, mints or hard candy.    [x]  You may brush your teeth the morning of surgery, however DO NOT swallow any water.    []  No smoking after midnight.  Smoking should be reduced a few days prior to surgery.    [x]  If you are having an outpatient procedure, you must have a responsible adult bring you to the hospital.  They must remain in the surgical waiting area for the duration of your procedure and provide you with transportation home.  You may not drive yourself home.  We also recommend that you arrange for a responsible person to stay with you for 24 hours following your procedure.  Failure to comply with these instructions may result in cancellation of the procedure.    []  A parent or legal guardian must accompany minors to the hospital.  LEGAL GUARDIANS MUST bring custody papers with them the day of surgery.    []  If the lab gives you a blue blood ID band, do not throw it away.  Bring it with you on the day of surgery.      []  Please return to preop surgical testing no more than 14 days before surgery date to have your type and screen done prior to surgery.    []  If you have been diagnosed with Sleep Apnea and are using a CPAP, BiPAP, and/or Bi-Flex machine, please bring it with you the day of surgery.    [x]  Endoscopy patients, please follow the instructions provided by the doctors office.    []  If crutches are ordered, you must get them and be instructed on proper use before the day of surgery.  Please bring them with you the day of surgery.      PREPARING THE SKIN  BEFORE SURGERY Can Reduce the Risk of Infection  []  You have been given a Chlorhexidine skin preparation packet with instructions to bathe the night before surgery and the morning of surgery prior to arrival.    []  If allergic to Chlorhexidine, use Dial (antibacterial) soap to bathe your skin the night before surgery and the morning of surgery.    []  Do not shave your face, underarms, legs or any part of your body at least 48 hours before surgery.  Any clipping needed for surgery will be done at the hospital.      PREPARING TO COME TO THE HOSPITAL  [x]  Wear casual, loose fitting comfortable clothes the day of surgery.    [x]  Remove ALL jewelry, including body piercings.  Leave these and all valuables at home.  Leave suitcases and/or overnight bag at home or in the car for a family member to bring when you get a room.    [x]  DO NOT wear any makeup, nail polish, deodorant, body lotion, aftershave or contact lenses the day of surgery.  Please bring your glasses and glasses case with you the day of surgery.    [x]  Bring any additional paperwork, such as doctors orders, consent form, POA (  power of attorney), and/or advanced directives with you the day of surgery.    [x]  Please notify your surgeon of any changes in your condition such as fever, sore throat or rash as soon as possible before your surgery date.  After office hours, call your surgeon's answering service.    [x] Bring picture identification and insurance cards with you the day of surgery.      MEDICATIONS:  [x]  Stop taking aspirin and aspirin products/NSAIDs (non-steroidal anti-inflammatory drugs such as Motrin, Aleve, Advil, ibuprofen and BC Powder), vitamins and herbal pills 2 weeks before surgery OR as instructed by your doctor.  However, if you take a daily aspirin, please contact your primary care provider (PCP), for instructions prior to surgery.    []  Contact your doctor regarding any blood thinner medications you take (such as Coumadin, Plavix, etc.)  for instructions about what to do prior to surgery.    [] If you have diabetes, hold oral diabetic medications the night before surgery and the morning of surgery.  Hold insulin the morning of surgery unless told otherwise by your doctor.    []  If you have asthma or use inhalers please bring them the day of surgery.    [] Take the following medicine with a sip of water the day of surgery: n/a    Current Home Medication List:  Medication Documentation Review Audit       Reviewed by Lindwood Qua, RN (Registered Nurse) on 03/21/21 at 1517      Medication Sig Documenting Provider Last Dose Status Taking?   allopurinol (ZYLOPRIM) 100 mg tablet Take 100 mg by mouth daily. Provider, Historical  Active    aspirin 81 mg tablet Take 81 mg by mouth daily. Provider, Historical  Active    atorvastatin (LIPITOR) 20 mg tablet Take 20 mg by mouth nightly. Provider, Historical  Active    donepeziL (ARICEPT) 10 mg tablet nightly. Provider, Historical  Active    Entresto 49-51 mg tab tablet Take 1 Tablet by mouth two (2) times a day. Provider, Historical  Active    furosemide (LASIX) 20 mg tablet Take 20 mg by mouth daily. Provider, Historical  Active    mirtazapine (REMERON) 15 mg tablet Take 15 mg by mouth nightly. Provider, Historical  Active    spironolactone (ALDACTONE) 25 mg tablet Take 25 mg by mouth daily. 1/2 TAB DAILY Provider, Historical  Active    traZODone (DESYREL) 50 mg tablet Take  by mouth nightly. Provider, Historical  Active                      *Visitor Policy-One visitor per patient, must be 20 years of age or older, must wear a mask. Upon check in, the patient must arrive with their post procedure transportation home. The post-procedure transporting party must remain within the surgical waiting room for the duration of the procedure. Failure may result in the cancellation of the procedure*      We want you to have a positive experience at [facility].  If any of these these instructions are  not met, it is possible that your surgery will be canceled.  If you have any questions regarding your surgery, please call [facility] at [phone] or your surgeon's office for further assistance.

## 2021-03-25 ENCOUNTER — Inpatient Hospital Stay: Payer: MEDICARE

## 2021-03-25 LAB — POCT GLUCOSE: POC Glucose: 109 mg/dL — ABNORMAL HIGH (ref 65–105)

## 2021-03-25 LAB — GLUCOSE, POC: Glucose (POC): 109 mg/dL — ABNORMAL HIGH (ref 65–105)

## 2021-03-25 MED ORDER — PROPOFOL 10 MG/ML IV EMUL
10 mg/mL | INTRAVENOUS | Status: AC
Start: 2021-03-25 — End: ?

## 2021-03-25 MED ORDER — CIPROFLOXACIN IN D5W 400 MG/200 ML IV PIGGY BACK
400 mg/200 mL | INTRAVENOUS | Status: DC | PRN
Start: 2021-03-25 — End: 2021-03-25
  Administered 2021-03-25: 14:00:00 via INTRAVENOUS

## 2021-03-25 MED ORDER — SODIUM CHLORIDE 0.9 % IV
INTRAVENOUS | Status: DC
Start: 2021-03-25 — End: 2021-03-25
  Administered 2021-03-25: 13:00:00 via INTRAVENOUS

## 2021-03-25 MED ORDER — DEXMEDETOMIDINE 100 MCG/ML IV SOLN
100 mcg/mL | INTRAVENOUS | Status: DC | PRN
Start: 2021-03-25 — End: 2021-03-25
  Administered 2021-03-25 (×2): via INTRAVENOUS

## 2021-03-25 MED ORDER — SODIUM CHLORIDE 0.9 % IJ SYRG
INTRAMUSCULAR | Status: DC | PRN
Start: 2021-03-25 — End: 2021-03-25

## 2021-03-25 MED ORDER — ALBUMIN, HUMAN 5 % IV
5 % | Freq: Once | INTRAVENOUS | Status: DC
Start: 2021-03-25 — End: 2021-03-25
  Administered 2021-03-25: 16:00:00 via INTRAVENOUS

## 2021-03-25 MED ORDER — DEXMEDETOMIDINE 100 MCG/ML IV SOLN
100 mcg/mL | INTRAVENOUS | Status: AC
Start: 2021-03-25 — End: ?

## 2021-03-25 MED ORDER — SODIUM CHLORIDE 0.9 % IJ SYRG
Freq: Three times a day (TID) | INTRAMUSCULAR | Status: DC
Start: 2021-03-25 — End: 2021-03-25

## 2021-03-25 MED ORDER — SODIUM CHLORIDE 0.9 % IV
INTRAVENOUS | Status: DC | PRN
Start: 2021-03-25 — End: 2021-03-25
  Administered 2021-03-25: 14:00:00 via INTRAVENOUS

## 2021-03-25 MED ORDER — CIPROFLOXACIN IN D5W 400 MG/200 ML IV PIGGY BACK
400 mg/200 mL | Freq: Once | INTRAVENOUS | Status: DC
Start: 2021-03-25 — End: 2021-03-25

## 2021-03-25 MED ORDER — LACTATED RINGERS IV
INTRAVENOUS | Status: DC
Start: 2021-03-25 — End: 2021-03-25

## 2021-03-25 MED ORDER — PROPOFOL 10 MG/ML IV EMUL
10 mg/mL | INTRAVENOUS | Status: DC | PRN
Start: 2021-03-25 — End: 2021-03-25
  Administered 2021-03-25 (×3): via INTRAVENOUS

## 2021-03-25 MED ORDER — GLYCOPYRROLATE 0.2 MG/ML IJ SOLN
0.2 mg/mL | INTRAMUSCULAR | Status: DC | PRN
Start: 2021-03-25 — End: 2021-03-25
  Administered 2021-03-25: 14:00:00 via INTRAVENOUS

## 2021-03-25 MED ORDER — GLYCOPYRROLATE 0.2 MG/ML IJ SOLN
0.2 mg/mL | INTRAMUSCULAR | Status: AC
Start: 2021-03-25 — End: ?

## 2021-03-25 MED ORDER — SODIUM CHLORIDE 0.9 % IV
10 mg/mL | INTRAVENOUS | Status: DC | PRN
Start: 2021-03-25 — End: 2021-03-25
  Administered 2021-03-25 (×9): via INTRAVENOUS

## 2021-03-25 MED ORDER — PROPOFOL INFUSION
10 mg/mL | INTRAVENOUS | Status: DC | PRN
Start: 2021-03-25 — End: 2021-03-25
  Administered 2021-03-25 (×8): via INTRAVENOUS

## 2021-03-25 MED FILL — ALBUTEIN 5 % INTRAVENOUS SOLUTION: 5 % | INTRAVENOUS | Qty: 250

## 2021-03-25 MED FILL — LACTATED RINGERS IV: INTRAVENOUS | Qty: 1000

## 2021-03-25 MED FILL — PROPOFOL 10 MG/ML IV EMUL: 10 mg/mL | INTRAVENOUS | Qty: 20

## 2021-03-25 MED FILL — DEXMEDETOMIDINE 100 MCG/ML IV SOLN: 100 mcg/mL | INTRAVENOUS | Qty: 2

## 2021-03-25 MED FILL — CIPROFLOXACIN IN D5W 400 MG/200 ML IV PIGGY BACK: 400 mg/200 mL | INTRAVENOUS | Qty: 200

## 2021-03-25 MED FILL — SODIUM CHLORIDE 0.9 % IV: INTRAVENOUS | Qty: 1000

## 2021-03-25 MED FILL — GLYCOPYRROLATE 0.2 MG/ML IJ SOLN: 0.2 mg/mL | INTRAMUSCULAR | Qty: 1

## 2021-03-25 MED FILL — PROPOFOL 10 MG/ML IV EMUL: 10 mg/mL | INTRAVENOUS | Qty: 50

## 2021-03-25 NOTE — Anesthesia Pre-Procedure Evaluation (Signed)
Relevant Problems   No relevant active problems       Anesthetic History   No history of anesthetic complications            Review of Systems / Medical History  Patient summary reviewed, nursing notes reviewed and pertinent labs reviewed    Pulmonary  Within defined limits                 Neuro/Psych   Within defined limits           Cardiovascular    Hypertension        Dysrhythmias   Pacemaker and CAD         GI/Hepatic/Renal     GERD           Endo/Other    Diabetes: well controlled    Arthritis and cancer     Other Findings   Comments: Left breast cancer           Physical Exam    Airway  Mallampati: II  TM Distance: 4 - 6 cm  Neck ROM: normal range of motion   Mouth opening: Normal     Cardiovascular  Regular rate and rhythm,  S1 and S2 normal,  no murmur, click, rub, or gallop             Dental    Dentition: Upper partial plate and Lower partial plate     Pulmonary  Breath sounds clear to auscultation               Abdominal  Abdominal exam normal       Other Findings            Anesthetic Plan    ASA: 2  Anesthesia type: general          Induction: Intravenous  Anesthetic plan and risks discussed with: Patient

## 2021-03-25 NOTE — Anesthesia Post-Procedure Evaluation (Signed)
Procedure(s):  ENDOSCOPIC ULTRASOUND (EUS) UPPER WITH FNA.    MAC    Anesthesia Post Evaluation      Multimodal analgesia: multimodal analgesia used between 6 hours prior to anesthesia start to PACU discharge  Patient location during evaluation: PACU  Patient participation: complete - patient participated  Level of consciousness: awake and alert  Pain management: adequate  Airway patency: patent  Anesthetic complications: no  Cardiovascular status: acceptable  Respiratory status: acceptable  Hydration status: acceptable  Post anesthesia nausea and vomiting:  controlled  Final Post Anesthesia Temperature Assessment:  Normothermia (36.0-37.5 degrees C)      INITIAL Post-op Vital signs:   Vitals Value Taken Time   BP 98/60 03/25/21 1030   Temp     Pulse 87 03/25/21 1039   Resp 24 03/25/21 1039   SpO2 97 % 03/25/21 1039   Vitals shown include unvalidated device data.

## 2021-03-25 NOTE — Interval H&P Note (Signed)
1005-Informed Dr. Threasa Beards of patient's continued low BP's despite being asymptomatic. Dr. Threasa Beards stated to order albumin and patient will be transferred to pacu for closer observation.  Husband at bedside and Dr. Leticia Clas notified.

## 2021-03-25 NOTE — H&P (Signed)
Gastroenterology Consult         Consult Date: 03/25/2021     Subjective:     Chief Complaint: pancreatic cyst    History of Present Illness: Kathryn [name] is a 81 y.o. female who is seen in consultation for pancreatic cyst. 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 Jude - ICD defribrillator/pacemaker    PR UNLISTED PROCEDURE BREAST      Left Mastectomy      Family History   Problem Relation Age of Onset    Diabetes Mother     Hypertension Mother     Diabetes Father     Hypertension Father     Stroke Father      Social History     Tobacco Use    Smoking status: Former     Packs/day: 0.25     Years: 0.50     Pack years: 0.13     Types: Cigarettes     Quit date: 07/01/1967     Years since quitting: 53.7    Smokeless tobacco: Never   Substance Use Topics    Alcohol use: No      Allergies   Allergen Reactions    Pcn [Penicillins] Itching    Sulfa (Sulfonamide Antibiotics) Hives     Current Facility-Administered Medications   Medication Dose Route Frequency    0.9% sodium chloride infusion  25 mL/hr IntraVENous CONTINUOUS        Review of Systems:  A  detailed 10 organ review of systems is obtained with pertinent positives as listed in the History of Present Illness and Past Medical History. All others are negative.    Objective:     Physical Exam:  Visit Vitals  BP 130/79 (BP 1 Location: Right arm, BP Patient Position: Sitting)   Pulse (!) 101   Temp 97.7 ??F (36.5 ??C)   Resp 18   Ht 5\' 5"  (1.651 m)   Wt 64.1 kg (141 lb 5 oz)   SpO2 100%   BMI 23.52 kg/m??      HEENT: Sclerae anicteric.  Cardiovascular: Regular rate and rhythm.  Respiratory:  Comfortable breathing with no accessory muscle use.   GI:  Abdomen nondistended, soft, and nontender.    Neurological:  Gross memory appears intact.  Patient is alert and oriented.       Assessment/Plan:  EUS with FNA

## 2021-04-02 ENCOUNTER — Other Ambulatory Visit: Payer: Self-pay

## 2021-04-02 ENCOUNTER — Ambulatory Visit: Payer: Medicare Other | Attending: Orthopedic Surgery | Admitting: Physical Therapy

## 2021-04-02 ENCOUNTER — Encounter: Payer: Self-pay | Admitting: Physical Therapy

## 2021-04-02 DIAGNOSIS — M79642 Pain in left hand: Secondary | ICD-10-CM | POA: Insufficient documentation

## 2021-04-02 DIAGNOSIS — R600 Localized edema: Secondary | ICD-10-CM | POA: Diagnosis present

## 2021-04-02 DIAGNOSIS — M25622 Stiffness of left elbow, not elsewhere classified: Secondary | ICD-10-CM | POA: Insufficient documentation

## 2021-04-02 DIAGNOSIS — M25512 Pain in left shoulder: Secondary | ICD-10-CM | POA: Insufficient documentation

## 2021-04-02 DIAGNOSIS — M6281 Muscle weakness (generalized): Secondary | ICD-10-CM | POA: Diagnosis present

## 2021-04-02 DIAGNOSIS — M25522 Pain in left elbow: Secondary | ICD-10-CM | POA: Diagnosis present

## 2021-04-02 DIAGNOSIS — M25612 Stiffness of left shoulder, not elsewhere classified: Secondary | ICD-10-CM | POA: Insufficient documentation

## 2021-04-02 NOTE — Patient Instructions (Signed)
Access Code: [email or web address] URL: [email or web address] Date: 04/02/2021 Prepared by: Almyra Free  Exercises Standing Shoulder Abduction AAROM with Dowel - 2 x daily - 7 x weekly - 3 sets - 10 reps - 3-5 sec hold Putty Squeezes - 1 x daily - 7 x weekly - 3 sets - 10 reps

## 2021-04-02 NOTE — Therapy (Signed)
[facility] [place] [address]  [address] [place], [place], [address] Phone: [phone]   Fax:  [phone]  Physical Therapy Evaluation  Patient Details  Name: Mary Fitzpatrick MRN: [id number] Date of Birth: 12/26/1940 Referring Provider (PT): Francena HanlyKevin Supple   Encounter Date: 04/02/2021   PT End of Session - 04/02/21 1018     Visit Number 1    Date for PT Re-Evaluation 05/28/21    Authorization Type MCR    PT Start Time 1018    PT Stop Time 1107    PT Time Calculation (min) 49 min    Activity Tolerance Patient tolerated treatment well    Behavior [facility] for tasks assessed/performed             Past Medical History:  Diagnosis Date   Arthritis    Hyperlipidemia    Hypertension    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    Subjective Assessment - 04/02/21 1019     Subjective Patient had surgery 01/15/21 ORIF for left humeral fracture. She is now limited with lifting her arm OH. She is having an issue with her left hand. It is painful from her elbow to the hand  and the hand is swollen in the morning. She denies pain in her upper arm unless she is doing her exercises. She has intermittent N/T. It is worse from when she goes to bed until she gets it moving in the morning. She wants to work in her garden in the spring and summer. She can't lift anything heavy. Everything is a chore right  now. Can't style her hair.    Pertinent History HTN    Patient Stated Goals restore her ROM and strength    Currently in Pain? No/denies    Pain Score 5     Pain Location Hand    Pain Orientation Left    Pain Descriptors / Indicators Numbness    Pain Type Acute pain    Pain Onset More than a month ago    Pain Frequency Intermittent    Aggravating Factors  end of day, at night and in the morning    Multiple Pain Sites Yes    Pain Score 3    Pain Location Elbow    Pain Orientation Left    Pain Descriptors / Indicators Sore    Pain Type Acute pain    Pain Onset More than a month ago    Pain Frequency Intermittent    Aggravating Factors  WB                [facility] PT Assessment - 04/02/21 0001       Assessment   Medical Diagnosis s/p ORIF left humeral fx    Referring Provider (PT) Francena HanlyKevin Supple    Onset Date/Surgical Date 01/15/21    Hand Dominance Right  Next MD Visit 04/17/21    Prior Therapy Saw PT in [place] 3 times before returning      Precautions   Precautions None      Restrictions   Weight Bearing Restrictions No      Balance Screen   Has the patient fallen in the past 6 months Yes   lost balance after turning around   How many times? 1    Has the patient had a decrease in activity level because of a fear of falling?  No    Is the patient reluctant to leave their home because of a fear of falling?  No      Home Tourist information centre manager residence      Prior Function   Level of Independence Independent    Vocation Retired    Leisure Tax inspector   Posture/Postural Control Postural limitations    Postural Limitations Rounded Shoulders;Forward head;Increased thoracic kyphosis;Posterior pelvic tilt    Posture Comments right scapular winging      ROM / Strength   AROM / PROM / Strength AROM;PROM;Strength      AROM   Overall AROM Comments tightness with left elbow supination; pro WNL; able to reach behind back  ind to lower lumbar    AROM Assessment Site Shoulder;Elbow;Wrist    Right/Left Shoulder Left    Left Shoulder Extension 25 Degrees    Left Shoulder Flexion 105 Degrees    Left Shoulder ABduction 50 Degrees    Left Shoulder Internal Rotation 40 Degrees   at 50 deg ABD   Left Shoulder External Rotation 53 Degrees   at 50 deg ABD   Right/Left Elbow Left    Left Elbow Flexion 152    Left Elbow Extension -5    Right/Left Wrist Left    Left Wrist Extension 65 Degrees    Left Wrist Flexion 48 Degrees      PROM   Overall PROM Comments pain at end range all planes    PROM Assessment Site Shoulder;Elbow;Wrist    Right/Left Shoulder Left    Left Shoulder Extension 50 Degrees    Left Shoulder Flexion 120 Degrees    Left Shoulder ABduction 60 Degrees    Left Shoulder Internal Rotation 45 Degrees    Left Shoulder External Rotation 72 Degrees   at 50 deg ABD   Left Shoulder Horizontal ABduction --   at 50 deg ABD   Right/Left Elbow Left    Left Elbow Flexion 163    Left Elbow Extension 0    Right/Left Wrist Left    Left Wrist Extension 78 Degrees    Left Wrist Flexion 76 Degrees      Strength   Strength Assessment Site Shoulder;Elbow;Wrist;Hand    Right/Left Shoulder Left    Left Shoulder Flexion 2+/5    Left Shoulder Extension 3-/5    Left Shoulder ABduction 2+/5    Left Shoulder Internal Rotation 4-/5    Left Shoulder External Rotation 4+/5    Right/Left Elbow Left    Right/Left Wrist Left    Right/Left hand Right;Left    Right Hand Grip (lbs) 26    Left Hand Grip (lbs) 3      Palpation   Palpation comment tenderness along ant shoulder (incision); tender at left olecrano process (elbow); else unremarkable                        Objective measurements completed  on examination: See above findings.                PT Education - 04/02/21 1225     Education Details Reviewed HEP from previous PT; added AAROM for ABD; added putty squeezes (yellow) and  finger opposition; discussed elevating hand with squeezes to decrease edema; discussed POC    Person(s) Educated Patient    Methods Explanation;Demonstration;Handout    Comprehension Verbalized understanding;Returned demonstration              PT Short Term Goals - 04/02/21 1237       PT SHORT TERM GOAL #1   Title Ind with intial HEP    Time 2    Period Weeks    Status New    Target Date 04/16/21      PT SHORT TERM GOAL #2   Title Decreased pain in the left hand by >=50% at EOD and in the morning.    Time 4    Period Weeks    Status New               PT Long Term Goals - 04/02/21 1239       PT LONG TERM GOAL #1   Title Improved left shoulder functional ROM to allow pt to perform ADLS including dressing, grooming and OH activities with minimal difficulty.    Time 8    Period Weeks    Status New    Target Date 05/28/21      PT LONG TERM GOAL #2   Title Improved left UE strength to 4+/5 to allow patient to peform ADLs including carrying, lifting and gardening.    Time 8    Period Weeks    Status New      PT LONG TERM GOAL #3   Title Pt to demonstrate full left elbow extension and be able to WB on elbow without pain.    Time 8    Period Weeks    Status New      PT LONG TERM GOAL #4   Title Improved left grip strength to at least 10# to improve function of left hand.    Time 8    Period Weeks    Status New      PT LONG TERM GOAL #5   Title Ind with advanced HEP to maintain goals made in PT    Time 8    Period Weeks    Status New                    Plan - 04/02/21 1226     Clinical Impression Statement Patient presents s/p left ORIF for humeral fracture on 01/15/21. [place] visiting family and now has returned home. 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 She also reports pain in the elbow  with WB and significant pain, N/T and swelling into the left hand at the end of day and in the morning. This pain also wakes her from sleep. She has marked weakness in the left shoulder, elbow and grip. Patient reports limitations in all ADLs including dressing, bathing, grooming, lifting and any OH or even shoulder height activities. She will benefit from skilled PT to address these deficits.    Personal Factors and Comorbidities Time since onset of injury/illness/exacerbation;Age    Examination-Activity Limitations Bathing;Carry;Dressing;Hygiene/Grooming;Lift;Sleep;Reach  Overhead    Examination-Participation Restrictions Cleaning;Yard Work    Stability/Clinical Decision Making Stable/Uncomplicated    Optometrist Low    Rehab Potential Excellent    PT Frequency 2x / week    PT Duration 8 weeks    PT Treatment/Interventions ADLs/Self Care Home Management;Aquatic Therapy;Cryotherapy;Electrical Stimulation;Moist Heat;Therapeutic activities;Therapeutic exercise;Neuromuscular re-education;Manual techniques;Patient/family education;Passive range of motion;Dry needling;Taping;Vasopneumatic Device;Joint Manipulations;Spinal Manipulations    PT Next Visit Plan complete FOTO for shoulder (already set up); focus on left shoulder ROM, strength as tolerated for Left UT, elbow and wrist flexibility; grip strength    PT Home Exercise Plan [email or web address]    Consulted and Agree with Plan of Care Patient             Patient will benefit from skilled therapeutic intervention in order to improve the following deficits and impairments:  Decreased range of motion, Impaired UE functional use, Increased muscle spasms, Pain, Decreased activity tolerance, Postural dysfunction, Increased edema, Decreased strength, Impaired flexibility  Visit Diagnosis: Stiffness of left shoulder, not elsewhere classified - Plan: PT plan of care cert/re-cert  Stiffness of left elbow, not elsewhere classified - Plan: PT plan of  care cert/re-cert  Pain in left hand - Plan: PT plan of care cert/re-cert  Pain in left elbow - Plan: PT plan of care cert/re-cert  Acute pain of left shoulder - Plan: PT plan of care cert/re-cert  Localized edema - Plan: PT plan of care cert/re-cert  Muscle weakness (generalized) - Plan: PT plan of care cert/re-cert     Problem List Patient Active Problem List   Diagnosis Date Noted   S/P ORIF (open reduction internal fixation) fracture 01/15/2021   Hyperkalemia 12/20/2020   Primary hyperparathyroidism (HCC) 12/20/2020   Need for immunization against influenza 12/20/2020   Hypertension 07/20/2020   Hyperlipidemia 07/20/2020   Osteoporosis 07/20/2020   Tobacco abuse 07/20/2020   Solon Palm, PT 04/02/2021, 1:04 PM  [facility] [address]  [address] [place], [place], [address] Phone: [phone]   Fax:  [phone]  Name: Mary Fitzpatrick MRN: [id number] Date of Birth: February 25, 1941

## 2021-04-05 ENCOUNTER — Ambulatory Visit: Payer: Medicare Other

## 2021-04-05 ENCOUNTER — Other Ambulatory Visit: Payer: Self-pay

## 2021-04-05 DIAGNOSIS — M6281 Muscle weakness (generalized): Secondary | ICD-10-CM

## 2021-04-05 DIAGNOSIS — M25622 Stiffness of left elbow, not elsewhere classified: Secondary | ICD-10-CM

## 2021-04-05 DIAGNOSIS — M25512 Pain in left shoulder: Secondary | ICD-10-CM

## 2021-04-05 DIAGNOSIS — M79642 Pain in left hand: Secondary | ICD-10-CM

## 2021-04-05 DIAGNOSIS — M25612 Stiffness of left shoulder, not elsewhere classified: Secondary | ICD-10-CM

## 2021-04-05 DIAGNOSIS — M25522 Pain in left elbow: Secondary | ICD-10-CM

## 2021-04-05 DIAGNOSIS — R600 Localized edema: Secondary | ICD-10-CM

## 2021-04-05 NOTE — Patient Instructions (Signed)
Access Code: [email or web address] URL: [email or web address] Date: 04/05/2021 Prepared by: Verta Ellen  Exercises Putty Squeezes - 1 x daily - 7 x weekly - 3 sets - 10 reps Supine Shoulder Flexion Extension AAROM with Dowel - 1 x daily - 7 x weekly - 2 sets - 10 reps Supine Shoulder Scaption with Dowel - 1 x daily - 7 x weekly - 2 sets - 10 reps Supine Shoulder External Rotation in 45 Degrees Abduction AAROM with Dowel - 1 x daily - 7 x weekly - 2 sets - 10 reps

## 2021-04-05 NOTE — Therapy (Signed)
[place] [address]  [place], [place], [address] Phone: [phone]   Fax:  [phone]  Physical Therapy Treatment  Patient Details  Name: Mary Fitzpatrick MRN: [id number] Date of Birth: 11/09/1940 Referring Provider (PT): Justice Britain   Encounter Date: 04/05/2021   PT End of Session - 04/05/21 1110     Visit Number 2    Date for PT Re-Evaluation 05/28/21    Authorization Type MCR    PT Start Time 1018    PT Stop Time 1100    PT Time Calculation (min) 42 min    Activity Tolerance Patient tolerated treatment well    Behavior During Therapy [facility] for tasks assessed/performed             Past Medical History:  Diagnosis Date   Arthritis    Hyperlipidemia    Hypertension    Parathyroid abnormality ([place])    nodule on one of the glands    Past Surgical History:  Procedure Laterality Date   BREAST EXCISIONAL BIOPSY Left    BREAST EXCISIONAL BIOPSY Right    GASTRECTOMY     Due to a ruptured bleeding ulcer   LAPAROTOMY     exploratory kink in bowel due to water sking   ORIF HUMERUS FRACTURE Left 01/15/2021   Procedure: OPEN REDUCTION INTERNAL FIXATION (ORIF) PROXIMAL HUMERUS FRACTURE;  Surgeon: Justice Britain, MD;  Location: [facility];  Service: Orthopedics;  Laterality: Left;   TONSILLECTOMY      There were no vitals filed for this visit.   Subjective Assessment - 04/05/21 1024     Subjective Pt reports that her shoulder remains stiff when trying to reach [facility], keeping up with the HEP the best she can.    Pertinent History HTN    Patient Stated Goals restore her ROM and strength    Currently in Pain? No/denies                [facility] PT Assessment - 04/05/21 0001       Observation/Other Assessments   Focus on Therapeutic Outcomes (FOTO)  Shoulder: 50%                           [facility] Adult PT Treatment/Exercise - 04/05/21 0001       Exercises   Exercises Shoulder       Shoulder Exercises: Supine   External Rotation AAROM;Both;10 reps    External Rotation Limitations with cane, elbow supported with towel    Flexion AAROM;Both;10 reps    Flexion Limitations with cane    ABduction AAROM;Right;10 reps    ABduction Limitations with wand      Shoulder Exercises: Therapy Ball   Flexion Both;10 reps    Flexion Limitations with green Pball seated    ABduction Both;10 reps    ABduction Limitations with green Pball      Shoulder Exercises: ROM/Strengthening   UBE (Upper Arm Bike) L1 52min fwd                     PT Education - 04/05/21 1104     Education Details HEP update and review    Person(s) Educated Patient    Methods Explanation;Demonstration;Handout    Comprehension Verbalized understanding;Returned demonstration              PT Short Term Goals - 04/05/21 1128       PT SHORT TERM GOAL #1  Title Ind with intial HEP    Time 2    Period Weeks    Status On-going    Target Date 04/16/21      PT SHORT TERM GOAL #2   Title Decreased pain in the left hand by >=50% at EOD and in the morning.    Time 4    Period Weeks    Status On-going               PT Long Term Goals - 04/05/21 1129       PT LONG TERM GOAL #1   Title Improved left shoulder functional ROM to allow pt to perform ADLS including dressing, grooming and OH activities with minimal difficulty.    Time 8    Period Weeks    Status On-going    Target Date 05/28/21      PT LONG TERM GOAL #2   Title Improved left UE strength to 4+/5 to allow patient to peform ADLs including carrying, lifting and gardening.    Time 8    Period Weeks    Status On-going      PT LONG TERM GOAL #3   Title Pt to demonstrate full left elbow extension and be able to WB on elbow without pain.    Time 8    Period Weeks    Status On-going      PT LONG TERM GOAL #4   Title Improved left grip strength to at least 10# to improve function of left hand.    Time 8    Period Weeks     Status On-going      PT LONG TERM GOAL #5   Title Ind with advanced HEP to maintain goals made in PT    Time 8    Period Weeks    Status New                   Plan - 04/05/21 1111     Clinical Impression Statement Pt very limited with R shoulder functional ROM. Too much compensation with UT and trunk in standing, so I gave her supine wand exercises. She didn't have a lot of pain during the session, just stiffness in her L shoulder. FOTO revealed a lot functional deficits in [facility] reaching and pulling to open up a drawer. With cues and instruction pt did well with the exercises.    Personal Factors and Comorbidities Time since onset of injury/illness/exacerbation;Age    PT Frequency 2x / week    PT Duration 8 weeks    PT Treatment/Interventions ADLs/Self Care Home Management;Aquatic Therapy;Cryotherapy;Electrical Stimulation;Moist Heat;Therapeutic activities;Therapeutic exercise;Neuromuscular re-education;Manual techniques;Patient/family education;Passive range of motion;Dry needling;Taping;Vasopneumatic Device;Joint Manipulations;Spinal Manipulations    PT Next Visit Plan focus on left shoulder ROM, strength as tolerated for Left UT, elbow and wrist flexibility; grip strength    PT Home Exercise Plan [email or web address]    Consulted and Agree with Plan of Care Patient             Patient will benefit from skilled therapeutic intervention in order to improve the following deficits and impairments:  Decreased range of motion, Impaired UE functional use, Increased muscle spasms, Pain, Decreased activity tolerance, Postural dysfunction, Increased edema, Decreased strength, Impaired flexibility  Visit Diagnosis: Stiffness of left shoulder, not elsewhere classified  Stiffness of left elbow, not elsewhere classified  Pain in left hand  Pain in left elbow  Acute pain of left shoulder  Localized edema  Muscle weakness (generalized)  Problem List Patient Active Problem List    Diagnosis Date Noted   S/P ORIF (open reduction internal fixation) fracture 01/15/2021   Hyperkalemia 12/20/2020   Primary hyperparathyroidism ([place]) 12/20/2020   Need for immunization against influenza 12/20/2020   Hypertension 07/20/2020   Hyperlipidemia 07/20/2020   Osteoporosis 07/20/2020   Tobacco abuse 07/20/2020    Artist Pais, PTA 04/05/2021, 11:30 AM  [facility] [address]  [place], [place], [address] Phone: [phone]   Fax:  [phone]  Name: Mary Fitzpatrick MRN: [id number] Date of Birth: 10-Feb-1941

## 2021-04-09 ENCOUNTER — Encounter: Payer: Self-pay | Admitting: Physical Therapy

## 2021-04-09 ENCOUNTER — Other Ambulatory Visit: Payer: Self-pay

## 2021-04-09 ENCOUNTER — Ambulatory Visit: Payer: Medicare Other | Admitting: Physical Therapy

## 2021-04-09 DIAGNOSIS — M25612 Stiffness of left shoulder, not elsewhere classified: Secondary | ICD-10-CM

## 2021-04-09 DIAGNOSIS — R600 Localized edema: Secondary | ICD-10-CM

## 2021-04-09 DIAGNOSIS — M25622 Stiffness of left elbow, not elsewhere classified: Secondary | ICD-10-CM

## 2021-04-09 DIAGNOSIS — M6281 Muscle weakness (generalized): Secondary | ICD-10-CM

## 2021-04-09 DIAGNOSIS — M25522 Pain in left elbow: Secondary | ICD-10-CM

## 2021-04-09 DIAGNOSIS — M25512 Pain in left shoulder: Secondary | ICD-10-CM

## 2021-04-09 DIAGNOSIS — M79642 Pain in left hand: Secondary | ICD-10-CM

## 2021-04-09 NOTE — Patient Instructions (Addendum)
Access Code: [email or web address] URL: [email or web address] Date: 04/09/2021 Prepared by: Almyra Free  Exercises Putty Squeezes - 1 x daily - 7 x weekly - 3 sets - 10 reps Supine Shoulder Flexion Extension AAROM with Dowel - 1 x daily - 7 x weekly - 2 sets - 10 reps Supine Shoulder Scaption with Dowel - 1 x daily - 7 x weekly - 2 sets - 10 reps Supine Shoulder External Rotation in 45 Degrees Abduction AAROM with Dowel - 1 x daily - 7 x weekly - 2 sets - 10 reps Seated Shoulder Flexion Slide at Table Top with Forearm in Neutral - 1 x daily - 7 x weekly - 2 sets - 10 reps Shoulder Flexion Wall Slide with Towel (Mirrored) - 2 x daily - 7 x weekly - 2 sets - 10 reps Sleeper Stretch - 2 x daily - 7 x weekly - 3 reps - 1 sets - 30-60 sec hold

## 2021-04-09 NOTE — Therapy (Signed)
[facility] [address]  [place], [place], [address] Phone: [phone]   Fax:  [phone]  Physical Therapy Treatment  Patient Details  Name: Mary Fitzpatrick MRN: [id number] Date of Birth: 05/29/1940 Referring Provider (PT): Justice Britain   Encounter Date: 04/09/2021   PT End of Session - 04/09/21 1016     Visit Number 3    Date for PT Re-Evaluation 05/28/21    Authorization Type MCR    PT Start Time 1015    PT Stop Time 1107    PT Time Calculation (min) 52 min    Activity Tolerance Patient tolerated treatment well    Behavior During Therapy [facility] for tasks assessed/performed             Past Medical History:  Diagnosis Date   Arthritis    Hyperlipidemia    Hypertension    Parathyroid abnormality ([place])    nodule on one of the glands    Past Surgical History:  Procedure Laterality Date   BREAST EXCISIONAL BIOPSY Left    BREAST EXCISIONAL BIOPSY Right    GASTRECTOMY     Due to a ruptured bleeding ulcer   LAPAROTOMY     exploratory kink in bowel due to water sking   ORIF HUMERUS FRACTURE Left 01/15/2021   Procedure: OPEN REDUCTION INTERNAL FIXATION (ORIF) PROXIMAL HUMERUS FRACTURE;  Surgeon: Justice Britain, MD;  Location: [facility];  Service: Orthopedics;  Laterality: Left;   TONSILLECTOMY      There were no vitals filed for this visit.   Subjective Assessment - 04/09/21 1017     Subjective The putty really helps the hand. I use it every morning to get it going.    Pertinent History HTN    Patient Stated Goals restore her ROM and strength    Currently in Pain? No/denies                               [facility] Adult PT Treatment/Exercise - 04/09/21 0001       Shoulder Exercises: Supine   Flexion AAROM;Both;5 reps    Flexion Limitations with cane      Shoulder Exercises: Prone   Other Prone Exercises tried flexion roll outs on [place] sitting on knees but moved to sitting       Shoulder Exercises: Pulleys   Flexion 3 minutes    Scaption 2 minutes      Shoulder Exercises: ROM/Strengthening   Other ROM/Strengthening Exercises wall ladder x 10 flexion   8th from top     Shoulder Exercises: Stretch   Internal Rotation Stretch 20 seconds    Internal Rotation Stretch Limitations 3 reps Sleeper stretch in SDLY    Wall Stretch - Flexion 5 reps;10 seconds    Wall Stretch - Flexion Limitations with towel and tactile cue to left UT with right hand to keep shoulder from elevating; also VCs for engaging lower traps    Table Stretch - Flexion 5 reps;10 seconds    Table Stretch -Flexion Limitations 2 sets on corner of mat table with arms on peanut; also did table stretch using towel x 5; attempted at counter height which was too high    Table Stretch - External Rotation 1 rep;20 seconds    Table Stretch - External Rotation Limitations better stretch with cane per pt      Manual Therapy   Manual Therapy Passive ROM;Myofascial release  Myofascial Release TPR to left subscap with active IR/ER x 10    Passive ROM into flexion, ER and IR                     PT Education - 04/09/21 1210     Education Details HEP update    Person(s) Educated Patient    Methods Explanation;Demonstration;Handout    Comprehension Verbalized understanding;Returned demonstration              PT Short Term Goals - 04/09/21 1220       PT SHORT TERM GOAL #1   Title Ind with intial HEP    Status Partially Met               PT Long Term Goals - 04/05/21 1129       PT LONG TERM GOAL #1   Title Improved left shoulder functional ROM to allow pt to perform ADLS including dressing, grooming and OH activities with minimal difficulty.    Time 8    Period Weeks    Status On-going    Target Date 05/28/21      PT LONG TERM GOAL #2   Title Improved left UE strength to 4+/5 to allow patient to peform ADLs including carrying, lifting and gardening.    Time 8    Period Weeks     Status On-going      PT LONG TERM GOAL #3   Title Pt to demonstrate full left elbow extension and be able to WB on elbow without pain.    Time 8    Period Weeks    Status On-going      PT LONG TERM GOAL #4   Title Improved left grip strength to at least 10# to improve function of left hand.    Time 8    Period Weeks    Status On-going      PT LONG TERM GOAL #5   Title Ind with advanced HEP to maintain goals made in PT    Time 8    Period Weeks    Status New                   Plan - 04/09/21 1215     Clinical Impression Statement Patient very tight in left subscapularis. Good response to sleeper stretch. We worked on flexion mainly today. She is able to get a good stretch with good form doing wall slide with right hand on left UT to prevent elevation. Tolerates stretches well and encouraged to do as much as tolerated. Still experiencing nerve irritation into left forearm and hand. Would benefit from IASTM/manual to post upper arm and along nerve distribution next visit.    PT Frequency 2x / week    PT Duration 8 weeks    PT Treatment/Interventions ADLs/Self Care Home Management;Aquatic Therapy;Cryotherapy;Electrical Stimulation;Moist Heat;Therapeutic activities;Therapeutic exercise;Neuromuscular re-education;Manual techniques;Patient/family education;Passive range of motion;Dry needling;Taping;Vasopneumatic Device;Joint Manipulations;Spinal Manipulations    PT Next Visit Plan Please print out sleeper stretch for pt (already in [place]); focus on left shoulder ROM, manual to left UE, strength as tolerated for Left UT, elbow and wrist flexibility; grip strength    PT Home Exercise Plan [email or web address]    Consulted and Agree with Plan of Care Patient             Patient will benefit from skilled therapeutic intervention in order to improve the following deficits and impairments:  Decreased range of motion, Impaired UE functional use, Increased muscle  spasms, Pain, Decreased  activity tolerance, Postural dysfunction, Increased edema, Decreased strength, Impaired flexibility  Visit Diagnosis: Stiffness of left shoulder, not elsewhere classified  Stiffness of left elbow, not elsewhere classified  Pain in left hand  Pain in left elbow  Acute pain of left shoulder  Localized edema  Muscle weakness (generalized)     Problem List Patient Active Problem List   Diagnosis Date Noted   S/P ORIF (open reduction internal fixation) fracture 01/15/2021   Hyperkalemia 12/20/2020   Primary hyperparathyroidism ([place]) 12/20/2020   Need for immunization against influenza 12/20/2020   Hypertension 07/20/2020   Hyperlipidemia 07/20/2020   Osteoporosis 07/20/2020   Tobacco abuse 07/20/2020   Madelyn Flavors, PT 04/09/2021, 12:25 PM  [facility] [address]  [place], [place], [address] Phone: [phone]   Fax:  [phone]  Name: Christabell Loseke MRN: [id number] Date of Birth: 22-Jan-1941

## 2021-04-12 ENCOUNTER — Encounter: Payer: Self-pay | Admitting: Physical Therapy

## 2021-04-12 ENCOUNTER — Ambulatory Visit: Payer: Medicare Other | Admitting: Physical Therapy

## 2021-04-12 ENCOUNTER — Other Ambulatory Visit: Payer: Self-pay

## 2021-04-12 DIAGNOSIS — M25612 Stiffness of left shoulder, not elsewhere classified: Secondary | ICD-10-CM | POA: Diagnosis not present

## 2021-04-12 DIAGNOSIS — M79642 Pain in left hand: Secondary | ICD-10-CM

## 2021-04-12 DIAGNOSIS — R600 Localized edema: Secondary | ICD-10-CM

## 2021-04-12 DIAGNOSIS — M25522 Pain in left elbow: Secondary | ICD-10-CM

## 2021-04-12 DIAGNOSIS — M25622 Stiffness of left elbow, not elsewhere classified: Secondary | ICD-10-CM

## 2021-04-12 DIAGNOSIS — M6281 Muscle weakness (generalized): Secondary | ICD-10-CM

## 2021-04-12 NOTE — Therapy (Signed)
[facility] [address]  [place], [place], [address] Phone: [phone]   Fax:  [phone]  Physical Therapy Treatment  Patient Details  Name: Mary Fitzpatrick MRN: [id number] Date of Birth: [birth year] Referring Provider (PT): Justice Britain   Encounter Date: 04/12/2021   PT End of Session - 04/12/21 1016     Visit Number 4    Date for PT Re-Evaluation 05/28/21    Authorization Type MCR    PT Start Time 1016    PT Stop Time 1058    PT Time Calculation (min) 42 min    Activity Tolerance Patient tolerated treatment well    Behavior During Therapy [facility] for tasks assessed/performed             Past Medical History:  Diagnosis Date   Arthritis    Hyperlipidemia    Hypertension    Parathyroid abnormality ([place])    nodule on one of the glands    Past Surgical History:  Procedure Laterality Date   BREAST EXCISIONAL BIOPSY Left    BREAST EXCISIONAL BIOPSY Right    GASTRECTOMY     Due to a ruptured bleeding ulcer   LAPAROTOMY     exploratory kink in bowel due to water sking   ORIF HUMERUS FRACTURE Left 01/15/2021   Procedure: OPEN REDUCTION INTERNAL FIXATION (ORIF) PROXIMAL HUMERUS FRACTURE;  Surgeon: Justice Britain, MD;  Location: [facility];  Service: Orthopedics;  Laterality: Left;   TONSILLECTOMY      There were no vitals filed for this visit.   Subjective Assessment - 04/12/21 1019     Subjective Pt. reports no pain today.  Getting tired of it overall.    Pertinent History HTN    Patient Stated Goals restore her ROM and strength    Currently in Pain? No/denies                [facility] PT Assessment - 04/12/21 0001       Assessment   Medical Diagnosis s/p ORIF left humeral fx                           [facility] Adult PT Treatment/Exercise - 04/12/21 0001       Exercises   Exercises Shoulder      Shoulder Exercises: Seated   Other Seated Exercises large AAROM shoulder circles  holding swiffer 2 x 10 each direction    Other Seated Exercises table slides into flexion x 10      Shoulder Exercises: Pulleys   Flexion 3 minutes    Scaption 2 minutes      Manual Therapy   Manual Therapy Passive ROM;Soft tissue mobilization;Joint mobilization    Manual therapy comments to LUE to improve ROM    Joint Mobilization gentle mobs to [facility] joint all directions    Soft tissue mobilization IASTM with s/s tools and STM to L forearm flexors/extensions    Passive ROM into flexion, ER and IR                       PT Short Term Goals - 04/09/21 1220       PT SHORT TERM GOAL #1   Title Ind with intial HEP    Status Partially Met               PT Long Term Goals - 04/05/21 1129       PT LONG  TERM GOAL #1   Title Improved left shoulder functional ROM to allow pt to perform ADLS including dressing, grooming and OH activities with minimal difficulty.    Time 8    Period Weeks    Status On-going    Target Date 05/28/21      PT LONG TERM GOAL #2   Title Improved left UE strength to 4+/5 to allow patient to peform ADLs including carrying, lifting and gardening.    Time 8    Period Weeks    Status On-going      PT LONG TERM GOAL #3   Title Pt to demonstrate full left elbow extension and be able to WB on elbow without pain.    Time 8    Period Weeks    Status On-going      PT LONG TERM GOAL #4   Title Improved left grip strength to at least 10# to improve function of left hand.    Time 8    Period Weeks    Status On-going      PT LONG TERM GOAL #5   Title Ind with advanced HEP to maintain goals made in PT    Time 8    Period Weeks    Status New                   Plan - 04/12/21 1202     Clinical Impression Statement Pt. reporting pain in L wrist and elbow especially at night, in addition to tightness in L shoulder.  Tolerated large circles with swiffer, reporting it felt good to be able to move shoulder in large circles.  Focused on  IASTM to L forearm, afterwhich reported decreased tightness in wrist, and manual therapy to L shoulder to improve ROM.  Tolerated well.  Will benefit from continued skilled therapy.    PT Frequency 2x / week    PT Duration 8 weeks    PT Treatment/Interventions ADLs/Self Care Home Management;Aquatic Therapy;Cryotherapy;Electrical Stimulation;Moist Heat;Therapeutic activities;Therapeutic exercise;Neuromuscular re-education;Manual techniques;Patient/family education;Passive range of motion;Dry needling;Taping;Vasopneumatic Device;Joint Manipulations;Spinal Manipulations    PT Next Visit Plan Please print out sleeper stretch for pt (already in [place]); focus on left shoulder ROM, manual to left UE, strength as tolerated for Left UT, elbow and wrist flexibility; grip strength    PT Home Exercise Plan [email or web address]    Consulted and Agree with Plan of Care Patient             Patient will benefit from skilled therapeutic intervention in order to improve the following deficits and impairments:  Decreased range of motion, Impaired UE functional use, Increased muscle spasms, Pain, Decreased activity tolerance, Postural dysfunction, Increased edema, Decreased strength, Impaired flexibility  Visit Diagnosis: Stiffness of left shoulder, not elsewhere classified  Stiffness of left elbow, not elsewhere classified  Pain in left hand  Pain in left elbow  Localized edema  Muscle weakness (generalized)     Problem List Patient Active Problem List   Diagnosis Date Noted   S/P ORIF (open reduction internal fixation) fracture 01/15/2021   Hyperkalemia 12/20/2020   Primary hyperparathyroidism ([place]) 12/20/2020   Need for immunization against influenza 12/20/2020   Hypertension 07/20/2020   Hyperlipidemia 07/20/2020   Osteoporosis 07/20/2020   Tobacco abuse 07/20/2020    Rennie Natter, PT, DPT  04/12/2021, 12:06 PM  [facility] [address]  [place], [place], [address] Phone: [phone]   Fax:  [phone]  Name: Mary  Fitzpatrick MRN: [id number] Date of Birth: 1940/06/19

## 2021-04-16 ENCOUNTER — Other Ambulatory Visit: Payer: Self-pay

## 2021-04-16 ENCOUNTER — Ambulatory Visit: Payer: Medicare Other

## 2021-04-16 DIAGNOSIS — M25612 Stiffness of left shoulder, not elsewhere classified: Secondary | ICD-10-CM | POA: Diagnosis not present

## 2021-04-16 DIAGNOSIS — M25512 Pain in left shoulder: Secondary | ICD-10-CM

## 2021-04-16 DIAGNOSIS — M6281 Muscle weakness (generalized): Secondary | ICD-10-CM

## 2021-04-16 DIAGNOSIS — M25622 Stiffness of left elbow, not elsewhere classified: Secondary | ICD-10-CM

## 2021-04-16 DIAGNOSIS — R600 Localized edema: Secondary | ICD-10-CM

## 2021-04-16 DIAGNOSIS — M79642 Pain in left hand: Secondary | ICD-10-CM

## 2021-04-16 DIAGNOSIS — M25522 Pain in left elbow: Secondary | ICD-10-CM

## 2021-04-16 NOTE — Therapy (Signed)
[facility] [address]  [place], [place], [address] Phone: [phone]   Fax:  [phone]  Physical Therapy Treatment  Patient Details  Name: Mary Fitzpatrick MRN: [id number] Date of Birth: 07-Nov-1940 Referring Provider (PT): Justice Britain   Encounter Date: 04/16/2021   PT End of Session - 04/16/21 1145     Visit Number 5    Date for PT Re-Evaluation 05/28/21    Authorization Type MCR    PT Start Time 1019    PT Stop Time 1100    PT Time Calculation (min) 41 min    Activity Tolerance Patient tolerated treatment well    Behavior During Therapy [facility] for tasks assessed/performed             Past Medical History:  Diagnosis Date   Arthritis    Hyperlipidemia    Hypertension    Parathyroid abnormality ([place])    nodule on one of the glands    Past Surgical History:  Procedure Laterality Date   BREAST EXCISIONAL BIOPSY Left    BREAST EXCISIONAL BIOPSY Right    GASTRECTOMY     Due to a ruptured bleeding ulcer   LAPAROTOMY     exploratory kink in bowel due to water sking   ORIF HUMERUS FRACTURE Left 01/15/2021   Procedure: OPEN REDUCTION INTERNAL FIXATION (ORIF) PROXIMAL HUMERUS FRACTURE;  Surgeon: Justice Britain, MD;  Location: [facility];  Service: Orthopedics;  Laterality: Left;   TONSILLECTOMY      There were no vitals filed for this visit.   Subjective Assessment - 04/16/21 1023     Subjective No pain today just real stiff.    Pertinent History HTN    Patient Stated Goals restore her ROM and strength    Currently in Pain? No/denies                               [facility] Adult PT Treatment/Exercise - 04/16/21 0001       Neck Exercises: Stretches   Upper Trapezius Stretch Left;2 reps;30 seconds      Shoulder Exercises: Prone   Retraction AROM;Left;20 reps    Retraction Limitations prone row    Horizontal ABduction 1 AROM;Left;10 reps      Shoulder Exercises: Standing    Retraction AROM;Both;10 reps    Retraction Limitations with pool noodle      Shoulder Exercises: Pulleys   Flexion 2 minutes    Scaption 2 minutes      Shoulder Exercises: ROM/Strengthening   UBE (Upper Arm Bike) L1.0 21mn fwd/373m back      Manual Therapy   Manual Therapy Soft tissue mobilization;Myofascial release;Passive ROM    Soft tissue mobilization STM to L UT, levator    Myofascial Release TPR to L UT    Passive ROM gentle all directions                       PT Short Term Goals - 04/09/21 1220       PT SHORT TERM GOAL #1   Title Ind with intial HEP    Status Partially Met               PT Long Term Goals - 04/05/21 1129       PT LONG TERM GOAL #1   Title Improved left shoulder functional ROM to allow pt to perform ADLS including dressing, grooming and OH  activities with minimal difficulty.    Time 8    Period Weeks    Status On-going    Target Date 05/28/21      PT LONG TERM GOAL #2   Title Improved left UE strength to 4+/5 to allow patient to peform ADLs including carrying, lifting and gardening.    Time 8    Period Weeks    Status On-going      PT LONG TERM GOAL #3   Title Pt to demonstrate full left elbow extension and be able to WB on elbow without pain.    Time 8    Period Weeks    Status On-going      PT LONG TERM GOAL #4   Title Improved left grip strength to at least 10# to improve function of left hand.    Time 8    Period Weeks    Status On-going      PT LONG TERM GOAL #5   Title Ind with advanced HEP to maintain goals made in PT    Time 8    Period Weeks    Status New                   Plan - 04/16/21 1206     Clinical Impression Statement Pt still showing mod shoulder hiking with OH movements. Interventions focused on decreasing shoulder hike and increasing posterior shoulder muscle engagement. She sits in a fwd head and shoulder position constantly. After the manual and scap stab exercises she was able  to do OH pulleys to 90 deg w/o shoulder hike.    Personal Factors and Comorbidities Time since onset of injury/illness/exacerbation;Age    PT Frequency 2x / week    PT Duration 8 weeks    PT Treatment/Interventions ADLs/Self Care Home Management;Aquatic Therapy;Cryotherapy;Electrical Stimulation;Moist Heat;Therapeutic activities;Therapeutic exercise;Neuromuscular re-education;Manual techniques;Patient/family education;Passive range of motion;Dry needling;Taping;Vasopneumatic Device;Joint Manipulations;Spinal Manipulations    PT Next Visit Plan Please print out sleeper stretch for pt (already in [place]); focus on left shoulder ROM, manual to left UE, strength as tolerated for Left UT, elbow and wrist flexibility; grip strength    PT Home Exercise Plan [email or web address]    Consulted and Agree with Plan of Care Patient             Patient will benefit from skilled therapeutic intervention in order to improve the following deficits and impairments:  Decreased range of motion, Impaired UE functional use, Increased muscle spasms, Pain, Decreased activity tolerance, Postural dysfunction, Increased edema, Decreased strength, Impaired flexibility  Visit Diagnosis: Stiffness of left shoulder, not elsewhere classified  Stiffness of left elbow, not elsewhere classified  Pain in left hand  Pain in left elbow  Localized edema  Muscle weakness (generalized)  Acute pain of left shoulder     Problem List Patient Active Problem List   Diagnosis Date Noted   S/P ORIF (open reduction internal fixation) fracture 01/15/2021   Hyperkalemia 12/20/2020   Primary hyperparathyroidism ([place]) 12/20/2020   Need for immunization against influenza 12/20/2020   Hypertension 07/20/2020   Hyperlipidemia 07/20/2020   Osteoporosis 07/20/2020   Tobacco abuse 07/20/2020    Artist Pais, PTA 04/16/2021, 12:08 PM  [facility] [address]  [address]  [place], [place], [address] Phone: [phone]   Fax:  [phone]  Name: Mary Fitzpatrick MRN: [id number] Date of Birth: Jun 13, 1940

## 2021-04-19 ENCOUNTER — Ambulatory Visit: Payer: Medicare Other | Attending: Orthopedic Surgery | Admitting: Physical Therapy

## 2021-04-19 ENCOUNTER — Encounter: Payer: Self-pay | Admitting: Physical Therapy

## 2021-04-19 ENCOUNTER — Other Ambulatory Visit: Payer: Self-pay

## 2021-04-19 DIAGNOSIS — M25622 Stiffness of left elbow, not elsewhere classified: Secondary | ICD-10-CM | POA: Diagnosis present

## 2021-04-19 DIAGNOSIS — M25612 Stiffness of left shoulder, not elsewhere classified: Secondary | ICD-10-CM

## 2021-04-19 DIAGNOSIS — M25512 Pain in left shoulder: Secondary | ICD-10-CM | POA: Diagnosis present

## 2021-04-19 DIAGNOSIS — M6281 Muscle weakness (generalized): Secondary | ICD-10-CM | POA: Diagnosis present

## 2021-04-19 DIAGNOSIS — M79642 Pain in left hand: Secondary | ICD-10-CM

## 2021-04-19 DIAGNOSIS — M25522 Pain in left elbow: Secondary | ICD-10-CM | POA: Diagnosis present

## 2021-04-19 DIAGNOSIS — R600 Localized edema: Secondary | ICD-10-CM

## 2021-04-19 NOTE — Patient Instructions (Signed)
Access Code: [email or web address] URL: [email or web address] Date: 04/19/2021 Prepared by: Harrie Foreman  Exercises Putty Squeezes - 1 x daily - 7 x weekly - 3 sets - 10 reps Supine Shoulder Flexion Extension AAROM with Dowel - 1 x daily - 7 x weekly - 2 sets - 10 reps Supine Shoulder Scaption with Dowel - 1 x daily - 7 x weekly - 2 sets - 10 reps Supine Shoulder External Rotation in 45 Degrees Abduction AAROM with Dowel - 1 x daily - 7 x weekly - 2 sets - 10 reps Seated Shoulder Flexion Slide at Table Top with Forearm in Neutral - 1 x daily - 7 x weekly - 2 sets - 10 reps Shoulder Flexion Wall Slide with Towel (Mirrored) - 2 x daily - 7 x weekly - 2 sets - 10 reps Sleeper Stretch - 2 x daily - 7 x weekly - 1 sets - 3 reps - 30-60 sec hold Supine Shoulder Flexion Extension AAROM with Dowel - 1 x daily - 7 x weekly - 3 sets - 10 reps Sidelying Shoulder External Rotation - 1 x daily - 7 x weekly - 3 sets - 10 reps Prone Shoulder Extension - Single Arm - 1 x daily - 7 x weekly - 3 sets - 10 reps Prone Shoulder Row - 1 x daily - 7 x weekly - 3 sets - 10 reps Supine Shoulder Press - 1 x daily - 7 x weekly - 3 sets - 10 reps

## 2021-04-19 NOTE — Therapy (Signed)
[facility] [address]  [place], [place], [address] Phone: [phone]   Fax:  [phone]  Physical Therapy Treatment  Patient Details  Name: Mary Fitzpatrick MRN: [id number] Date of Birth: 06-08-1940 Referring Provider (PT): Justice Britain   Encounter Date: 04/19/2021   PT End of Session - 04/19/21 1008     Visit Number 6    Date for PT Re-Evaluation 05/28/21    Authorization Type MCR    Progress Note Due on Visit 10    PT Start Time 8841    PT Stop Time 1101    PT Time Calculation (min) 46 min    Activity Tolerance Patient tolerated treatment well    Behavior During Therapy [facility] for tasks assessed/performed             Past Medical History:  Diagnosis Date   Arthritis    Hyperlipidemia    Hypertension    Parathyroid abnormality ([place])    nodule on one of the glands    Past Surgical History:  Procedure Laterality Date   BREAST EXCISIONAL BIOPSY Left    BREAST EXCISIONAL BIOPSY Right    GASTRECTOMY     Due to a ruptured bleeding ulcer   LAPAROTOMY     exploratory kink in bowel due to water sking   ORIF HUMERUS FRACTURE Left 01/15/2021   Procedure: OPEN REDUCTION INTERNAL FIXATION (ORIF) PROXIMAL HUMERUS FRACTURE;  Surgeon: Justice Britain, MD;  Location: [facility];  Service: Orthopedics;  Laterality: Left;   TONSILLECTOMY      There were no vitals filed for this visit.   Subjective Assessment - 04/19/21 1019     Subjective Patient saw orthopedist this week who told her healing well but would take up to a year to recover.  To continue PT.  She reports shoulder is very stiff today.    Pertinent History HTN    Patient Stated Goals restore her ROM and strength    Currently in Pain? No/denies                               [facility] Adult PT Treatment/Exercise - 04/19/21 0001       Shoulder Exercises: Supine   Protraction Strengthening;Both;20 reps    Flexion AAROM;Both;20 reps     Flexion Limitations cues to keep elbows straight with return, more challenging, with cane      Shoulder Exercises: Prone   Retraction AROM;Left;20 reps    Retraction Limitations prone row    Extension AROM;Left;20 reps    Horizontal ABduction 1 AROM;Left;20 reps      Shoulder Exercises: Sidelying   External Rotation Strengthening;20 reps      Shoulder Exercises: ROM/Strengthening   UBE (Upper Arm Bike) L1.0 45mn fwd/386m back      Manual Therapy   Manual Therapy Soft tissue mobilization;Myofascial release    Soft tissue mobilization STM to L UT, levator    Myofascial Release TPR to L UT                     PT Education - 04/19/21 1105     Education Details HEP update Access Code: [email or web address]    Person(s) Educated Patient    Methods Explanation;Demonstration;Handout    Comprehension Verbalized understanding;Returned demonstration              PT Short Term Goals - 04/09/21 1220  PT SHORT TERM GOAL #1   Title Ind with intial HEP    Status Partially Met               PT Long Term Goals - 04/05/21 1129       PT LONG TERM GOAL #1   Title Improved left shoulder functional ROM to allow pt to perform ADLS including dressing, grooming and OH activities with minimal difficulty.    Time 8    Period Weeks    Status On-going    Target Date 05/28/21      PT LONG TERM GOAL #2   Title Improved left UE strength to 4+/5 to allow patient to peform ADLs including carrying, lifting and gardening.    Time 8    Period Weeks    Status On-going      PT LONG TERM GOAL #3   Title Pt to demonstrate full left elbow extension and be able to WB on elbow without pain.    Time 8    Period Weeks    Status On-going      PT LONG TERM GOAL #4   Title Improved left grip strength to at least 10# to improve function of left hand.    Time 8    Period Weeks    Status On-going      PT LONG TERM GOAL #5   Title Ind with advanced HEP to maintain goals made in PT    Time  8    Period Weeks    Status New                   Plan - 04/19/21 1113     Clinical Impression Statement Focused today on continuing shoulder strengthening in prone, supine and sidelying in order to decrease shoulder hike and improve posterior shoulder strength.  Corrected flexion with dowel in supine to keep arms straight to increase muscle activation.  Able to complete all exercises today without pain.   Manual at end of session to decrease spasm in L levator/UT.  She would benefit from continued skilled therapy.    Personal Factors and Comorbidities Time since onset of injury/illness/exacerbation;Age    PT Frequency 2x / week    PT Duration 8 weeks    PT Treatment/Interventions ADLs/Self Care Home Management;Aquatic Therapy;Cryotherapy;Electrical Stimulation;Moist Heat;Therapeutic activities;Therapeutic exercise;Neuromuscular re-education;Manual techniques;Patient/family education;Passive range of motion;Dry needling;Taping;Vasopneumatic Device;Joint Manipulations;Spinal Manipulations    PT Next Visit Plan Please print out sleeper stretch for pt (already in [place]); focus on left shoulder ROM, manual to left UE, strength as tolerated for Left UT, elbow and wrist flexibility; grip strength    PT Home Exercise Plan [email or web address]    Consulted and Agree with Plan of Care Patient             Patient will benefit from skilled therapeutic intervention in order to improve the following deficits and impairments:  Decreased range of motion, Impaired UE functional use, Increased muscle spasms, Pain, Decreased activity tolerance, Postural dysfunction, Increased edema, Decreased strength, Impaired flexibility  Visit Diagnosis: Stiffness of left shoulder, not elsewhere classified  Stiffness of left elbow, not elsewhere classified  Acute pain of left shoulder  Localized edema  Muscle weakness (generalized)  Pain in left hand  Pain in left elbow     Problem List Patient Active  Problem List   Diagnosis Date Noted   S/P ORIF (open reduction internal fixation) fracture 01/15/2021   Hyperkalemia 12/20/2020   Primary hyperparathyroidism ([place]) 12/20/2020   Need  for immunization against influenza 12/20/2020   Hypertension 07/20/2020   Hyperlipidemia 07/20/2020   Osteoporosis 07/20/2020   Tobacco abuse 07/20/2020    Rennie Natter, PT, DPT  04/19/2021, 11:18 AM  [facility] [place], [place], [address] Phone: [phone]   Fax:  [phone]  Name: Caris Cerveny MRN: [id number] Date of Birth: [birth year]

## 2021-04-23 ENCOUNTER — Ambulatory Visit: Payer: Medicare Other | Admitting: Physical Therapy

## 2021-04-23 ENCOUNTER — Other Ambulatory Visit: Payer: Self-pay

## 2021-04-23 ENCOUNTER — Encounter: Payer: Self-pay | Admitting: Physical Therapy

## 2021-04-23 DIAGNOSIS — M25612 Stiffness of left shoulder, not elsewhere classified: Secondary | ICD-10-CM | POA: Diagnosis not present

## 2021-04-23 DIAGNOSIS — M25512 Pain in left shoulder: Secondary | ICD-10-CM

## 2021-04-23 DIAGNOSIS — M6281 Muscle weakness (generalized): Secondary | ICD-10-CM

## 2021-04-23 DIAGNOSIS — M25622 Stiffness of left elbow, not elsewhere classified: Secondary | ICD-10-CM

## 2021-04-23 DIAGNOSIS — R600 Localized edema: Secondary | ICD-10-CM

## 2021-04-23 NOTE — Therapy (Signed)
[facility] [address]  [place], [place], [address] Phone: [phone]   Fax:  [phone]  Physical Therapy Treatment  Patient Details  Name: Mary Fitzpatrick MRN: [id number] Date of Birth: [birth year] Referring Provider (PT): Justice Britain   Encounter Date: 04/23/2021   PT End of Session - 04/23/21 1021     Visit Number 7    Date for PT Re-Evaluation 05/28/21    Authorization Type MCR    Progress Note Due on Visit 10    PT Start Time 1017    PT Stop Time 1100    PT Time Calculation (min) 43 min    Activity Tolerance Patient tolerated treatment well    Behavior During Therapy [facility] for tasks assessed/performed             Past Medical History:  Diagnosis Date   Arthritis    Hyperlipidemia    Hypertension    Parathyroid abnormality ([place])    nodule on one of the glands    Past Surgical History:  Procedure Laterality Date   BREAST EXCISIONAL BIOPSY Left    BREAST EXCISIONAL BIOPSY Right    GASTRECTOMY     Due to a ruptured bleeding ulcer   LAPAROTOMY     exploratory kink in bowel due to water sking   ORIF HUMERUS FRACTURE Left 01/15/2021   Procedure: OPEN REDUCTION INTERNAL FIXATION (ORIF) PROXIMAL HUMERUS FRACTURE;  Surgeon: Justice Britain, MD;  Location: [facility];  Service: Orthopedics;  Laterality: Left;   TONSILLECTOMY      There were no vitals filed for this visit.   Subjective Assessment - 04/23/21 1020     Subjective Shoulder very stiff in the mornings.  Feels hand grip is improving.    Pertinent History HTN    Patient Stated Goals restore her ROM and strength    Currently in Pain? No/denies                [facility] PT Assessment - 04/23/21 0001       PROM   Left Shoulder Flexion 125 Degrees    Left Shoulder ABduction 80 Degrees    Left Shoulder Internal Rotation 50 Degrees   at 45 deg abducution   Left Shoulder External Rotation 50 Degrees   at 45 deg abduction                           [facility] Adult PT Treatment/Exercise - 04/23/21 0001       Exercises   Exercises Shoulder      Shoulder Exercises: Supine   Flexion AAROM;Both;20 reps    Flexion Limitations with cane    Other Supine Exercises overhead press with cane x 10      Shoulder Exercises: Prone   Retraction AROM;Left;20 reps    Retraction Limitations prone row    Extension AROM;Left;20 reps    Horizontal ABduction 1 AROM;Left;20 reps      Shoulder Exercises: ROM/Strengthening   UBE (Upper Arm Bike) L1.0 51mn fwd/333m back    Ball on Wall small circles 2 x 10 but increased shoulder pain,    Other ROM/Strengthening Exercises wall ladder x 10 flexion      Manual Therapy   Manual Therapy Passive ROM;Soft tissue mobilization    Manual therapy comments to L shoulder to decrease pain and improve ROM    Soft tissue mobilization STM to L pec, UT    Passive ROM gentle all  directions with measurements                       PT Short Term Goals - 04/09/21 1220       PT SHORT TERM GOAL #1   Title Ind with intial HEP    Status Partially Met               PT Long Term Goals - 04/05/21 1129       PT LONG TERM GOAL #1   Title Improved left shoulder functional ROM to allow pt to perform ADLS including dressing, grooming and OH activities with minimal difficulty.    Time 8    Period Weeks    Status On-going    Target Date 05/28/21      PT LONG TERM GOAL #2   Title Improved left UE strength to 4+/5 to allow patient to peform ADLs including carrying, lifting and gardening.    Time 8    Period Weeks    Status On-going      PT LONG TERM GOAL #3   Title Pt to demonstrate full left elbow extension and be able to WB on elbow without pain.    Time 8    Period Weeks    Status On-going      PT LONG TERM GOAL #4   Title Improved left grip strength to at least 10# to improve function of left hand.    Time 8    Period Weeks    Status On-going      PT LONG TERM  GOAL #5   Title Ind with advanced HEP to maintain goals made in PT    Time 8    Period Weeks    Status New                   Plan - 04/23/21 1829     Clinical Impression Statement Pt. is making good progress overall and her L shoulder PROM is improving.   She did report increase pain today when tried small circles with ball on wall for RTC strengthening, but all other exercises able to complete without pain.  She would benefit from continued skilled therapy.    Personal Factors and Comorbidities Time since onset of injury/illness/exacerbation;Age    PT Frequency 2x / week    PT Duration 8 weeks    PT Treatment/Interventions ADLs/Self Care Home Management;Aquatic Therapy;Cryotherapy;Electrical Stimulation;Moist Heat;Therapeutic activities;Therapeutic exercise;Neuromuscular re-education;Manual techniques;Patient/family education;Passive range of motion;Dry needling;Taping;Vasopneumatic Device;Joint Manipulations;Spinal Manipulations    PT Next Visit Plan Please print out sleeper stretch for pt (already in [place]); focus on left shoulder ROM, manual to left UE, strength as tolerated for Left UT, elbow and wrist flexibility; grip strength    PT Home Exercise Plan [email or web address]    Consulted and Agree with Plan of Care Patient             Patient will benefit from skilled therapeutic intervention in order to improve the following deficits and impairments:  Decreased range of motion, Impaired UE functional use, Increased muscle spasms, Pain, Decreased activity tolerance, Postural dysfunction, Increased edema, Decreased strength, Impaired flexibility  Visit Diagnosis: Stiffness of left shoulder, not elsewhere classified  Stiffness of left elbow, not elsewhere classified  Acute pain of left shoulder  Localized edema  Muscle weakness (generalized)     Problem List Patient Active Problem List   Diagnosis Date Noted   S/P ORIF (open reduction internal fixation) fracture  01/15/2021   Hyperkalemia  12/20/2020   Primary hyperparathyroidism ([place]) 12/20/2020   Need for immunization against influenza 12/20/2020   Hypertension 07/20/2020   Hyperlipidemia 07/20/2020   Osteoporosis 07/20/2020   Tobacco abuse 07/20/2020    Rennie Natter, PT, DPT  04/23/2021, 6:34 PM  [facility] [address]  [address] [place] [place], [place], [address] Phone: [phone]   Fax:  [phone]  Name: Patrici Minnis MRN: [id number] Date of Birth: Sep 05, 1940

## 2021-04-26 ENCOUNTER — Encounter: Payer: Self-pay | Admitting: Physical Therapy

## 2021-04-26 ENCOUNTER — Ambulatory Visit: Payer: Medicare Other | Admitting: Physical Therapy

## 2021-04-26 ENCOUNTER — Other Ambulatory Visit: Payer: Self-pay

## 2021-04-26 DIAGNOSIS — M6281 Muscle weakness (generalized): Secondary | ICD-10-CM

## 2021-04-26 DIAGNOSIS — M25612 Stiffness of left shoulder, not elsewhere classified: Secondary | ICD-10-CM | POA: Diagnosis not present

## 2021-04-26 DIAGNOSIS — R600 Localized edema: Secondary | ICD-10-CM

## 2021-04-26 DIAGNOSIS — M25622 Stiffness of left elbow, not elsewhere classified: Secondary | ICD-10-CM

## 2021-04-26 DIAGNOSIS — M25512 Pain in left shoulder: Secondary | ICD-10-CM

## 2021-04-26 NOTE — Therapy (Signed)
[facility] [address]  [place], [place], [address] Phone: [phone]   Fax:  [phone]  Physical Therapy Treatment  Patient Details  Name: Rhya Shan MRN: [id number] Date of Birth: 09/06/1940 Referring Provider (PT): Justice Britain   Encounter Date: 04/26/2021   PT End of Session - 04/26/21 1027     Visit Number 8    Date for PT Re-Evaluation 05/28/21    Authorization Type MCR    Progress Note Due on Visit 10    PT Start Time 1018    PT Stop Time 1100    PT Time Calculation (min) 42 min    Activity Tolerance Patient tolerated treatment well    Behavior During Therapy [facility] for tasks assessed/performed             Past Medical History:  Diagnosis Date   Arthritis    Hyperlipidemia    Hypertension    Parathyroid abnormality ([place])    nodule on one of the glands    Past Surgical History:  Procedure Laterality Date   BREAST EXCISIONAL BIOPSY Left    BREAST EXCISIONAL BIOPSY Right    GASTRECTOMY     Due to a ruptured bleeding ulcer   LAPAROTOMY     exploratory kink in bowel due to water sking   ORIF HUMERUS FRACTURE Left 01/15/2021   Procedure: OPEN REDUCTION INTERNAL FIXATION (ORIF) PROXIMAL HUMERUS FRACTURE;  Surgeon: Justice Britain, MD;  [facility]: [facility];  Service: Orthopedics;  Laterality: Left;   TONSILLECTOMY      There were no vitals filed for this visit.   Subjective Assessment - 04/26/21 1027     Subjective Pt reports no pain only stiffness in shoulder.    Pertinent History HTN    Patient Stated Goals restore her ROM and strength    Currently in Pain? No/denies                               [facility] Adult PT Treatment/Exercise - 04/26/21 0001       Shoulder Exercises: Supine   Protraction Strengthening;Left;20 reps;Weights    Protraction Weight (lbs) 1#    Protraction Limitations tactile cues needed for technique    Flexion AAROM;Both;20 reps    Flexion  Limitations with cane in semi-reclined position    Other Supine Exercises overhead press with cane 2 x 10 in semi-reclined position      Shoulder Exercises: ROM/Strengthening   UBE (Upper Arm Bike) L1.0 x 7 min (51mn fwd/470m back)    Rhythmic Stabilization, Supine 3 x 30 sec    Other ROM/Strengthening Exercises wall ladder x 10 flexion      Manual Therapy   Manual Therapy Soft tissue mobilization    Manual therapy comments to L shoulder to decrease pain    Soft tissue mobilization STM/IASTM with s/s tools to L Biceps                       PT Short Term Goals - 04/09/21 1220       PT SHORT TERM GOAL #1   Title Ind with intial HEP    Status Partially Met               PT Long Term Goals - 04/05/21 1129       PT LONG TERM GOAL #1   Title Improved left shoulder functional ROM to allow pt  to perform ADLS including dressing, grooming and OH activities with minimal difficulty.    Time 8    Period Weeks    Status On-going    Target Date 05/28/21      PT LONG TERM GOAL #2   Title Improved left UE strength to 4+/5 to allow patient to peform ADLs including carrying, lifting and gardening.    Time 8    Period Weeks    Status On-going      PT LONG TERM GOAL #3   Title Pt to demonstrate full left elbow extension and be able to WB on elbow without pain.    Time 8    Period Weeks    Status On-going      PT LONG TERM GOAL #4   Title Improved left grip strength to at least 10# to improve function of left hand.    Time 8    Period Weeks    Status On-going      PT LONG TERM GOAL #5   Title Ind with advanced HEP to maintain goals made in PT    Time 8    Period Weeks    Status New                   Plan - 04/26/21 1233     Clinical Impression Statement Pt. tolerated progression of supine exercises to semi-reclined position without increased pain, but did note more effort needed.  Noted tightnes along lateral delt/biceps today, after STM to region  reported decreased soreness and pain.  Would benefit from continued skilled therapy.    Personal Factors and Comorbidities Time since onset of injury/illness/exacerbation;Age    PT Frequency 2x / week    PT Duration 8 weeks    PT Treatment/Interventions ADLs/Self Care Home Management;Aquatic Therapy;Cryotherapy;Electrical Stimulation;Moist Heat;Therapeutic activities;Therapeutic exercise;Neuromuscular re-education;Manual techniques;Patient/family education;Passive range of motion;Dry needling;Taping;Vasopneumatic Device;Joint Manipulations;Spinal Manipulations    PT Next Visit Plan Please print out sleeper stretch for pt (already in [place]); focus on left shoulder ROM, manual to left UE, strength as tolerated for Left UT, elbow and wrist flexibility; grip strength    PT Home Exercise Plan [email or web address]    Consulted and Agree with Plan of Care Patient             Patient will benefit from skilled therapeutic intervention in order to improve the following deficits and impairments:  Decreased range of motion, Impaired UE functional use, Increased muscle spasms, Pain, Decreased activity tolerance, Postural dysfunction, Increased edema, Decreased strength, Impaired flexibility  Visit Diagnosis: Stiffness of left shoulder, not elsewhere classified  Stiffness of left elbow, not elsewhere classified  Acute pain of left shoulder  Localized edema  Muscle weakness (generalized)     Problem List Patient Active Problem List   Diagnosis Date Noted   S/P ORIF (open reduction internal fixation) fracture 01/15/2021   Hyperkalemia 12/20/2020   Primary hyperparathyroidism ([place]) 12/20/2020   Need for immunization against influenza 12/20/2020   Hypertension 07/20/2020   Hyperlipidemia 07/20/2020   Osteoporosis 07/20/2020   Tobacco abuse 07/20/2020    Rennie Natter, PT, DPT  04/26/2021, 12:34 PM  [facility] [address]  [place], [place], [address] Phone: [phone]   Fax:  [phone]  Name: Taquila Leys MRN: [id number] Date of Birth: Mar 08, 1941

## 2021-05-01 ENCOUNTER — Other Ambulatory Visit: Payer: Self-pay

## 2021-05-01 ENCOUNTER — Ambulatory Visit: Payer: Medicare Other

## 2021-05-01 DIAGNOSIS — R600 Localized edema: Secondary | ICD-10-CM

## 2021-05-01 DIAGNOSIS — M79642 Pain in left hand: Secondary | ICD-10-CM

## 2021-05-01 DIAGNOSIS — M6281 Muscle weakness (generalized): Secondary | ICD-10-CM

## 2021-05-01 DIAGNOSIS — M25522 Pain in left elbow: Secondary | ICD-10-CM

## 2021-05-01 DIAGNOSIS — M25612 Stiffness of left shoulder, not elsewhere classified: Secondary | ICD-10-CM | POA: Diagnosis not present

## 2021-05-01 DIAGNOSIS — M25622 Stiffness of left elbow, not elsewhere classified: Secondary | ICD-10-CM

## 2021-05-01 DIAGNOSIS — M25512 Pain in left shoulder: Secondary | ICD-10-CM

## 2021-05-01 NOTE — Therapy (Signed)
[facility] [address]  [place], [place], [address] Phone: [phone]   Fax:  [phone]  Physical Therapy Treatment  Patient Details  Name: Mary Fitzpatrick MRN: [id number] Date of Birth: [birth year] Referring Provider (PT): Justice Britain   Encounter Date: 05/01/2021   PT End of Session - 05/01/21 1150     Visit Number 9    Date for PT Re-Evaluation 05/28/21    Authorization Type MCR    Progress Note Due on Visit 10    PT Start Time 1102    PT Stop Time 1146    PT Time Calculation (min) 44 min    Activity Tolerance Patient tolerated treatment well    Behavior During Therapy [facility] for tasks assessed/performed             Past Medical History:  Diagnosis Date   Arthritis    Hyperlipidemia    Hypertension    Parathyroid abnormality ([place])    nodule on one of the glands    Past Surgical History:  Procedure Laterality Date   BREAST EXCISIONAL BIOPSY Left    BREAST EXCISIONAL BIOPSY Right    GASTRECTOMY     Due to a ruptured bleeding ulcer   LAPAROTOMY     exploratory kink in bowel due to water sking   ORIF HUMERUS FRACTURE Left 01/15/2021   Procedure: OPEN REDUCTION INTERNAL FIXATION (ORIF) PROXIMAL HUMERUS FRACTURE;  Surgeon: Justice Britain, MD;  Location: [facility];  Service: Orthopedics;  Laterality: Left;   TONSILLECTOMY      There were no vitals filed for this visit.   Subjective Assessment - 05/01/21 1106     Subjective Reporting continued stiffness in the shoulder, got a good workout with vaccum yesterday.    Pertinent History HTN    Patient Stated Goals restore her ROM and strength    Currently in Pain? No/denies                               [facility] Adult PT Treatment/Exercise - 05/01/21 0001       Shoulder Exercises: Standing   External Rotation Strengthening;Left;10 reps;Theraband    Theraband Level (Shoulder External Rotation) Level 1 (Yellow)    Internal Rotation  Strengthening;Left;20 reps;Theraband    Theraband Level (Shoulder Internal Rotation) Level 1 (Yellow)    Row Strengthening;Both;20 reps;Theraband    Theraband Level (Shoulder Row) Level 1 (Yellow)    Retraction Strengthening;Both;20 reps;Theraband    Theraband Level (Shoulder Retraction) Level 1 (Yellow)      Shoulder Exercises: Pulleys   Flexion 2 minutes      Shoulder Exercises: ROM/Strengthening   UBE (Upper Arm Bike) L1.0 x 46mn, raising the level to increse flexion from 4-6 min    Wall Wash 10 reps into flexion                     PT Education - 05/01/21 1149     Education Details HEP update    Person(s) Educated Patient    Methods Explanation;Demonstration;Handout    Comprehension Verbalized understanding;Returned demonstration              PT Short Term Goals - 04/09/21 1220       PT SHORT TERM GOAL #1   Title Ind with intial HEP    Status Partially Met               PT Long  Term Goals - 05/01/21 1151       PT LONG TERM GOAL #1   Title Improved left shoulder functional ROM to allow pt to perform ADLS including dressing, grooming and OH activities with minimal difficulty.    Time 8    Period Weeks    Status On-going   still showing difficulty with raising shoulder to eye level w/o UT substitution   Target Date 05/28/21      PT LONG TERM GOAL #2   Title Improved left UE strength to 4+/5 to allow patient to peform ADLs including carrying, lifting and gardening.    Time 8    Period Weeks    Status On-going      PT LONG TERM GOAL #3   Title Pt to demonstrate full left elbow extension and be able to WB on elbow without pain.    Time 8    Period Weeks    Status On-going      PT LONG TERM GOAL #4   Title Improved left grip strength to at least 10# to improve function of left hand.    Time 8    Period Weeks    Status On-going      PT LONG TERM GOAL #5   Title Ind with advanced HEP to maintain goals made in PT    Time 8    Period Weeks     Status New                   Plan - 05/01/21 1152     Clinical Impression Statement Pt responded well to the treatment today. She prefered standing row/ext/ER/IR with TB versus in prone and sidelying, so we added these to HEP. Cues needed to restore proper scapulohumeral rythym with OH flexion and for full periscap muscle engagement. Pt w/o any complaints post session, continue progressing exercises.    Personal Factors and Comorbidities Time since onset of injury/illness/exacerbation;Age    PT Frequency 2x / week    PT Duration 8 weeks    PT Treatment/Interventions ADLs/Self Care Home Management;Aquatic Therapy;Cryotherapy;Electrical Stimulation;Moist Heat;Therapeutic activities;Therapeutic exercise;Neuromuscular re-education;Manual techniques;Patient/family education;Passive range of motion;Dry needling;Taping;Vasopneumatic Device;Joint Manipulations;Spinal Manipulations    PT Next Visit Plan focus on left shoulder ROM, manual to left UE, strength as tolerated for Left UT, elbow and wrist flexibility; grip strength    PT Home Exercise Plan [email or web address]    Consulted and Agree with Plan of Care Patient             Patient will benefit from skilled therapeutic intervention in order to improve the following deficits and impairments:  Decreased range of motion, Impaired UE functional use, Increased muscle spasms, Pain, Decreased activity tolerance, Postural dysfunction, Increased edema, Decreased strength, Impaired flexibility  Visit Diagnosis: Stiffness of left shoulder, not elsewhere classified  Stiffness of left elbow, not elsewhere classified  Acute pain of left shoulder  Localized edema  Muscle weakness (generalized)  Pain in left hand  Pain in left elbow     Problem List Patient Active Problem List   Diagnosis Date Noted   S/P ORIF (open reduction internal fixation) fracture 01/15/2021   Hyperkalemia 12/20/2020   Primary hyperparathyroidism ([place]) 12/20/2020    Need for immunization against influenza 12/20/2020   Hypertension 07/20/2020   Hyperlipidemia 07/20/2020   Osteoporosis 07/20/2020   Tobacco abuse 07/20/2020    Artist Pais, PTA 05/01/2021, 11:56 AM  [facility] [address]  [address] [place],  [place], [address] Phone: [phone]   Fax:  [phone]  Name: Mary Fitzpatrick MRN: [id number] Date of Birth: 1940-04-30

## 2021-05-01 NOTE — Patient Instructions (Signed)
Access Code: [email or web address] URL: [email or web address] Date: 05/01/2021 Prepared by: Verta Ellen  Exercises Putty Squeezes - 1 x daily - 7 x weekly - 3 sets - 10 reps Supine Shoulder Flexion Extension AAROM with Dowel - 1 x daily - 7 x weekly - 2 sets - 10 reps Supine Shoulder Scaption with Dowel - 1 x daily - 7 x weekly - 2 sets - 10 reps Supine Shoulder External Rotation in 45 Degrees Abduction AAROM with Dowel - 1 x daily - 7 x weekly - 2 sets - 10 reps Seated Shoulder Flexion Slide at Table Top with Forearm in Neutral - 1 x daily - 7 x weekly - 2 sets - 10 reps Shoulder Flexion Wall Slide with Towel (Mirrored) - 2 x daily - 7 x weekly - 2 sets - 10 reps Sleeper Stretch - 2 x daily - 7 x weekly - 1 sets - 3 reps - 30-60 sec hold Supine Shoulder Flexion Extension AAROM with Dowel - 1 x daily - 7 x weekly - 3 sets - 10 reps Supine Shoulder Press - 1 x daily - 7 x weekly - 3 sets - 10 reps Shoulder External Rotation with Anchored Resistance - 1 x daily - 7 x weekly - 3 sets - 10 reps Standing Bilateral Low Shoulder Row with Anchored Resistance - 1 x daily - 7 x weekly - 3 sets - 10 reps Shoulder extension with resistance - Neutral - 1 x daily - 7 x weekly - 3 sets - 10 reps

## 2021-05-03 ENCOUNTER — Other Ambulatory Visit: Payer: Self-pay

## 2021-05-03 ENCOUNTER — Ambulatory Visit: Payer: Medicare Other

## 2021-05-03 DIAGNOSIS — R600 Localized edema: Secondary | ICD-10-CM

## 2021-05-03 DIAGNOSIS — M25622 Stiffness of left elbow, not elsewhere classified: Secondary | ICD-10-CM

## 2021-05-03 DIAGNOSIS — M25512 Pain in left shoulder: Secondary | ICD-10-CM

## 2021-05-03 DIAGNOSIS — M6281 Muscle weakness (generalized): Secondary | ICD-10-CM

## 2021-05-03 DIAGNOSIS — M25612 Stiffness of left shoulder, not elsewhere classified: Secondary | ICD-10-CM

## 2021-05-03 NOTE — Therapy (Addendum)
[facility] [address]  [place], [place], [address] Phone: [phone]   Fax:  [phone]  Physical Therapy Treatment  Progress Note Reporting Period 04/02/2021 to 05/03/2021  See note below for Objective Data and Assessment of Progress/Goals.   Patient is making good progress towards goals and would benefit from continued skilled therapy.    Rennie Natter, PT, DPT    Patient Details  Name: Mary Fitzpatrick MRN: [id number] Date of Birth: [birth year] Referring Provider (PT): Justice Britain   Encounter Date: 05/03/2021   PT End of Session - 05/03/21 1221     Visit Number 10    Date for PT Re-Evaluation 05/28/21    Authorization Type MCR    Progress Note Due on Visit 10    PT Start Time 1017    PT Stop Time 1059    PT Time Calculation (min) 42 min    Activity Tolerance Patient tolerated treatment well    Behavior During Therapy [facility] for tasks assessed/performed             Past Medical History:  Diagnosis Date   Arthritis    Hyperlipidemia    Hypertension    Parathyroid abnormality ([place])    nodule on one of the glands    Past Surgical History:  Procedure Laterality Date   BREAST EXCISIONAL BIOPSY Left    BREAST EXCISIONAL BIOPSY Right    GASTRECTOMY     Due to a ruptured bleeding ulcer   LAPAROTOMY     exploratory kink in bowel due to water sking   ORIF HUMERUS FRACTURE Left 01/15/2021   Procedure: OPEN REDUCTION INTERNAL FIXATION (ORIF) PROXIMAL HUMERUS FRACTURE;  Surgeon: Justice Britain, MD;  [facility]: [facility];  Service: Orthopedics;  Laterality: Left;   TONSILLECTOMY      There were no vitals filed for this visit.   Subjective Assessment - 05/03/21 1019     Subjective "Sore today from doing new workouts, also because of the weather."    Pertinent History HTN    Patient Stated Goals restore her ROM and strength    Currently in Pain? No/denies                [facility] PT Assessment -  05/03/21 0001       Assessment   Medical Diagnosis s/p ORIF left humeral fx    Referring Provider (PT) Justice Britain    Onset Date/Surgical Date 01/15/21                           [facility] Adult PT Treatment/Exercise - 05/03/21 0001       Shoulder Exercises: Supine   Protraction Strengthening;Left;20 reps;Weights    Protraction Weight (lbs) 1, 2    Protraction Limitations tactile cues needed for technique; chest press with serratus punch   1st set- 1lb, 2nd set- 2lb   Flexion AROM;Left;20 reps    Shoulder Flexion Weight (lbs) 1      Shoulder Exercises: Sidelying   ABduction AROM;Strengthening;Left;10 reps;Weights    ABduction Weight (lbs) 1      Shoulder Exercises: ROM/Strengthening   UBE (Upper Arm Bike) L1.5 x 58mn, raising the level to increse flexion from 3-6 min      Manual Therapy   Manual Therapy Soft tissue mobilization    Soft tissue mobilization STM to L UT, LS, upper rhomboids  PT Short Term Goals - 05/03/21 1212       PT SHORT TERM GOAL #1   Title Ind with intial HEP    Time 2    Period Weeks    Status Achieved   2/17   Target Date 04/16/21      PT SHORT TERM GOAL #2   Title Decreased pain in the left hand by >=50% at EOD and in the morning.    Time 4    Period Weeks    Status Achieved   2/17              PT Long Term Goals - 05/03/21 1212       PT LONG TERM GOAL #1   Title Improved left shoulder functional ROM to allow pt to perform ADLS including dressing, grooming and OH activities with minimal difficulty.    Time 8    Period Weeks    Status On-going   still showing difficulty with raising shoulder to eye level and movements away from body w/o UT substitution   Target Date 05/28/21      PT LONG TERM GOAL #2   Title Improved left UE strength to 4+/5 to allow patient to peform ADLs including carrying, lifting and gardening.    Time 8    Period Weeks    Status On-going      PT LONG TERM  GOAL #3   Title Pt to demonstrate full left elbow extension and be able to WB on elbow without pain.    Time 8    Period Weeks    Status On-going      PT LONG TERM GOAL #4   Title Improved left grip strength to at least 10# to improve function of left hand.    Time 8    Period Weeks    Status On-going      PT LONG TERM GOAL #5   Title Ind with advanced HEP to maintain goals made in PT    Time 8    Period Weeks    Status Partially Met                   Plan - 05/03/21 1221     Clinical Impression Statement Pt had reported soreness at the beginning of the session today. She did report that she did a lot with her L shoulder yesterday, i reminded her to take rest and listen to her body to avoid muscle overuse and tendinitis. MT done to the neck and shoulders to relieve ongoing stiffness. Progressed flexion and abduction AROM with weights, only fatigue noted and cues to keep shoulder from extending with ABD. She still demonstrates difficulty with reaching to shoulder height and away from body w/o compensating with UT. She is compliant with the ongoing HEP but OH reaching shows to limit her function with ADLs. She would continue to benefit from PT to improve functional ROM to improve tolerance with ADLs.    Personal Factors and Comorbidities Time since onset of injury/illness/exacerbation;Age    PT Frequency 2x / week    PT Duration 8 weeks    PT Treatment/Interventions ADLs/Self Care Home Management;Aquatic Therapy;Cryotherapy;Electrical Stimulation;Moist Heat;Therapeutic activities;Therapeutic exercise;Neuromuscular re-education;Manual techniques;Patient/family education;Passive range of motion;Dry needling;Taping;Vasopneumatic Device;Joint Manipulations;Spinal Manipulations    PT Next Visit Plan focus on left shoulder ROM, manual to left UE, strength as tolerated for Left UT, elbow and wrist flexibility; grip strength    PT Home Exercise Plan [email or web address]    Consulted and Agree  with  Plan of Care Patient             Patient will benefit from skilled therapeutic intervention in order to improve the following deficits and impairments:  Decreased range of motion, Impaired UE functional use, Increased muscle spasms, Pain, Decreased activity tolerance, Postural dysfunction, Increased edema, Decreased strength, Impaired flexibility  Visit Diagnosis: Stiffness of left shoulder, not elsewhere classified  Stiffness of left elbow, not elsewhere classified  Acute pain of left shoulder  Localized edema  Muscle weakness (generalized)     Problem List Patient Active Problem List   Diagnosis Date Noted   S/P ORIF (open reduction internal fixation) fracture 01/15/2021   Hyperkalemia 12/20/2020   Primary hyperparathyroidism ([place]) 12/20/2020   Need for immunization against influenza 12/20/2020   Hypertension 07/20/2020   Hyperlipidemia 07/20/2020   Osteoporosis 07/20/2020   Tobacco abuse 07/20/2020    Artist Pais, PTA 05/03/2021, 12:23 PM  [facility] [address]  [place], [place], [address] Phone: [phone]   Fax:  [phone]  Name: Mary Fitzpatrick MRN: [id number] Date of Birth: 1940/06/16

## 2021-05-08 ENCOUNTER — Ambulatory Visit: Payer: Medicare Other

## 2021-05-08 ENCOUNTER — Other Ambulatory Visit: Payer: Self-pay

## 2021-05-08 DIAGNOSIS — M25512 Pain in left shoulder: Secondary | ICD-10-CM

## 2021-05-08 DIAGNOSIS — M25612 Stiffness of left shoulder, not elsewhere classified: Secondary | ICD-10-CM

## 2021-05-08 DIAGNOSIS — M25622 Stiffness of left elbow, not elsewhere classified: Secondary | ICD-10-CM

## 2021-05-08 DIAGNOSIS — M6281 Muscle weakness (generalized): Secondary | ICD-10-CM

## 2021-05-08 DIAGNOSIS — R600 Localized edema: Secondary | ICD-10-CM

## 2021-05-08 NOTE — Therapy (Signed)
[facility] [address]  [place], [place], [address] Phone: [phone]   Fax:  [phone]  Physical Therapy Treatment  Patient Details  Name: Mary Fitzpatrick MRN: [id number] Date of Birth: February 14, 1941 Referring Provider (PT): Justice Britain   Encounter Date: 05/08/2021   PT End of Session - 05/08/21 1056     Visit Number 11    Date for PT Re-Evaluation 05/28/21    Authorization Type MCR    PT Start Time 1017    PT Stop Time 1056    PT Time Calculation (min) 39 min    Activity Tolerance Patient tolerated treatment well;Patient limited by fatigue    Behavior During Therapy [facility] for tasks assessed/performed             Past Medical History:  Diagnosis Date   Arthritis    Hyperlipidemia    Hypertension    Parathyroid abnormality ([place])    nodule on one of the glands    Past Surgical History:  Procedure Laterality Date   BREAST EXCISIONAL BIOPSY Left    BREAST EXCISIONAL BIOPSY Right    GASTRECTOMY     Due to a ruptured bleeding ulcer   LAPAROTOMY     exploratory kink in bowel due to water sking   ORIF HUMERUS FRACTURE Left 01/15/2021   Procedure: OPEN REDUCTION INTERNAL FIXATION (ORIF) PROXIMAL HUMERUS FRACTURE;  Surgeon: Justice Britain, MD;  [facility];  Service: Orthopedics;  Laterality: Left;   TONSILLECTOMY      There were no vitals filed for this visit.   Subjective Assessment - 05/08/21 1022     Subjective Pt reports that she was doing good yesterday but last night hurt a little more,    Pertinent History HTN    Patient Stated Goals restore her ROM and strength    Currently in Pain? No/denies                [facility] PT Assessment - 05/08/21 0001       Assessment   Next MD Visit 05/29/21      AROM   Overall AROM Comments in supine    Left Shoulder Flexion 111 Degrees    Left Shoulder ABduction 119 Degrees                           [facility] Adult PT  Treatment/Exercise - 05/08/21 0001       Shoulder Exercises: Supine   Flexion Strengthening;AAROM;Left;10 reps;Weights    Shoulder Flexion Weight (lbs) 2    Other Supine Exercises AAROM with cane into flexion 3x10"      Shoulder Exercises: Standing   External Rotation Strengthening;Left;10 reps;Theraband    Theraband Level (Shoulder External Rotation) Level 2 (Red)    Internal Rotation Strengthening;Left;20 reps;Theraband    Theraband Level (Shoulder Internal Rotation) Level 2 (Red)    Extension Strengthening;Both;10 reps;Theraband    Theraband Level (Shoulder Extension) Level 2 (Red)    Row Strengthening;Both;20 reps;Theraband    Theraband Level (Shoulder Row) Level 2 (Red)      Shoulder Exercises: ROM/Strengthening   UBE (Upper Arm Bike) L1.5 x 22mn, raising the level to increse flexion from 3-6 min    Wall Wash 20 reps into flexion    Other ROM/Strengthening Exercises cabinet reaches into flexion 10 reps   tactile cues to keep scapula depressed  PT Short Term Goals - 05/03/21 1212       PT SHORT TERM GOAL #1   Title Ind with intial HEP    Time 2    Period Weeks    Status Achieved   2/17   Target Date 04/16/21      PT SHORT TERM GOAL #2   Title Decreased pain in the left hand by >=50% at EOD and in the morning.    Time 4    Period Weeks    Status Achieved   2/17              PT Long Term Goals - 05/03/21 1212       PT LONG TERM GOAL #1   Title Improved left shoulder functional ROM to allow pt to perform ADLS including dressing, grooming and OH activities with minimal difficulty.    Time 8    Period Weeks    Status On-going   still showing difficulty with raising shoulder to eye level and movements away from body w/o UT substitution   Target Date 05/28/21      PT LONG TERM GOAL #2   Title Improved left UE strength to 4+/5 to allow patient to peform ADLs including carrying, lifting and gardening.    Time 8    Period Weeks     Status On-going      PT LONG TERM GOAL #3   Title Pt to demonstrate full left elbow extension and be able to WB on elbow without pain.    Time 8    Period Weeks    Status On-going      PT LONG TERM GOAL #4   Title Improved left grip strength to at least 10# to improve function of left hand.    Time 8    Period Weeks    Status On-going      PT LONG TERM GOAL #5   Title Ind with advanced HEP to maintain goals made in PT    Time 8    Period Weeks    Status Partially Met                   Plan - 05/08/21 1057     Clinical Impression Statement Pt does show improvement in L shoulder ROM overhead. We did a lot of OH exercises today, which caused more fatigue in the shoulder. Progressed HEP with red TB for rows, extension, and ER. Today pt did seem to have less trouble with raising her arm OH. At times required some tactile cues to guide the proper scapulohumeral rythym and to prevent UT substiution. Pt is making progress toward goals.    Personal Factors and Comorbidities Time since onset of injury/illness/exacerbation;Age    PT Frequency 2x / week    PT Duration 8 weeks    PT Treatment/Interventions ADLs/Self Care Home Management;Aquatic Therapy;Cryotherapy;Electrical Stimulation;Moist Heat;Therapeutic activities;Therapeutic exercise;Neuromuscular re-education;Manual techniques;Patient/family education;Passive range of motion;Dry needling;Taping;Vasopneumatic Device;Joint Manipulations;Spinal Manipulations    PT Next Visit Plan focus on left shoulder ROM, manual to left UE, strength as tolerated for Left UT, elbow and wrist flexibility; grip strength    PT Home Exercise Plan [email or web address]    Consulted and Agree with Plan of Care Patient             Patient will benefit from skilled therapeutic intervention in order to improve the following deficits and impairments:  Decreased range of motion, Impaired UE functional use, Increased muscle spasms, Pain, Decreased activity  tolerance, Postural  dysfunction, Increased edema, Decreased strength, Impaired flexibility  Visit Diagnosis: Stiffness of left shoulder, not elsewhere classified  Stiffness of left elbow, not elsewhere classified  Acute pain of left shoulder  Localized edema  Muscle weakness (generalized)     Problem List Patient Active Problem List   Diagnosis Date Noted   S/P ORIF (open reduction internal fixation) fracture 01/15/2021   Hyperkalemia 12/20/2020   Primary hyperparathyroidism ([place]) 12/20/2020   Need for immunization against influenza 12/20/2020   Hypertension 07/20/2020   Hyperlipidemia 07/20/2020   Osteoporosis 07/20/2020   Tobacco abuse 07/20/2020    Artist Pais, PTA 05/08/2021, 11:55 AM  [facility] [address]  [place], [place], [address] Phone: [phone]   Fax:  [phone]  Name: Mary Fitzpatrick MRN: [id number] Date of Birth: [birth year]

## 2021-05-10 ENCOUNTER — Ambulatory Visit: Payer: Medicare Other | Admitting: Physical Therapy

## 2021-05-10 ENCOUNTER — Encounter: Payer: Self-pay | Admitting: Physical Therapy

## 2021-05-10 ENCOUNTER — Other Ambulatory Visit: Payer: Self-pay

## 2021-05-10 DIAGNOSIS — R600 Localized edema: Secondary | ICD-10-CM

## 2021-05-10 DIAGNOSIS — M25622 Stiffness of left elbow, not elsewhere classified: Secondary | ICD-10-CM

## 2021-05-10 DIAGNOSIS — M6281 Muscle weakness (generalized): Secondary | ICD-10-CM

## 2021-05-10 DIAGNOSIS — M25512 Pain in left shoulder: Secondary | ICD-10-CM

## 2021-05-10 DIAGNOSIS — M25612 Stiffness of left shoulder, not elsewhere classified: Secondary | ICD-10-CM | POA: Diagnosis not present

## 2021-05-10 NOTE — Therapy (Signed)
[facility] [address]  [place], [place], [address] Phone: [phone]   Fax:  [phone]  Physical Therapy Treatment  Patient Details  Name: Mary Fitzpatrick MRN: [id number] Date of Birth: 1941/02/08 Referring Provider (PT): Justice Britain   Encounter Date: 05/10/2021   PT End of Session - 05/10/21 1020     Visit Number 12    Date for PT Re-Evaluation 05/28/21    Authorization Type MCR    Progress Note Due on Visit 20    PT Start Time 1018    PT Stop Time 1102    PT Time Calculation (min) 44 min    Activity Tolerance Patient tolerated treatment well;Patient limited by fatigue    Behavior During Therapy [facility] for tasks assessed/performed             Past Medical History:  Diagnosis Date   Arthritis    Hyperlipidemia    Hypertension    Parathyroid abnormality ([place])    nodule on one of the glands    Past Surgical History:  Procedure Laterality Date   BREAST EXCISIONAL BIOPSY Left    BREAST EXCISIONAL BIOPSY Right    GASTRECTOMY     Due to a ruptured bleeding ulcer   LAPAROTOMY     exploratory kink in bowel due to water sking   ORIF HUMERUS FRACTURE Left 01/15/2021   Procedure: OPEN REDUCTION INTERNAL FIXATION (ORIF) PROXIMAL HUMERUS FRACTURE;  Surgeon: Justice Britain, MD;  Location: [facility];  Service: Orthopedics;  Laterality: Left;   TONSILLECTOMY      There were no vitals filed for this visit.   Subjective Assessment - 05/10/21 1030     Subjective Pt. reports very stiff this morning.    Pertinent History HTN    Patient Stated Goals restore her ROM and strength    Currently in Pain? No/denies                               [facility] Adult PT Treatment/Exercise - 05/10/21 0001       Shoulder Exercises: Pulleys   Flexion 3 minutes    Scaption 3 minutes      Shoulder Exercises: ROM/Strengthening   UBE (Upper Arm Bike) L1 x 10 min (40f3b) - extended time needed due to  stiffness.    Ball on Wall small circles 3 x 10 CW & CCW,      Manual Therapy   Manual Therapy Joint mobilization;Soft tissue mobilization    Manual therapy comments to improve L shoulder ROM and decrease pain    [facility] Mob[facility]    Soft tissue mobilization STM to L UT, LS, upper rhomboids                       PT Short Term Goals - 05/03/21 1212       PT SHORT TERM GOAL #1   Title Ind with intial HEP    Time 2    Period Weeks    Status Achieved   2/17   Target Date 04/16/21      PT SHORT TERM GOAL #2   Title Decreased pain in the left hand by >=50% at EOD and in the morning.    Time 4    Period Weeks    Status Achieved   2/17  PT Long Term Goals - 05/03/21 1212       PT LONG TERM GOAL #1   Title Improved left shoulder functional ROM to allow pt to perform ADLS including dressing, grooming and OH activities with minimal difficulty.    Time 8    Period Weeks    Status On-going   still showing difficulty with raising shoulder to eye level and movements away from body w/o UT substitution   Target Date 05/28/21      PT LONG TERM GOAL #2   Title Improved left UE strength to 4+/5 to allow patient to peform ADLs including carrying, lifting and gardening.    Time 8    Period Weeks    Status On-going      PT LONG TERM GOAL #3   Title Pt to demonstrate full left elbow extension and be able to WB on elbow without pain.    Time 8    Period Weeks    Status On-going      PT LONG TERM GOAL #4   Title Improved left grip strength to at least 10# to improve function of left hand.    Time 8    Period Weeks    Status On-going      PT LONG TERM GOAL #5   Title Ind with advanced HEP to maintain goals made in PT    Time 8    Period Weeks    Status Partially Met                   Plan - 05/10/21 1107     Clinical Impression Statement Pt. had much more stiffness/soreness in L shoulder today,  which was discouraging but reassured that sometimes progress is not constant improvement.  Due to stiffness spent more time on AROM on UBE and PROM with pulleys, given information on where to purchase inexpensive pulleys at home.  End of session focused on mobs to [facility] joint to improve ROM and STM to periscapular musculature and UT.    Personal Factors and Comorbidities Time since onset of injury/illness/exacerbation;Age    PT Frequency 2x / week    PT Duration 8 weeks    PT Treatment/Interventions ADLs/Self Care Home Management;Aquatic Therapy;Cryotherapy;Electrical Stimulation;Moist Heat;Therapeutic activities;Therapeutic exercise;Neuromuscular re-education;Manual techniques;Patient/family education;Passive range of motion;Dry needling;Taping;Vasopneumatic Device;Joint Manipulations;Spinal Manipulations    PT Next Visit Plan focus on left shoulder ROM, manual to left UE, strength as tolerated for Left UT, elbow and wrist flexibility; grip strength    PT Home Exercise Plan [id number]    Consulted and Agree with Plan of Care Patient             Patient will benefit from skilled therapeutic intervention in order to improve the following deficits and impairments:  Decreased range of motion, Impaired UE functional use, Increased muscle spasms, Pain, Decreased activity tolerance, Postural dysfunction, Increased edema, Decreased strength, Impaired flexibility  Visit Diagnosis: Stiffness of left shoulder, not elsewhere classified  Stiffness of left elbow, not elsewhere classified  Acute pain of left shoulder  Localized edema  Muscle weakness (generalized)     Problem List Patient Active Problem List   Diagnosis Date Noted   S/P ORIF (open reduction internal fixation) fracture 01/15/2021   Hyperkalemia 12/20/2020   Primary hyperparathyroidism ([place]) 12/20/2020   Need for immunization against influenza 12/20/2020   Hypertension 07/20/2020   Hyperlipidemia 07/20/2020   Osteoporosis  07/20/2020   Tobacco abuse 07/20/2020    Rennie Natter, PT, DPT  05/10/2021, 11:09 AM  [facility] [address]  [place], [place], [address] Phone: [phone]   Fax:  [phone]  Name: Mary Fitzpatrick MRN: [id number] Date of Birth: February 21, 1941

## 2021-05-14 ENCOUNTER — Ambulatory Visit: Payer: Medicare Other

## 2021-05-14 ENCOUNTER — Other Ambulatory Visit: Payer: Self-pay

## 2021-05-14 DIAGNOSIS — M6281 Muscle weakness (generalized): Secondary | ICD-10-CM

## 2021-05-14 DIAGNOSIS — M25612 Stiffness of left shoulder, not elsewhere classified: Secondary | ICD-10-CM

## 2021-05-14 DIAGNOSIS — R600 Localized edema: Secondary | ICD-10-CM

## 2021-05-14 DIAGNOSIS — M25622 Stiffness of left elbow, not elsewhere classified: Secondary | ICD-10-CM

## 2021-05-14 DIAGNOSIS — M25512 Pain in left shoulder: Secondary | ICD-10-CM

## 2021-05-14 NOTE — Therapy (Signed)
[facility] [address]  [place], [place], [address] Phone: [phone]   Fax:  [phone]  Physical Therapy Treatment  Patient Details  Name: Mary Fitzpatrick MRN: [id number] Date of Birth: 1940-04-05 Referring Provider (PT): Justice Britain   Encounter Date: 05/14/2021   PT End of Session - 05/14/21 1109     Visit Number 13    Date for PT Re-Evaluation 05/28/21    Authorization Type MCR    Progress Note Due on Visit 20    PT Start Time 1017    PT Stop Time 1059    PT Time Calculation (min) 42 min    Activity Tolerance Patient tolerated treatment well    Behavior During Therapy [facility] for tasks assessed/performed             Past Medical History:  Diagnosis Date   Arthritis    Hyperlipidemia    Hypertension    Parathyroid abnormality ([place])    nodule on one of the glands    Past Surgical History:  Procedure Laterality Date   BREAST EXCISIONAL BIOPSY Left    BREAST EXCISIONAL BIOPSY Right    GASTRECTOMY     Due to a ruptured bleeding ulcer   LAPAROTOMY     exploratory kink in bowel due to water sking   ORIF HUMERUS FRACTURE Left 01/15/2021   Procedure: OPEN REDUCTION INTERNAL FIXATION (ORIF) PROXIMAL HUMERUS FRACTURE;  Surgeon: Justice Britain, MD;  Location: [facility];  Service: Orthopedics;  Laterality: Left;   TONSILLECTOMY      There were no vitals filed for this visit.   Subjective Assessment - 05/14/21 1019     Subjective Pt reports her shoulder is moving a lot better, she cleaned out her whole refridgerator w/o any increased pain.    Pertinent History HTN    Patient Stated Goals restore her ROM and strength    Currently in Pain? No/denies                               [facility] Adult PT Treatment/Exercise - 05/14/21 0001       Shoulder Exercises: Supine   Protraction Strengthening;Both;15 reps    Protraction Limitations AAROM with cane, 3lb    Flexion  Strengthening;AAROM;Left;Weights;15 reps    Shoulder Flexion Weight (lbs) 2    Other Supine Exercises AAROM with cane into flexion with 3lb weight 3x10"      Shoulder Exercises: Standing   Flexion AROM;Left;10 reps    Flexion Limitations finger ladder, TC for scapula depression and post glide    ABduction AROM;Left;10 reps    ABduction Limitations into scaption    Extension Strengthening;Both;10 reps;Theraband    Theraband Level (Shoulder Extension) Level 3 (Green)    Row Strengthening;Both;10 reps;Theraband    Theraband Level (Shoulder Row) Level 3 (Green)    Other Standing Exercises scapular depression with green TB 20 reps      Shoulder Exercises: ROM/Strengthening   UBE (Upper Arm Bike) L1.5 x 6 min (71fd/3back) -                       PT Short Term Goals - 05/03/21 1212       PT SHORT TERM GOAL #1   Title Ind with intial HEP    Time 2    Period Weeks    Status Achieved   2/17   Target Date 04/16/21  PT SHORT TERM GOAL #2   Title Decreased pain in the left hand by >=50% at EOD and in the morning.    Time 4    Period Weeks    Status Achieved   2/17              PT Long Term Goals - 05/03/21 1212       PT LONG TERM GOAL #1   Title Improved left shoulder functional ROM to allow pt to perform ADLS including dressing, grooming and OH activities with minimal difficulty.    Time 8    Period Weeks    Status On-going   still showing difficulty with raising shoulder to eye level and movements away from body w/o UT substitution   Target Date 05/28/21      PT LONG TERM GOAL #2   Title Improved left UE strength to 4+/5 to allow patient to peform ADLs including carrying, lifting and gardening.    Time 8    Period Weeks    Status On-going      PT LONG TERM GOAL #3   Title Pt to demonstrate full left elbow extension and be able to WB on elbow without pain.    Time 8    Period Weeks    Status On-going      PT LONG TERM GOAL #4   Title Improved  left grip strength to at least 10# to improve function of left hand.    Time 8    Period Weeks    Status On-going      PT LONG TERM GOAL #5   Title Ind with advanced HEP to maintain goals made in PT    Time 8    Period Weeks    Status Partially Met                   Plan - 05/14/21 1117     Clinical Impression Statement Today was a strength focused session. Pt required cues during STS to avoid genu valgus. Cues needed with sidesteps to keep toes and torso pointed fwd. She was able to complete all exercises/interventions today, only with reports of muscle fatigue after exercises.    Personal Factors and Comorbidities Time since onset of injury/illness/exacerbation;Age    PT Frequency 2x / week    PT Duration 8 weeks    PT Treatment/Interventions ADLs/Self Care Home Management;Aquatic Therapy;Cryotherapy;Electrical Stimulation;Moist Heat;Therapeutic activities;Therapeutic exercise;Neuromuscular re-education;Manual techniques;Patient/family education;Passive range of motion;Dry needling;Taping;Vasopneumatic Device;Joint Manipulations;Spinal Manipulations    PT Next Visit Plan focus on left shoulder ROM, manual to left UE, strength as tolerated for Left UT, elbow and wrist flexibility; grip strength    PT Home Exercise Plan [id number]    Consulted and Agree with Plan of Care Patient             Patient will benefit from skilled therapeutic intervention in order to improve the following deficits and impairments:  Decreased range of motion, Impaired UE functional use, Increased muscle spasms, Pain, Decreased activity tolerance, Postural dysfunction, Increased edema, Decreased strength, Impaired flexibility  Visit Diagnosis: Stiffness of left shoulder, not elsewhere classified  Stiffness of left elbow, not elsewhere classified  Acute pain of left shoulder  Localized edema  Muscle weakness (generalized)     Problem List Patient Active Problem List   Diagnosis Date Noted    S/P ORIF (open reduction internal fixation) fracture 01/15/2021   Hyperkalemia 12/20/2020   Primary hyperparathyroidism ([place]) 12/20/2020   Need for immunization against influenza 12/20/2020  Hypertension 07/20/2020   Hyperlipidemia 07/20/2020   Osteoporosis 07/20/2020   Tobacco abuse 07/20/2020    Artist Pais, PTA 05/14/2021, 11:19 AM  [facility] [address]  [place], [place], [address] Phone: [phone]   Fax:  [phone]  Name: Gwendy Boeder MRN: [id number] Date of Birth: [birth year]

## 2021-05-16 ENCOUNTER — Telehealth: Payer: Self-pay | Admitting: Family

## 2021-05-16 MED ORDER — AMLODIPINE BESYLATE 2.5 MG PO TABS: 2.5000 mg | ORAL_TABLET | Freq: Every day | ORAL | 0 refills | Status: DC

## 2021-05-16 NOTE — Telephone Encounter (Signed)
Rx sent in and patient notified.  

## 2021-05-16 NOTE — Telephone Encounter (Signed)
Pt is leaving town Saturday, and need refill as soon as possible. Stated her pharmacy sent a request but there were not updates. Please advise.  ? ?Medication: amLODipine (NORVASC) 2.5 MG tablet  ? ?Has the patient contacted their pharmacy? Yes.   ? ? ?Preferred Pharmacy: [id number] - [place], [place] - [address]  ?[name] [address]  ?Phone:  [phone]  Fax:  [phone]  ? ? ? ?

## 2021-05-17 ENCOUNTER — Ambulatory Visit: Payer: Medicare Other | Attending: Orthopedic Surgery | Admitting: Physical Therapy

## 2021-05-17 ENCOUNTER — Other Ambulatory Visit: Payer: Self-pay

## 2021-05-17 ENCOUNTER — Encounter: Payer: Self-pay | Admitting: Physical Therapy

## 2021-05-17 DIAGNOSIS — M25512 Pain in left shoulder: Secondary | ICD-10-CM | POA: Insufficient documentation

## 2021-05-17 DIAGNOSIS — M79642 Pain in left hand: Secondary | ICD-10-CM | POA: Diagnosis present

## 2021-05-17 DIAGNOSIS — M25622 Stiffness of left elbow, not elsewhere classified: Secondary | ICD-10-CM | POA: Insufficient documentation

## 2021-05-17 DIAGNOSIS — R600 Localized edema: Secondary | ICD-10-CM | POA: Diagnosis present

## 2021-05-17 DIAGNOSIS — M6281 Muscle weakness (generalized): Secondary | ICD-10-CM | POA: Insufficient documentation

## 2021-05-17 DIAGNOSIS — M25522 Pain in left elbow: Secondary | ICD-10-CM | POA: Insufficient documentation

## 2021-05-17 DIAGNOSIS — M25612 Stiffness of left shoulder, not elsewhere classified: Secondary | ICD-10-CM | POA: Insufficient documentation

## 2021-05-17 NOTE — Therapy (Signed)
[facility] ?[place] ?[place], [place], [address] ?Phone: [phone]   Fax:  [phone] ? ?Physical Therapy Treatment ? ?Patient Details  ?Name: Mary Fitzpatrick ?MRN: [id number] ?Date of Birth: Jan 16, 1941 ?Referring Provider (PT): Justice Britain ? ? ?Encounter Date: 05/17/2021 ? ? PT End of Session - 05/17/21 1017   ? ? Visit Number 14   ? Date for PT Re-Evaluation 05/28/21   ? Authorization Type MCR   ? Progress Note Due on Visit 20   ? PT Start Time 1017   ? PT Stop Time 1102   ? PT Time Calculation (min) 45 min   ? Activity Tolerance Patient tolerated treatment well   ? Behavior During Therapy [facility] for tasks assessed/performed   ? ?  ?  ? ?  ? ? ?Past Medical History:  ?Diagnosis Date  ? Arthritis   ? Hyperlipidemia   ? Hypertension   ? Parathyroid abnormality ([place])   ? nodule on one of the glands  ? ? ?Past Surgical History:  ?Procedure Laterality Date  ? BREAST EXCISIONAL BIOPSY Left   ? BREAST EXCISIONAL BIOPSY Right   ? GASTRECTOMY    ? Due to a ruptured bleeding ulcer  ? LAPAROTOMY    ? exploratory kink in bowel due to water sking  ? ORIF HUMERUS FRACTURE Left 01/15/2021  ? Procedure: OPEN REDUCTION INTERNAL FIXATION (ORIF) PROXIMAL HUMERUS FRACTURE;  Surgeon: Justice Britain, MD;  Location: [facility];  Service: Orthopedics;  Laterality: Left;  ? TONSILLECTOMY    ? ? ?There were no vitals filed for this visit. ? ? Subjective Assessment - 05/17/21 1020   ? ? Subjective Pt denies pain but shoulder still stiff - "takes a little while to get going".   ? Pertinent History HTN   ? Patient Stated Goals restore her ROM and strength   ? Currently in Pain? No/denies   ? ?  ?  ? ?  ? ? ? ? ? ? ? ? ? ? ? ? ? ? ? ? ? ? ? ? [facility] Adult PT Treatment/Exercise - 05/17/21 0001   ? ?  ? Shoulder Exercises: Supine  ? Protraction Strengthening;Both;20 reps   ? Protraction Weight (lbs) first set 3#, second set #5   ? Protraction Limitations AAROM with cane and weight   ?  Flexion Strengthening;AAROM;Left;Weights;20 reps   ? Shoulder Flexion Weight (lbs) 3   ? Flexion Limitations in semireclined position with cane, AAROM   ?  ? Shoulder Exercises: Standing  ? Flexion Strengthening;Left;20 reps;Weights   ? Shoulder Flexion Weight (lbs) 1   ? Flexion Limitations to first shelf   ?  ? Shoulder Exercises: ROM/Strengthening  ? UBE (Upper Arm Bike) L1.5 x 6 min (3 fwd/3 back) -   ? Wall Wash 10 reps flexion on finger ladder, 20 reps scaption on wall   ? ?  ?  ? ?  ? ? ? ? ? ? ? ? ? ? ? ? PT Short Term Goals - 05/03/21 1212   ? ?  ? PT SHORT TERM GOAL #1  ? Title Ind with intial HEP   ? Time 2   ? Period Weeks   ? Status Achieved   2/17  ? Target Date 04/16/21   ?  ? PT SHORT TERM GOAL #2  ? Title Decreased pain in the left hand by >=50% at EOD and in the morning.   ? Time 4   ?  Period Weeks   ? Status Achieved   2/17  ? ?  ?  ? ?  ? ? ? ? PT Long Term Goals - 05/03/21 1212   ? ?  ? PT LONG TERM GOAL #1  ? Title Improved left shoulder functional ROM to allow pt to perform ADLS including dressing, grooming and OH activities with minimal difficulty.   ? Time 8   ? Period Weeks   ? Status On-going   still showing difficulty with raising shoulder to eye level and movements away from body w/o UT substitution  ? Target Date 05/28/21   ?  ? PT LONG TERM GOAL #2  ? Title Improved left UE strength to 4+/5 to allow patient to peform ADLs including carrying, lifting and gardening.   ? Time 8   ? Period Weeks   ? Status On-going   ?  ? PT LONG TERM GOAL #3  ? Title Pt to demonstrate full left elbow extension and be able to WB on elbow without pain.   ? Time 8   ? Period Weeks   ? Status On-going   ?  ? PT LONG TERM GOAL #4  ? Title Improved left grip strength to at least 10# to improve function of left hand.   ? Time 8   ? Period Weeks   ? Status On-going   ?  ? PT LONG TERM GOAL #5  ? Title Ind with advanced HEP to maintain goals made in PT   ? Time 8   ? Period Weeks   ? Status Partially Met   ? ?  ?   ? ?  ? ? ? ? ? ? ? ? Plan - 05/17/21 1108   ? ? Clinical Impression Statement Continued to focus on L shoulder strengthening today.  She was able to raise 1# weight to first shelf today, but fatigued quickly.  Able to progress resistance as well with other shoulder exercises, reporting only fatigue but no pain.  She is going out of town next week reviewed exercises that can perform to continue progress.   ? Personal Factors and Comorbidities Time since onset of injury/illness/exacerbation;Age   ? PT Frequency 2x / week   ? PT Duration 8 weeks   ? PT Treatment/Interventions ADLs/Self Care Home Management;Aquatic Therapy;Cryotherapy;Electrical Stimulation;Moist Heat;Therapeutic activities;Therapeutic exercise;Neuromuscular re-education;Manual techniques;Patient/family education;Passive range of motion;Dry needling;Taping;Vasopneumatic Device;Joint Manipulations;Spinal Manipulations   ? PT Next Visit Plan focus on left shoulder ROM, manual to left UE, strength as tolerated for Left UT, elbow and wrist flexibility; grip strength   ? PT Home Exercise Plan [id number]   ? Consulted and Agree with Plan of Care Patient   ? ?  ?  ? ?  ? ? ?Patient will benefit from skilled therapeutic intervention in order to improve the following deficits and impairments:  Decreased range of motion, Impaired UE functional use, Increased muscle spasms, Pain, Decreased activity tolerance, Postural dysfunction, Increased edema, Decreased strength, Impaired flexibility ? ?Visit Diagnosis: ?Stiffness of left shoulder, not elsewhere classified ? ?Stiffness of left elbow, not elsewhere classified ? ?Acute pain of left shoulder ? ?Localized edema ? ?Muscle weakness (generalized) ? ? ? ? ?Problem List ?Patient Active Problem List  ? Diagnosis Date Noted  ? S/P ORIF (open reduction internal fixation) fracture 01/15/2021  ? Hyperkalemia 12/20/2020  ? Primary hyperparathyroidism ([place]) 12/20/2020  ? Need for immunization against influenza 12/20/2020  ?  Hypertension 07/20/2020  ? Hyperlipidemia 07/20/2020  ? Osteoporosis 07/20/2020  ? Tobacco  abuse 07/20/2020  ? ? ?Rennie Natter, PT, DPT  ?05/17/2021, 11:11 AM ? ?[facility] ?[place] ?[place], [place], [address] ?Phone: [phone]   Fax:  [phone] ? ?Name: Mary Fitzpatrick ?MRN: [id number] ?Date of Birth: 12/16/1940 ? ? ? ?

## 2021-05-24 ENCOUNTER — Encounter: Payer: Medicare Other | Admitting: Physical Therapy

## 2021-05-28 ENCOUNTER — Ambulatory Visit: Payer: Medicare Other | Admitting: Physical Therapy

## 2021-05-28 ENCOUNTER — Other Ambulatory Visit: Payer: Self-pay

## 2021-05-28 ENCOUNTER — Encounter: Payer: Self-pay | Admitting: Physical Therapy

## 2021-05-28 DIAGNOSIS — M25612 Stiffness of left shoulder, not elsewhere classified: Secondary | ICD-10-CM

## 2021-05-28 DIAGNOSIS — M6281 Muscle weakness (generalized): Secondary | ICD-10-CM

## 2021-05-28 DIAGNOSIS — M25622 Stiffness of left elbow, not elsewhere classified: Secondary | ICD-10-CM

## 2021-05-28 DIAGNOSIS — R600 Localized edema: Secondary | ICD-10-CM

## 2021-05-28 DIAGNOSIS — M25512 Pain in left shoulder: Secondary | ICD-10-CM

## 2021-05-28 NOTE — Therapy (Signed)
[place] ?[facility] ?[address] ?[place], [place], [address] ?Phone: [phone]   Fax:  [phone] ? ?Physical Therapy Treatment/Progress Note ?Reporting Period 05/03/2021 to 05/28/2021 ? ?See note below for Objective Data and Assessment of Progress/Goals.  ? ? ? ?Patient Details  ?Name: Mary Fitzpatrick ?MRN: [id number] ?Date of Birth: February 10, 1941 ?Referring Provider (PT): Justice Britain ? ? ?Encounter Date: 05/28/2021 ? ? PT End of Session - 05/28/21 1019   ? ? Visit Number 15   ? Date for PT Re-Evaluation 07/09/21   ? Authorization Type MCR   ? Progress Note Due on Visit 25   ? PT Start Time 1017   ? PT Stop Time 1100   ? PT Time Calculation (min) 43 min   ? Activity Tolerance Patient tolerated treatment well   ? Behavior During Therapy [facility] for tasks assessed/performed   ? ?  ?  ? ?  ? ? ?Past Medical History:  ?Diagnosis Date  ? Arthritis   ? Hyperlipidemia   ? Hypertension   ? Parathyroid abnormality ([place])   ? nodule on one of the glands  ? ? ?Past Surgical History:  ?Procedure Laterality Date  ? BREAST EXCISIONAL BIOPSY Left   ? BREAST EXCISIONAL BIOPSY Right   ? GASTRECTOMY    ? Due to a ruptured bleeding ulcer  ? LAPAROTOMY    ? exploratory kink in bowel due to water sking  ? ORIF HUMERUS FRACTURE Left 01/15/2021  ? Procedure: OPEN REDUCTION INTERNAL FIXATION (ORIF) PROXIMAL HUMERUS FRACTURE;  Surgeon: Justice Britain, MD;  Location: [facility];  Service: Orthopedics;  Laterality: Left;  ? TONSILLECTOMY    ? ? ?There were no vitals filed for this visit. ? ? Subjective Assessment - 05/28/21 1018   ? ? Subjective Pt. sees her orthopedist tomorrow.  Was on vacation last week but did what she could on her own to continue strengthening.   ? Pertinent History HTN   ? Patient Stated Goals restore her ROM and strength   ? Currently in Pain? No/denies   ? ?  ?  ? ?  ? ? ? ? ? [facility] PT Assessment - 05/28/21 0001   ? ?  ? Assessment  ? Medical Diagnosis s/p ORIF left humeral  fx   ? Referring Provider (PT) Justice Britain   ? Onset Date/Surgical Date 01/15/21   ? Hand Dominance Right   ? Next MD Visit 05/29/21   ?  ? Precautions  ? Precautions None   ?  ? Restrictions  ? Weight Bearing Restrictions No   ?  ? Observation/Other Assessments  ? Focus on Therapeutic Outcomes (FOTO)  shoulder 55%   ?  ? AROM  ? Overall AROM  Deficits   ? Overall AROM Comments in sitting   ? AROM Assessment Site Shoulder   ? Right/Left Shoulder Left;Right   ? Right Shoulder Flexion 155 Degrees   ? Right Shoulder ABduction 155 Degrees   ? Left Shoulder Extension 50 Degrees   ? Left Shoulder Flexion 80 Degrees   in supine 111 deg  ? Left Shoulder ABduction 85 Degrees   in supine 119 deg  ? Left Shoulder Internal Rotation --   functional to L hip  ? Left Shoulder External Rotation --   funtional to nape of neck  ?  ? Strength  ? Left Shoulder Flexion 2+/5   ? Left Shoulder Extension 4+/5   ? Left Shoulder ABduction 2+/5   ?  Left Shoulder Internal Rotation 4+/5   ? Left Shoulder External Rotation 4+/5   ? Left Elbow Flexion 4+/5   ? Left Elbow Extension 5/5   ? Right Hand Grip (lbs) 30   ? Left Hand Grip (lbs) 12   average 3 trials, 15lbs, 15lbs, 5 lbs, (fatigued)  ? ?  ?  ? ?  ? ? ? ? ? ? ? ? ? ? ? ? ? ? ? ? [place] Adult PT Treatment/Exercise - 05/28/21 0001   ? ?  ? Shoulder Exercises: Standing  ? Other Standing Exercises IR stretch with towel - challenging.   ? Other Standing Exercises counter push-ups 2 x 10   ?  ? Shoulder Exercises: Pulleys  ? Flexion 3 minutes   ? Flexion Limitations in standing, focusing on eccentric control   ? Scaption 3 minutes   ? Scaption Limitations in standing focusing on eccentric control   ? Other Pulley Exercises IR stretch 5 x 15 sec hold   ?  ? Shoulder Exercises: ROM/Strengthening  ? UBE (Upper Arm Bike) L1 x 10 min   extended time needed due to stiffness after vacation  ? ?  ?  ? ?  ? ? ? ? ? ? ? ? ? ? ? ? PT Short Term Goals - 05/03/21 1212   ? ?  ? PT SHORT TERM GOAL #1  ? Title  Ind with intial HEP   ? Time 2   ? Period Weeks   ? Status Achieved   2/17  ? Target Date 04/16/21   ?  ? PT SHORT TERM GOAL #2  ? Title Decreased pain in the left hand by >=50% at EOD and in the morning.   ? Time 4   ? Period Weeks   ? Status Achieved   2/17  ? ?  ?  ? ?  ? ? ? ? PT Long Term Goals - 05/28/21 1029   ? ?  ? PT LONG TERM GOAL #1  ? Title Improved left shoulder functional ROM to allow pt to perform ADLS including dressing, grooming and OH activities with minimal difficulty.   ? Time 6   ? Period Weeks   ? Status On-going   still showing difficulty with raising shoulder to eye level and movements away from body w/o UT substitution  05/28/21- see flow sheet.  ? Target Date 07/09/21   ?  ? PT LONG TERM GOAL #2  ? Title Improved left UE strength to 4+/5 to allow patient to peform ADLs including carrying, lifting and gardening.   ? Time 6   ? Period Weeks   ? Status On-going   05/28/21- see flow sheet.  ? Target Date 07/09/21   ?  ? PT LONG TERM GOAL #3  ? Title Pt to demonstrate full left elbow extension and be able to WB on elbow without pain.   ? Time --   ? Period Weeks   ? Status Achieved   ? Target Date 05/28/21   ?  ? PT LONG TERM GOAL #4  ? Title Improved left grip strength to at least 10# to improve function of left hand.   ? Time 8   ? Period Weeks   ? Status Achieved   ? Target Date 05/28/21   ?  ? PT LONG TERM GOAL #5  ? Title Ind with advanced HEP to maintain goals made in PT   ? Time 6   ? Period Weeks   ?  Status Partially Met   05/28/21- met fo current, getting pulleys tomorrow.  ? Target Date 07/09/21   ? ?  ?  ? ?  ? ? ? ? ? ? ? ? Plan - 05/28/21 1202   ? ? Clinical Impression Statement Patient demonstrates improving L shoulder strength and AROM, able to raise L arm to shoulder height now.  Her grip strength has improved significantly, and she no longer has any limitations in L elbow ROM or strength, meeting LTG #3 and #4.  She still demonstrates deficits in functional ROM and strength in L  shoulder, and her FOTO has only improved to 55%.  She would benefit from continued skilled therapy 2x/week for additional 6 weeks in order to continue progress.  Today focused on pulley exercises as she has purchased from [place] and should be arriving soon.   ? Personal Factors and Comorbidities Time since onset of injury/illness/exacerbation;Age   ? PT Frequency 2x / week   ? PT Duration 8 weeks   ? PT Treatment/Interventions ADLs/Self Care Home Management;Aquatic Therapy;Cryotherapy;Electrical Stimulation;Moist Heat;Therapeutic activities;Therapeutic exercise;Neuromuscular re-education;Manual techniques;Patient/family education;Passive range of motion;Dry needling;Taping;Vasopneumatic Device;Joint Manipulations;Spinal Manipulations   ? PT Next Visit Plan focus on left shoulder ROM, manual to left UE, strength as tolerated for Left UT, elbow and wrist flexibility; grip strength   ? PT Home Exercise Plan [id number]   ? Consulted and Agree with Plan of Care Patient   ? ?  ?  ? ?  ? ? ?Patient will benefit from skilled therapeutic intervention in order to improve the following deficits and impairments:  Decreased range of motion, Impaired UE functional use, Increased muscle spasms, Pain, Decreased activity tolerance, Postural dysfunction, Increased edema, Decreased strength, Impaired flexibility ? ?Visit Diagnosis: ?Stiffness of left shoulder, not elsewhere classified ? ?Stiffness of left elbow, not elsewhere classified ? ?Acute pain of left shoulder ? ?Localized edema ? ?Muscle weakness (generalized) ? ? ? ? ?Problem List ?Patient Active Problem List  ? Diagnosis Date Noted  ? S/P ORIF (open reduction internal fixation) fracture 01/15/2021  ? Hyperkalemia 12/20/2020  ? Primary hyperparathyroidism ([place]) 12/20/2020  ? Need for immunization against influenza 12/20/2020  ? Hypertension 07/20/2020  ? Hyperlipidemia 07/20/2020  ? Osteoporosis 07/20/2020  ? Tobacco abuse 07/20/2020  ? ? ?Rennie Natter, PT, DPT   ?05/28/2021, 12:10 PM ? ?[place] ?[facility] ?[address] ?[place], [place], [address] ?Phone: [phone]   Fax:  [phone] ? ?Name: Mary Fitzpatrick

## 2021-05-30 ENCOUNTER — Ambulatory Visit: Payer: Medicare Other | Admitting: Physical Therapy

## 2021-05-31 ENCOUNTER — Ambulatory Visit: Payer: Medicare Other

## 2021-05-31 ENCOUNTER — Other Ambulatory Visit: Payer: Self-pay

## 2021-05-31 DIAGNOSIS — M25612 Stiffness of left shoulder, not elsewhere classified: Secondary | ICD-10-CM

## 2021-05-31 DIAGNOSIS — M6281 Muscle weakness (generalized): Secondary | ICD-10-CM

## 2021-05-31 DIAGNOSIS — M25512 Pain in left shoulder: Secondary | ICD-10-CM

## 2021-05-31 DIAGNOSIS — M25622 Stiffness of left elbow, not elsewhere classified: Secondary | ICD-10-CM

## 2021-05-31 DIAGNOSIS — R600 Localized edema: Secondary | ICD-10-CM

## 2021-05-31 NOTE — Therapy (Signed)
[place] ?[facility] ?[address] ?[place], [place], [address] ?Phone: [phone]   Fax:  [phone] ? ?Physical Therapy Treatment ? ?Patient Details  ?Name: Mary Fitzpatrick ?MRN: [id number] ?Date of Birth: March 26, 1940 ?Referring Provider (PT): Justice Britain ? ? ?Encounter Date: 05/31/2021 ? ? PT End of Session - 05/31/21 1059   ? ? Visit Number 16   ? Date for PT Re-Evaluation 07/09/21   ? Authorization Type MCR   ? Progress Note Due on Visit 25   ? PT Start Time 1017   ? PT Stop Time 1058   ? PT Time Calculation (min) 41 min   ? Activity Tolerance Patient tolerated treatment well   ? Behavior During Therapy [facility] for tasks assessed/performed   ? ?  ?  ? ?  ? ? ?Past Medical History:  ?Diagnosis Date  ? Arthritis   ? Hyperlipidemia   ? Hypertension   ? Parathyroid abnormality ([place])   ? nodule on one of the glands  ? ? ?Past Surgical History:  ?Procedure Laterality Date  ? BREAST EXCISIONAL BIOPSY Left   ? BREAST EXCISIONAL BIOPSY Right   ? GASTRECTOMY    ? Due to a ruptured bleeding ulcer  ? LAPAROTOMY    ? exploratory kink in bowel due to water sking  ? ORIF HUMERUS FRACTURE Left 01/15/2021  ? Procedure: OPEN REDUCTION INTERNAL FIXATION (ORIF) PROXIMAL HUMERUS FRACTURE;  Surgeon: Justice Britain, MD;  Location[facility];  Service: Orthopedics;  Laterality: Left;  ? TONSILLECTOMY    ? ? ?There were no vitals filed for this visit. ? ? Subjective Assessment - 05/31/21 1023   ? ? Subjective Just sore from the shot in my shoulder and still stiff. Brought in OH pulleys.   ? Pertinent History HTN   ? Patient Stated Goals restore her ROM and strength   ? Currently in Pain? No/denies   ? ?  ?  ? ?  ? ? ? ? ? ? ? ? ? ? ? ? ? ? ? ? ? ? ? ? [place] Adult PT Treatment/Exercise - 05/31/21 0001   ? ?  ? Shoulder Exercises: Standing  ? External Rotation Strengthening;Left;10 reps;Theraband   2 sets  ? Theraband Level (Shoulder External Rotation) Level 2 (Red)   ? Internal Rotation  Strengthening;Left;10 reps;Theraband   ? Theraband Level (Shoulder Internal Rotation) Level 2 (Red)   ? Internal Rotation Limitations 2 sets   ? Flexion AAROM;Both;10 reps   2 sets  ? Flexion Limitations prayer AAROM, short lever with staggered stance   ?  ? Shoulder Exercises: ROM/Strengthening  ? UBE (Upper Arm Bike) L1.5 x 6 min (3 fwd/3 back)   ? Other ROM/Strengthening Exercises prayer AAROM supported on wall 2x10   ?  ? Shoulder Exercises: Stretch  ? Other Shoulder Stretches L shoulder AAROM IR with arm behind back with red TB 8x5"   ? Other Shoulder Stretches demo and reviewed revied AA IR with towel behind back   ? ?  ?  ? ?  ? ? ? ? ? ? ? ? ? ? PT Education - 05/31/21 1024   ? ? Education Details reviewed setup for OH pulleys, gave different ways to do OH pulleys for shoulder ROM.   ? Person(s) Educated Patient   ? Methods Explanation;Demonstration   ? Comprehension Verbalized understanding;Returned demonstration   ? ?  ?  ? ?  ? ? ? PT Short Term Goals - 05/03/21  1212   ? ?  ? PT SHORT TERM GOAL #1  ? Title Ind with intial HEP   ? Time 2   ? Period Weeks   ? Status Achieved   2/17  ? Target Date 04/16/21   ?  ? PT SHORT TERM GOAL #2  ? Title Decreased pain in the left hand by >=50% at EOD and in the morning.   ? Time 4   ? Period Weeks   ? Status Achieved   2/17  ? ?  ?  ? ?  ? ? ? ? PT Long Term Goals - 05/28/21 1029   ? ?  ? PT LONG TERM GOAL #1  ? Title Improved left shoulder functional ROM to allow pt to perform ADLS including dressing, grooming and OH activities with minimal difficulty.   ? Time 6   ? Period Weeks   ? Status On-going   still showing difficulty with raising shoulder to eye level and movements away from body w/o UT substitution  05/28/21- see flow sheet.  ? Target Date 07/09/21   ?  ? PT LONG TERM GOAL #2  ? Title Improved left UE strength to 4+/5 to allow patient to peform ADLs including carrying, lifting and gardening.   ? Time 6   ? Period Weeks   ? Status On-going   05/28/21- see flow  sheet.  ? Target Date 07/09/21   ?  ? PT LONG TERM GOAL #3  ? Title Pt to demonstrate full left elbow extension and be able to WB on elbow without pain.   ? Time --   ? Period Weeks   ? Status Achieved   ? Target Date 05/28/21   ?  ? PT LONG TERM GOAL #4  ? Title Improved left grip strength to at least 10# to improve function of left hand.   ? Time 8   ? Period Weeks   ? Status Achieved   ? Target Date 05/28/21   ?  ? PT LONG TERM GOAL #5  ? Title Ind with advanced HEP to maintain goals made in PT   ? Time 6   ? Period Weeks   ? Status Partially Met   05/28/21- met fo current, getting pulleys tomorrow.  ? Target Date 07/09/21   ? ?  ?  ? ?  ? ? ? ? ? ? ? ? Plan - 05/31/21 1059   ? ? Clinical Impression Statement Pt arrived with pulley system purchased for home use. Briefly reviewed how to set these up in doorway and different ways to [facility] ROM with OH pulleys. Pt did demonstrate more ROM with prayer exercise, although no formal measurements taken. Continued to work on scapular stabilization to improve humeral head compression when lifting OH.   ? Personal Factors and Comorbidities Time since onset of injury/illness/exacerbation;Age   ? PT Frequency 2x / week   ? PT Duration 8 weeks   ? PT Treatment/Interventions ADLs/Self Care Home Management;Aquatic Therapy;Cryotherapy;Electrical Stimulation;Moist Heat;Therapeutic activities;Therapeutic exercise;Neuromuscular re-education;Manual techniques;Patient/family education;Passive range of motion;Dry needling;Taping;Vasopneumatic Device;Joint Manipulations;Spinal Manipulations   ? PT Next Visit Plan focus on left shoulder ROM, manual to left UE, strength as tolerated for Left UT, elbow and wrist flexibility; grip strength   ? PT Home Exercise Plan [id number]   ? Consulted and Agree with Plan of Care Patient   ? ?  ?  ? ?  ? ? ?Patient will benefit from skilled therapeutic intervention in order to improve the following deficits  and impairments:  Decreased range of motion,  Impaired UE functional use, Increased muscle spasms, Pain, Decreased activity tolerance, Postural dysfunction, Increased edema, Decreased strength, Impaired flexibility ? ?Visit Diagnosis: ?Stiffness of left shoulder, not elsewhere classified ? ?Stiffness of left elbow, not elsewhere classified ? ?Acute pain of left shoulder ? ?Localized edema ? ?Muscle weakness (generalized) ? ? ? ? ?Problem List ?Patient Active Problem List  ? Diagnosis Date Noted  ? S/P ORIF (open reduction internal fixation) fracture 01/15/2021  ? Hyperkalemia 12/20/2020  ? Primary hyperparathyroidism ([place]) 12/20/2020  ? Need for immunization against influenza 12/20/2020  ? Hypertension 07/20/2020  ? Hyperlipidemia 07/20/2020  ? Osteoporosis 07/20/2020  ? Tobacco abuse 07/20/2020  ? ? ?Artist Pais, PTA ?05/31/2021, 12:10 PM ? ?[place] ?[facility] ?[address] ?[place], [place], [address] ?Phone: [phone]   Fax:  [phone] ? ?Name: Chaley Castellanos ?MRN: [id number] ?Date of Birth: Apr 18, 1940 ? ? ? ?

## 2021-06-05 ENCOUNTER — Ambulatory Visit: Payer: Medicare Other

## 2021-06-05 ENCOUNTER — Other Ambulatory Visit: Payer: Self-pay

## 2021-06-05 DIAGNOSIS — M25512 Pain in left shoulder: Secondary | ICD-10-CM

## 2021-06-05 DIAGNOSIS — M6281 Muscle weakness (generalized): Secondary | ICD-10-CM

## 2021-06-05 DIAGNOSIS — M25612 Stiffness of left shoulder, not elsewhere classified: Secondary | ICD-10-CM | POA: Diagnosis not present

## 2021-06-05 DIAGNOSIS — R600 Localized edema: Secondary | ICD-10-CM

## 2021-06-05 DIAGNOSIS — M25622 Stiffness of left elbow, not elsewhere classified: Secondary | ICD-10-CM

## 2021-06-05 NOTE — Therapy (Signed)
[place] ?[facility] ?[address] ?[place], [place], [address] ?Phone: [phone]   Fax:  [phone] ? ?Physical Therapy Treatment ? ?Patient Details  ?Name: Mary Fitzpatrick ?MRN: [id number] ?Date of Birth: 07-Dec-1940 ?Referring Provider (PT): Justice Britain ? ? ?Encounter Date: 06/05/2021 ? ? PT End of Session - 06/05/21 1157   ? ? Visit Number 17   ? Date for PT Re-Evaluation 07/09/21   ? Authorization Type MCR   ? Progress Note Due on Visit 25   ? PT Start Time 1100   ? PT Stop Time 4580   ? PT Time Calculation (min) 43 min   ? Activity Tolerance Patient tolerated treatment well   ? Behavior During Therapy [facility] for tasks assessed/performed   ? ?  ?  ? ?  ? ? ?Past Medical History:  ?Diagnosis Date  ? Arthritis   ? Hyperlipidemia   ? Hypertension   ? Parathyroid abnormality ([place])   ? nodule on one of the glands  ? ? ?Past Surgical History:  ?Procedure Laterality Date  ? BREAST EXCISIONAL BIOPSY Left   ? BREAST EXCISIONAL BIOPSY Right   ? GASTRECTOMY    ? Due to a ruptured bleeding ulcer  ? LAPAROTOMY    ? exploratory kink in bowel due to water sking  ? ORIF HUMERUS FRACTURE Left 01/15/2021  ? Procedure: OPEN REDUCTION INTERNAL FIXATION (ORIF) PROXIMAL HUMERUS FRACTURE;  Surgeon: Justice Britain, MD;  [facility];  Service: Orthopedics;  Laterality: Left;  ? TONSILLECTOMY    ? ? ?There were no vitals filed for this visit. ? ? Subjective Assessment - 06/05/21 1105   ? ? Subjective Still a struggle getting my arm overhead.   ? Pertinent History HTN   ? Patient Stated Goals restore her ROM and strength   ? Currently in Pain? No/denies   ? ?  ?  ? ?  ? ? ? ? ? [facility] PT Assessment - 06/05/21 0001   ? ?  ? AROM  ? Left Shoulder Flexion 95 Degrees   with prayer exercise  ? ?  ?  ? ?  ? ? ? ? ? ? ? ? ? ? ? ? ? ? ? ? [place] Adult PT Treatment/Exercise - 06/05/21 0001   ? ?  ? Shoulder Exercises: Standing  ? Other Standing Exercises counter push-ups 2 x 10   increased ROM   ?  ? Shoulder Exercises: ROM/Strengthening  ? UBE (Upper Arm Bike) L2.0 x 6 min   ? Other ROM/Strengthening Exercises prayer AAROM supported on wall 2x10   ? Other ROM/Strengthening Exercises cabinet reaches AAROM with R arm assisting L x 10   ?  ? Manual Therapy  ? Manual Therapy Soft tissue mobilization;Myofascial release;Passive ROM   ? Soft tissue mobilization STM and TPR to L LS, infraspinatus   ? Passive ROM L shoulder flexion, scaption and IR   ? ?  ?  ? ?  ? ? ? ? ? ? ? ? ? ? ? ? PT Short Term Goals - 05/03/21 1212   ? ?  ? PT SHORT TERM GOAL #1  ? Title Ind with intial HEP   ? Time 2   ? Period Weeks   ? Status Achieved   2/17  ? Target Date 04/16/21   ?  ? PT SHORT TERM GOAL #2  ? Title Decreased pain in the left hand by >=50% at EOD and in the morning.   ?  Time 4   ? Period Weeks   ? Status Achieved   2/17  ? ?  ?  ? ?  ? ? ? ? PT Long Term Goals - 05/28/21 1029   ? ?  ? PT LONG TERM GOAL #1  ? Title Improved left shoulder functional ROM to allow pt to perform ADLS including dressing, grooming and OH activities with minimal difficulty.   ? Time 6   ? Period Weeks   ? Status On-going   still showing difficulty with raising shoulder to eye level and movements away from body w/o UT substitution  05/28/21- see flow sheet.  ? Target Date 07/09/21   ?  ? PT LONG TERM GOAL #2  ? Title Improved left UE strength to 4+/5 to allow patient to peform ADLs including carrying, lifting and gardening.   ? Time 6   ? Period Weeks   ? Status On-going   05/28/21- see flow sheet.  ? Target Date 07/09/21   ?  ? PT LONG TERM GOAL #3  ? Title Pt to demonstrate full left elbow extension and be able to WB on elbow without pain.   ? Time --   ? Period Weeks   ? Status Achieved   ? Target Date 05/28/21   ?  ? PT LONG TERM GOAL #4  ? Title Improved left grip strength to at least 10# to improve function of left hand.   ? Time 8   ? Period Weeks   ? Status Achieved   ? Target Date 05/28/21   ?  ? PT LONG TERM GOAL #5  ? Title Ind with  advanced HEP to maintain goals made in PT   ? Time 6   ? Period Weeks   ? Status Partially Met   05/28/21- met fo current, getting pulleys tomorrow.  ? Target Date 07/09/21   ? ?  ?  ? ?  ? ? ? ? ? ? ? ? Plan - 06/05/21 1157   ? ? Clinical Impression Statement Pt at this time feels the cortizone shot didn't help much. She does demonstrate an improvement in L shoulder ROM was assistance from the R UE. She still shows mod UT compensation when reaching [facility] with L UE alone. TTP noted along the L infrapspinatus. Tactile cues for depression and upward rotation of scapula with OH reaches. Verbal cues for proper form with counter push ups.   ? Personal Factors and Comorbidities Time since onset of injury/illness/exacerbation;Age   ? PT Frequency 2x / week   ? PT Duration 8 weeks   ? PT Treatment/Interventions ADLs/Self Care Home Management;Aquatic Therapy;Cryotherapy;Electrical Stimulation;Moist Heat;Therapeutic activities;Therapeutic exercise;Neuromuscular re-education;Manual techniques;Patient/family education;Passive range of motion;Dry needling;Taping;Vasopneumatic Device;Joint Manipulations;Spinal Manipulations   ? PT Next Visit Plan focus on left shoulder ROM, manual to left UE, strength as tolerated for Left UT, elbow and wrist flexibility; grip strength   ? PT Home Exercise Plan [id number]   ? Consulted and Agree with Plan of Care Patient   ? ?  ?  ? ?  ? ? ?Patient will benefit from skilled therapeutic intervention in order to improve the following deficits and impairments:  Decreased range of motion, Impaired UE functional use, Increased muscle spasms, Pain, Decreased activity tolerance, Postural dysfunction, Increased edema, Decreased strength, Impaired flexibility ? ?Visit Diagnosis: ?Stiffness of left shoulder, not elsewhere classified ? ?Stiffness of left elbow, not elsewhere classified ? ?Acute pain of left shoulder ? ?Localized edema ? ?Muscle weakness (generalized) ? ? ? ? ?  Problem List ?Patient Active Problem  List  ? Diagnosis Date Noted  ? S/P ORIF (open reduction internal fixation) fracture 01/15/2021  ? Hyperkalemia 12/20/2020  ? Primary hyperparathyroidism ([place]) 12/20/2020  ? Need for immunization against influenza 12/20/2020  ? Hypertension 07/20/2020  ? Hyperlipidemia 07/20/2020  ? Osteoporosis 07/20/2020  ? Tobacco abuse 07/20/2020  ? ? ?Artist Pais, PTA ?06/05/2021, 12:04 PM ? ?[place] ?[facility] ?[address] ?[place], [place], [address] ?Phone: [phone]   Fax:  [phone] ? ?Name: Mary Fitzpatrick ?MRN: [id number] ?Date of Birth: Aug 11, 1940 ? ? ? ?

## 2021-06-07 ENCOUNTER — Other Ambulatory Visit: Payer: Self-pay

## 2021-06-07 ENCOUNTER — Ambulatory Visit: Payer: Medicare Other

## 2021-06-07 DIAGNOSIS — R600 Localized edema: Secondary | ICD-10-CM

## 2021-06-07 DIAGNOSIS — M25612 Stiffness of left shoulder, not elsewhere classified: Secondary | ICD-10-CM

## 2021-06-07 DIAGNOSIS — M25622 Stiffness of left elbow, not elsewhere classified: Secondary | ICD-10-CM

## 2021-06-07 DIAGNOSIS — M25512 Pain in left shoulder: Secondary | ICD-10-CM

## 2021-06-07 DIAGNOSIS — M6281 Muscle weakness (generalized): Secondary | ICD-10-CM

## 2021-06-07 NOTE — Therapy (Signed)
[place] ?[facility] ?[place] ?[place], [place], [address] ?Phone: [phone]   Fax:  [phone] ? ?Physical Therapy Treatment ? ?Patient Details  ?Name: Mary Fitzpatrick ?MRN: [id number] ?Date of Birth: 1941/01/06 ?Referring Provider (PT): Justice Britain ? ? ?Encounter Date: 06/07/2021 ? ? PT End of Session - 06/07/21 1014   ? ? Visit Number 18   ? Date for PT Re-Evaluation 07/09/21   ? Authorization Type MCR   ? Progress Note Due on Visit 25   ? PT Start Time [phone]   ? PT Stop Time 1012   ? PT Time Calculation (min) 41 min   ? Activity Tolerance Patient tolerated treatment well   ? Behavior During Therapy [facility] for tasks assessed/performed   ? ?  ?  ? ?  ? ? ?Past Medical History:  ?Diagnosis Date  ? Arthritis   ? Hyperlipidemia   ? Hypertension   ? Parathyroid abnormality ([place])   ? nodule on one of the glands  ? ? ?Past Surgical History:  ?Procedure Laterality Date  ? BREAST EXCISIONAL BIOPSY Left   ? BREAST EXCISIONAL BIOPSY Right   ? GASTRECTOMY    ? Due to a ruptured bleeding ulcer  ? LAPAROTOMY    ? exploratory kink in bowel due to water sking  ? ORIF HUMERUS FRACTURE Left 01/15/2021  ? Procedure: OPEN REDUCTION INTERNAL FIXATION (ORIF) PROXIMAL HUMERUS FRACTURE;  Surgeon: Justice Britain, MD;  Location: [facility];  Service: Orthopedics;  Laterality: Left;  ? TONSILLECTOMY    ? ? ?There were no vitals filed for this visit. ? ? Subjective Assessment - 06/07/21 0934   ? ? Subjective "I am not as stiff this morning."   ? Pertinent History HTN   ? Patient Stated Goals restore her ROM and strength   ? Currently in Pain? No/denies   ? ?  ?  ? ?  ? ? ? ? ? [facility] PT Assessment - 06/07/21 0001   ? ?  ? Strength  ? Left Shoulder Flexion 2+/5   ? Left Shoulder ABduction 2+/5   ? Left Shoulder Internal Rotation 4+/5   ? Left Shoulder External Rotation 4+/5   ? ?  ?  ? ?  ? ? ? ? ? ? ? ? ? ? ? ? ? ? ? ? [place] Adult PT Treatment/Exercise - 06/07/21 0001   ? ?  ? Shoulder  Exercises: Supine  ? Flexion AAROM;Left;10 reps;Weights   ? Shoulder Flexion Weight (lbs) 2   ? Flexion Limitations 3 sec pause at max ROM; R LTR for oblique stretch   ?  ? Shoulder Exercises: Sidelying  ? ABduction AAROM;Left;10 reps;Weights   ? ABduction Weight (lbs) 2   ?  ? Shoulder Exercises: Pulleys  ? Flexion Limitations 10x10"   ? Scaption Limitations 10x10"   ?  ? Shoulder Exercises: ROM/Strengthening  ? UBE (Upper Arm Bike) L2.0 x 6 min   ? Other ROM/Strengthening Exercises cabinet reaches L flex and scap x 10   ? ?  ?  ? ?  ? ? ? ? ? ? ? ? ? ? ? ? PT Short Term Goals - 05/03/21 1212   ? ?  ? PT SHORT TERM GOAL #1  ? Title Ind with intial HEP   ? Time 2   ? Period Weeks   ? Status Achieved   2/17  ? Target Date 04/16/21   ?  ? PT SHORT TERM GOAL #  2  ? Title Decreased pain in the left hand by >=50% at EOD and in the morning.   ? Time 4   ? Period Weeks   ? Status Achieved   2/17  ? ?  ?  ? ?  ? ? ? ? PT Long Term Goals - 06/07/21 0943   ? ?  ? PT LONG TERM GOAL #1  ? Title Improved left shoulder functional ROM to allow pt to perform ADLS including dressing, grooming and OH activities with minimal difficulty.   ? Time 6   ? Period Weeks   ? Status On-going   still showing difficulty with raising shoulder to eye level and movements away from body w/o UT substitution  05/28/21- see flow sheet.  ? Target Date 07/09/21   ?  ? PT LONG TERM GOAL #2  ? Title Improved left UE strength to 4+/5 to allow patient to peform ADLs including carrying, lifting and gardening.   ? Time 6   ? Period Weeks   ? Status On-going   06/07/21- see flow sheet.  ? Target Date 07/09/21   ?  ? PT LONG TERM GOAL #3  ? Title Pt to demonstrate full left elbow extension and be able to WB on elbow without pain.   ? Period Weeks   ? Status Achieved   ? Target Date 05/28/21   ?  ? PT LONG TERM GOAL #4  ? Title Improved left grip strength to at least 10# to improve function of left hand.   ? Time 8   ? Period Weeks   ? Status Achieved   ? Target  Date 05/28/21   ?  ? PT LONG TERM GOAL #5  ? Title Ind with advanced HEP to maintain goals made in PT   ? Time 6   ? Period Weeks   ? Status Partially Met   05/28/21- met fo current, getting pulleys tomorrow.  ? Target Date 07/09/21   ? ?  ?  ? ?  ? ? ? ? ? ? ? ? Plan - 06/07/21 1015   ? ? Clinical Impression Statement Pt at this point still showing weakness in her L shoulder with flex and abd. Worked on [place] again today with pulleys, cabinet reaches, and with weights for AAROM. She is showing less UT activation with OH reaching. After the exercises today pt was able to reach to 2nd level of cabinet with L UE only and good scapular mechanics. Pt is progressing toward goals.   ? Personal Factors and Comorbidities Time since onset of injury/illness/exacerbation;Age   ? PT Frequency 2x / week   ? PT Duration 8 weeks   ? PT Treatment/Interventions ADLs/Self Care Home Management;Aquatic Therapy;Cryotherapy;Electrical Stimulation;Moist Heat;Therapeutic activities;Therapeutic exercise;Neuromuscular re-education;Manual techniques;Patient/family education;Passive range of motion;Dry needling;Taping;Vasopneumatic Device;Joint Manipulations;Spinal Manipulations   ? PT Next Visit Plan focus on left shoulder ROM, manual to left UE, strength as tolerated for Left UT, elbow and wrist flexibility; grip strength   ? PT Home Exercise Plan [email or web address]   ? Consulted and Agree with Plan of Care Patient   ? ?  ?  ? ?  ? ? ?Patient will benefit from skilled therapeutic intervention in order to improve the following deficits and impairments:  Decreased range of motion, Impaired UE functional use, Increased muscle spasms, Pain, Decreased activity tolerance, Postural dysfunction, Increased edema, Decreased strength, Impaired flexibility ? ?Visit Diagnosis: ?Stiffness of left shoulder, not elsewhere classified ? ?Stiffness of left elbow, not elsewhere classified ? ?  Acute pain of left shoulder ? ?Localized edema ? ?Muscle weakness  (generalized) ? ? ? ? ?Problem List ?Patient Active Problem List  ? Diagnosis Date Noted  ? S/P ORIF (open reduction internal fixation) fracture 01/15/2021  ? Hyperkalemia 12/20/2020  ? Primary hyperparathyroidism ([place]) 12/20/2020  ? Need for immunization against influenza 12/20/2020  ? Hypertension 07/20/2020  ? Hyperlipidemia 07/20/2020  ? Osteoporosis 07/20/2020  ? Tobacco abuse 07/20/2020  ? ? ?Artist Pais, PTA ?06/07/2021, 11:08 AM ? ?[place] ?[facility] ?[place] ?[place], [place], [address] ?Phone: [phone]   Fax:  [phone] ? ?Name: Mary Fitzpatrick ?MRN: [id number] ?Date of Birth: 22-Feb-1941 ? ? ? ?

## 2021-06-11 ENCOUNTER — Ambulatory Visit: Payer: Medicare Other | Admitting: Physical Therapy

## 2021-06-11 ENCOUNTER — Other Ambulatory Visit: Payer: Self-pay

## 2021-06-11 ENCOUNTER — Encounter: Payer: Self-pay | Admitting: Physical Therapy

## 2021-06-11 DIAGNOSIS — M25612 Stiffness of left shoulder, not elsewhere classified: Secondary | ICD-10-CM | POA: Diagnosis not present

## 2021-06-11 DIAGNOSIS — M6281 Muscle weakness (generalized): Secondary | ICD-10-CM

## 2021-06-11 DIAGNOSIS — M25512 Pain in left shoulder: Secondary | ICD-10-CM

## 2021-06-11 DIAGNOSIS — R600 Localized edema: Secondary | ICD-10-CM

## 2021-06-11 DIAGNOSIS — M79642 Pain in left hand: Secondary | ICD-10-CM

## 2021-06-11 DIAGNOSIS — M25622 Stiffness of left elbow, not elsewhere classified: Secondary | ICD-10-CM

## 2021-06-11 NOTE — Patient Instructions (Signed)
Access Code: [email or web address] ?URL: [email or web address] ?Date: 06/11/2021 ?Prepared by: Raynelle Fanning ? ?Exercises ?- Putty Squeezes  - 1 x daily - 7 x weekly - 3 sets - 10 reps ?- Supine Shoulder Flexion Extension AAROM with Dowel  - 1 x daily - 7 x weekly - 2 sets - 10 reps ?- Supine Shoulder Scaption with Dowel  - 1 x daily - 7 x weekly - 2 sets - 10 reps ?- Supine Shoulder External Rotation in 45 Degrees Abduction AAROM with Dowel  - 1 x daily - 7 x weekly - 2 sets - 10 reps ?- Seated Shoulder Flexion Slide at Table Top with Forearm in Neutral  - 1 x daily - 7 x weekly - 2 sets - 10 reps ?- Shoulder Flexion Wall Slide with Towel (Mirrored)  - 2 x daily - 7 x weekly - 2 sets - 10 reps ?- Sleeper Stretch  - 2 x daily - 7 x weekly - 1 sets - 3 reps - 30-60 sec hold ?- Supine Shoulder Flexion Extension AAROM with Dowel  - 1 x daily - 7 x weekly - 3 sets - 10 reps ?- Supine Shoulder Press  - 1 x daily - 7 x weekly - 3 sets - 10 reps ?- Shoulder External Rotation with Anchored Resistance  - 1 x daily - 7 x weekly - 3 sets - 10 reps ?- Standing Bilateral Low Shoulder Row with Anchored Resistance  - 1 x daily - 7 x weekly - 3 sets - 10 reps ?- Shoulder extension with resistance - Neutral  - 1 x daily - 7 x weekly - 3 sets - 10 reps ?- Standing Shoulder Internal Rotation AAROM with Pulley  - 1 x daily - 7 x weekly - 1 sets - 5 reps - 15 sec  hold ?- Standing Shoulder Flexion AAROM with Pulley in Front  - 1 x daily - 7 x weekly - 3 sets - 10 reps ?- Standing Shoulder Abduction AAROM with Pulley at Side  - 1 x daily - 7 x weekly - 3 sets - 10 reps ?- Sidelying Shoulder Horizontal Abduction  - 1 x daily - 3-4 x weekly - 1-3 sets - 10 reps ?

## 2021-06-11 NOTE — Therapy (Signed)
[place] ?[facility] ?[address] ?[place], [place], [address] ?Phone: [phone]   Fax:  [phone] ? ?Physical Therapy Treatment ? ?Patient Details  ?Name: Mary Fitzpatrick ?MRN: [id number] ?Date of Birth: 1940/03/22 ?Referring Provider (PT): Justice Britain ? ? ?Encounter Date: 06/11/2021 ? ? PT End of Session - 06/11/21 1432   ? ? Visit Number 19   ? Date for PT Re-Evaluation 07/09/21   ? Authorization Type MCR   ? Progress Note Due on Visit 25   ? PT Start Time 1432   ? PT Stop Time 4656   ? PT Time Calculation (min) 47 min   ? Activity Tolerance Patient tolerated treatment well   ? Behavior During Therapy [facility] for tasks assessed/performed   ? ?  ?  ? ?  ? ? ?Past Medical History:  ?Diagnosis Date  ? Arthritis   ? Hyperlipidemia   ? Hypertension   ? Parathyroid abnormality ([place])   ? nodule on one of the glands  ? ? ?Past Surgical History:  ?Procedure Laterality Date  ? BREAST EXCISIONAL BIOPSY Left   ? BREAST EXCISIONAL BIOPSY Right   ? GASTRECTOMY    ? Due to a ruptured bleeding ulcer  ? LAPAROTOMY    ? exploratory kink in bowel due to water sking  ? ORIF HUMERUS FRACTURE Left 01/15/2021  ? Procedure: OPEN REDUCTION INTERNAL FIXATION (ORIF) PROXIMAL HUMERUS FRACTURE;  Surgeon: Justice Britain, MD;  Location: [facility];  Service: Orthopedics;  Laterality: Left;  ? TONSILLECTOMY    ? ? ?There were no vitals filed for this visit. ? ? Subjective Assessment - 06/11/21 1434   ? ? Subjective I've come a long way since I saw you last. The hand is much better. Still have pain in the left elbow when I rest it on something. Overhead activities are still hard.   ? Patient Stated Goals restore her ROM and strength   ? Currently in Pain? No/denies   ? ?  ?  ? ?  ? ? ? ? ? ? ? ? ? ? ? ? ? ? ? ? ? ? ? ? [place] Adult PT Treatment/Exercise - 06/11/21 0001   ? ?  ? Shoulder Exercises: Supine  ? Flexion Left;Weights;15 reps   ? Shoulder Flexion Weight (lbs) 2   ?  ? Shoulder Exercises:  Prone  ? Horizontal ABduction 1 10 reps;Left   ?  ? Shoulder Exercises: Sidelying  ? External Rotation Left;15 reps   ? External Rotation Weight (lbs) 2   ? ABduction AAROM;Left;10 reps;Weights   ? ABduction Weight (lbs) 1   ? ABduction Limitations 2# cpuld only do 5 reps   ?  ? Shoulder Exercises: Standing  ? External Rotation Both;10 reps   ? External Rotation Weight (lbs) 2   ? Flexion Left;10 reps   ? Shoulder Flexion Weight (lbs) 2   ? Flexion Limitations 2 sets:1st set no wt   ?  ? Shoulder Exercises: Pulleys  ? Flexion 1 minute   ? Flexion Limitations 10x10"   ? Scaption 1 minute   ? Scaption Limitations 10x10"   ?  ? Shoulder Exercises: ROM/Strengthening  ? UBE (Upper Arm Bike) L2.0 x 6 min   ?  ? Shoulder Exercises: Stretch  ? Internal Rotation Stretch 1 rep   ? Internal Rotation Stretch Limitations prolonged hold using right hand   ?  ? Manual Therapy  ? Manual Therapy Joint mobilization   ?  Joint Mobilization P/A and A/P to left shoulder for assessment   ? ?  ?  ? ?  ? ? ? ? ? ? ? ? ? ? PT Education - 06/11/21 1523   ? ? Education Details added SDLY horizontal ABD   ? Person(s) Educated Patient   ? Methods Explanation;Demonstration;Handout   ? Comprehension Returned demonstration   ? ?  ?  ? ?  ? ? ? PT Short Term Goals - 05/03/21 1212   ? ?  ? PT SHORT TERM GOAL #1  ? Title Ind with intial HEP   ? Time 2   ? Period Weeks   ? Status Achieved   2/17  ? Target Date 04/16/21   ?  ? PT SHORT TERM GOAL #2  ? Title Decreased pain in the left hand by >=50% at EOD and in the morning.   ? Time 4   ? Period Weeks   ? Status Achieved   2/17  ? ?  ?  ? ?  ? ? ? ? PT Long Term Goals - 06/07/21 0943   ? ?  ? PT LONG TERM GOAL #1  ? Title Improved left shoulder functional ROM to allow pt to perform ADLS including dressing, grooming and OH activities with minimal difficulty.   ? Time 6   ? Period Weeks   ? Status On-going   still showing difficulty with raising shoulder to eye level and movements away from body w/o UT  substitution  05/28/21- see flow sheet.  ? Target Date 07/09/21   ?  ? PT LONG TERM GOAL #2  ? Title Improved left UE strength to 4+/5 to allow patient to peform ADLs including carrying, lifting and gardening.   ? Time 6   ? Period Weeks   ? Status On-going   06/07/21- see flow sheet.  ? Target Date 07/09/21   ?  ? PT LONG TERM GOAL #3  ? Title Pt to demonstrate full left elbow extension and be able to WB on elbow without pain.   ? Period Weeks   ? Status Achieved   ? Target Date 05/28/21   ?  ? PT LONG TERM GOAL #4  ? Title Improved left grip strength to at least 10# to improve function of left hand.   ? Time 8   ? Period Weeks   ? Status Achieved   ? Target Date 05/28/21   ?  ? PT LONG TERM GOAL #5  ? Title Ind with advanced HEP to maintain goals made in PT   ? Time 6   ? Period Weeks   ? Status Partially Met   05/28/21- met fo current, getting pulleys tomorrow.  ? Target Date 07/09/21   ? ?  ?  ? ?  ? ? ? ? ? ? ? ? Plan - 06/11/21 1524   ? ? Clinical Impression Statement Patient is progressing with strengthening but still has significant weakness with post shoulder. Added SDLY horizontal ABD to HEP. Good form with OH reaches today.   ? PT Frequency 2x / week   ? PT Duration 8 weeks   ? PT Treatment/Interventions ADLs/Self Care Home Management;Aquatic Therapy;Cryotherapy;Electrical Stimulation;Moist Heat;Therapeutic activities;Therapeutic exercise;Neuromuscular re-education;Manual techniques;Patient/family education;Passive range of motion;Dry needling;Taping;Vasopneumatic Device;Joint Manipulations;Spinal Manipulations   ? PT Next Visit Plan continue to focus on shoulder strength and ROM for functional goals.   ? ?  ?  ? ?  ? ? ?Patient will benefit from skilled therapeutic intervention in  order to improve the following deficits and impairments:  Decreased range of motion, Impaired UE functional use, Increased muscle spasms, Pain, Decreased activity tolerance, Postural dysfunction, Increased edema, Decreased  strength, Impaired flexibility ? ?Visit Diagnosis: ?Stiffness of left shoulder, not elsewhere classified ? ?Stiffness of left elbow, not elsewhere classified ? ?Acute pain of left shoulder ? ?Muscle weakness (generalized) ? ?Pain in left hand ? ?Localized edema ? ? ? ? ?Problem List ?Patient Active Problem List  ? Diagnosis Date Noted  ? S/P ORIF (open reduction internal fixation) fracture 01/15/2021  ? Hyperkalemia 12/20/2020  ? Primary hyperparathyroidism ([place]) 12/20/2020  ? Need for immunization against influenza 12/20/2020  ? Hypertension 07/20/2020  ? Hyperlipidemia 07/20/2020  ? Osteoporosis 07/20/2020  ? Tobacco abuse 07/20/2020  ? ? ?Madelyn Flavors, PT ?06/11/2021, 3:34 PM ? ?[place] ?[facility] ?[address] ?[place], [place], [address] ?Phone: [phone]   Fax:  [phone] ? ?Name: Gerilyn Stargell ?MRN: [id number] ?Date of Birth: [birth year] ? ? ? ?

## 2021-06-14 ENCOUNTER — Ambulatory Visit: Payer: Medicare Other

## 2021-06-14 DIAGNOSIS — M25622 Stiffness of left elbow, not elsewhere classified: Secondary | ICD-10-CM

## 2021-06-14 DIAGNOSIS — M6281 Muscle weakness (generalized): Secondary | ICD-10-CM

## 2021-06-14 DIAGNOSIS — R600 Localized edema: Secondary | ICD-10-CM

## 2021-06-14 DIAGNOSIS — M25522 Pain in left elbow: Secondary | ICD-10-CM

## 2021-06-14 DIAGNOSIS — M25512 Pain in left shoulder: Secondary | ICD-10-CM

## 2021-06-14 DIAGNOSIS — M25612 Stiffness of left shoulder, not elsewhere classified: Secondary | ICD-10-CM | POA: Diagnosis not present

## 2021-06-14 DIAGNOSIS — M79642 Pain in left hand: Secondary | ICD-10-CM

## 2021-06-14 NOTE — Therapy (Signed)
[place] ?[facility] ?[address] ?[place], [place], [address] ?Phone: [phone]   Fax:  [phone] ? ?Physical Therapy Treatment ? ?Patient Details  ?Name: Mary Fitzpatrick ?MRN: [id number] ?Date of Birth: 1940/09/01 ?Referring Provider (PT): Justice Britain ? ? ?Encounter Date: 06/14/2021 ? ? PT End of Session - 06/14/21 1016   ? ? Visit Number 20   ? Date for PT Re-Evaluation 07/09/21   ? Authorization Type MCR   ? Progress Note Due on Visit 25   ? PT Start Time [phone]   ? PT Stop Time 1014   ? PT Time Calculation (min) 41 min   ? Activity Tolerance Patient tolerated treatment well   ? Behavior During Therapy [facility] for tasks assessed/performed   ? ?  ?  ? ?  ? ? ?Past Medical History:  ?Diagnosis Date  ? Arthritis   ? Hyperlipidemia   ? Hypertension   ? Parathyroid abnormality ([place])   ? nodule on one of the glands  ? ? ?Past Surgical History:  ?Procedure Laterality Date  ? BREAST EXCISIONAL BIOPSY Left   ? BREAST EXCISIONAL BIOPSY Right   ? GASTRECTOMY    ? Due to a ruptured bleeding ulcer  ? LAPAROTOMY    ? exploratory kink in bowel due to water sking  ? ORIF HUMERUS FRACTURE Left 01/15/2021  ? Procedure: OPEN REDUCTION INTERNAL FIXATION (ORIF) PROXIMAL HUMERUS FRACTURE;  Surgeon: Justice Britain, MD;  Location: [facility];  Service: Orthopedics;  Laterality: Left;  ? TONSILLECTOMY    ? ? ?There were no vitals filed for this visit. ? ? Subjective Assessment - 06/14/21 0931   ? ? Subjective Still sitff, still challenging raising the arm OH but getting a little easier.   ? Pertinent History HTN   ? Patient Stated Goals restore her ROM and strength   ? Currently in Pain? No/denies   ? ?  ?  ? ?  ? ? ? ? ? [facility] PT Assessment - 06/14/21 0001   ? ?  ? AROM  ? Left Shoulder Flexion 113 Degrees   with prayer exercise  ? Left Shoulder ABduction 95 Degrees   with cane  ? ?  ?  ? ?  ? ? ? ? ? ? ? ? ? ? ? ? ? ? ? ? [place] Adult PT Treatment/Exercise - 06/14/21 0001   ? ?  ? Shoulder  Exercises: Standing  ? Horizontal ABduction Strengthening;Both;10 reps;Theraband   ? Theraband Level (Shoulder Horizontal ABduction) Level 2 (Red)   ? Horizontal ABduction Limitations back against doorframe   ? External Rotation Strengthening;Left;10 reps;Theraband   2 sets  ? Theraband Level (Shoulder External Rotation) Level 2 (Red)   ? External Rotation Limitations cues to keep nuetral alignment of shoulder   ? ABduction AAROM;12 reps   with cane  ? Other Standing Exercises wall push ups to mid ROM x 10   ? Other Standing Exercises serratus slides with foam roll x 12   ?  ? Shoulder Exercises: ROM/Strengthening  ? UBE (Upper Arm Bike) L2.0 3 min fwd/ 3 min back   ? Other ROM/Strengthening Exercises prayer AAROM supported on wall x15   ?  ? Shoulder Exercises: Isometric Strengthening  ? External Rotation Limitations with red TB, step out 3x10"   ?  ? Shoulder Exercises: Stretch  ? Other Shoulder Stretches posterior capsule stretch 2x30"   ?  ? Manual Therapy  ? Soft tissue mobilization STM and  TPR to L infraspinatus, teres group   ? ?  ?  ? ?  ? ? ? ? ? ? ? ? ? ? ? ? PT Short Term Goals - 05/03/21 1212   ? ?  ? PT SHORT TERM GOAL #1  ? Title Ind with intial HEP   ? Time 2   ? Period Weeks   ? Status Achieved   2/17  ? Target Date 04/16/21   ?  ? PT SHORT TERM GOAL #2  ? Title Decreased pain in the left hand by >=50% at EOD and in the morning.   ? Time 4   ? Period Weeks   ? Status Achieved   2/17  ? ?  ?  ? ?  ? ? ? ? PT Long Term Goals - 06/14/21 1016   ? ?  ? PT LONG TERM GOAL #1  ? Title Improved left shoulder functional ROM to allow pt to perform ADLS including dressing, grooming and OH activities with minimal difficulty.   ? Time 6   ? Period Weeks   ? Status On-going   still showing difficulty with raising shoulder to eye level and movements away from body w/o UT substitution  05/28/21- see flow sheet.  ? Target Date 07/09/21   ?  ? PT LONG TERM GOAL #2  ? Title Improved left UE strength to 4+/5 to allow  patient to peform ADLs including carrying, lifting and gardening.   ? Time 6   ? Period Weeks   ? Status On-going   06/07/21- see flow sheet.  ? Target Date 07/09/21   ?  ? PT LONG TERM GOAL #3  ? Title Pt to demonstrate full left elbow extension and be able to WB on elbow without pain.   ? Period Weeks   ? Status Achieved   ? Target Date 05/28/21   ?  ? PT LONG TERM GOAL #4  ? Title Improved left grip strength to at least 10# to improve function of left hand.   ? Time 8   ? Period Weeks   ? Status Achieved   ? Target Date 05/28/21   ?  ? PT LONG TERM GOAL #5  ? Title Ind with advanced HEP to maintain goals made in PT   ? Time 6   ? Period Weeks   ? Status Partially Met   05/28/21- met fo current, getting pulleys tomorrow.  ? Target Date 07/09/21   ? ?  ?  ? ?  ? ? ? ? ? ? ? ? Plan - 06/14/21 1050   ? ? Clinical Impression Statement Pt now is showing 113 deg of fwd flexion and 96 deg of ABD both with assistance from the R UE. She still demonstrates TTP and weakness with the external rotators of the shoulder. Cues required with ER to keep neutral alignment of the shoulder. Continue working on overhead reaches while minimizing UT engagement.   ? Personal Factors and Comorbidities Time since onset of injury/illness/exacerbation;Age   ? PT Frequency 2x / week   ? PT Duration 8 weeks   ? PT Treatment/Interventions ADLs/Self Care Home Management;Aquatic Therapy;Cryotherapy;Electrical Stimulation;Moist Heat;Therapeutic activities;Therapeutic exercise;Neuromuscular re-education;Manual techniques;Patient/family education;Passive range of motion;Dry needling;Taping;Vasopneumatic Device;Joint Manipulations;Spinal Manipulations   ? PT Next Visit Plan continue to focus on shoulder strength and ROM for functional goals.   ? PT Home Exercise Plan [email or web address]   ? Consulted and Agree with Plan of Care Patient   ? ?  ?  ? ?  ? ? ?  Patient will benefit from skilled therapeutic intervention in order to improve the following deficits and  impairments:  Decreased range of motion, Impaired UE functional use, Increased muscle spasms, Pain, Decreased activity tolerance, Postural dysfunction, Increased edema, Decreased strength, Impaired flexibility ? ?Visit Diagnosis: ?Stiffness of left shoulder, not elsewhere classified ? ?Stiffness of left elbow, not elsewhere classified ? ?Acute pain of left shoulder ? ?Muscle weakness (generalized) ? ?Pain in left hand ? ?Localized edema ? ?Pain in left elbow ? ? ? ? ?Problem List ?Patient Active Problem List  ? Diagnosis Date Noted  ? S/P ORIF (open reduction internal fixation) fracture 01/15/2021  ? Hyperkalemia 12/20/2020  ? Primary hyperparathyroidism ([place]) 12/20/2020  ? Need for immunization against influenza 12/20/2020  ? Hypertension 07/20/2020  ? Hyperlipidemia 07/20/2020  ? Osteoporosis 07/20/2020  ? Tobacco abuse 07/20/2020  ? ? ?Artist Pais, PTA ?06/14/2021, 12:02 PM ? ?[place] ?[facility] ?[address] ?[place], [place], [address] ?Phone: [phone]   Fax:  [phone] ? ?Name: Mikelle Myrick ?MRN: [id number] ?Date of Birth: [birth year] ? ? ? ?

## 2021-06-18 ENCOUNTER — Ambulatory Visit: Payer: Medicare Other | Admitting: Internal Medicine

## 2021-06-18 ENCOUNTER — Ambulatory Visit: Payer: Medicare Other

## 2021-06-24 ENCOUNTER — Encounter: Payer: Self-pay | Admitting: Physical Therapy

## 2021-06-24 ENCOUNTER — Ambulatory Visit: Payer: Medicare Other | Attending: Orthopedic Surgery | Admitting: Physical Therapy

## 2021-06-24 DIAGNOSIS — M25612 Stiffness of left shoulder, not elsewhere classified: Secondary | ICD-10-CM | POA: Diagnosis present

## 2021-06-24 DIAGNOSIS — M6281 Muscle weakness (generalized): Secondary | ICD-10-CM | POA: Diagnosis present

## 2021-06-24 DIAGNOSIS — M25622 Stiffness of left elbow, not elsewhere classified: Secondary | ICD-10-CM | POA: Diagnosis present

## 2021-06-24 DIAGNOSIS — M25512 Pain in left shoulder: Secondary | ICD-10-CM | POA: Diagnosis present

## 2021-06-24 NOTE — Therapy (Signed)
Progress Note ?Reporting Period 05/28/2021 to 06/24/2021 ? ?See note below for Objective Data and Assessment of Progress/Goals.  ? ? ? ?[place] ?[address] ?[place], [place], [address] ?Phone: [phone]   Fax:  [phone] ? ?Physical Therapy Treatment ? ?Patient Details  ?Name: Mary Fitzpatrick ?MRN: [id number] ?Date of Birth: [birth year] ?Referring Provider (PT): Justice Britain ? ? ?Encounter Date: 06/24/2021 ? ? PT End of Session - 06/24/21 1021   ? ? Visit Number 21   ? Date for PT Re-Evaluation 07/09/21   ? Authorization Type MCR   ? Progress Note Due on Visit 25   ? PT Start Time 1020   ? PT Stop Time 1100   ? PT Time Calculation (min) 40 min   ? Activity Tolerance Patient tolerated treatment well   ? Behavior During Therapy [facility] for tasks assessed/performed   ? ?  ?  ? ?  ? ? ?Past Medical History:  ?Diagnosis Date  ? Arthritis   ? Hyperlipidemia   ? Hypertension   ? Parathyroid abnormality ([place])   ? nodule on one of the glands  ? ? ?Past Surgical History:  ?Procedure Laterality Date  ? BREAST EXCISIONAL BIOPSY Left   ? BREAST EXCISIONAL BIOPSY Right   ? GASTRECTOMY    ? Due to a ruptured bleeding ulcer  ? LAPAROTOMY    ? exploratory kink in bowel due to water sking  ? ORIF HUMERUS FRACTURE Left 01/15/2021  ? Procedure: OPEN REDUCTION INTERNAL FIXATION (ORIF) PROXIMAL HUMERUS FRACTURE;  Surgeon: Justice Britain, MD;  Location: [facility];  Service: Orthopedics;  Laterality: Left;  ? TONSILLECTOMY    ? ? ?There were no vitals filed for this visit. ? ? Subjective Assessment - 06/24/21 1022   ? ? Subjective Just stiff today.  Has MD appt. on Wednesday with Dr. Onnie Graham.   ? Pertinent History HTN   ? Patient Stated Goals restore her ROM and strength   ? Currently in Pain? No/denies   ? ?  ?  ? ?  ? ? ? ? ? [facility] PT Assessment - 06/24/21 0001   ? ?  ? Assessment  ? Medical Diagnosis s/p ORIF left humeral fx   ? Referring Provider (PT) Justice Britain   ? Onset  Date/Surgical Date 01/15/21   ? Hand Dominance Right   ? Next MD Visit 06/26/21   ?  ? Precautions  ? Precautions None   ?  ? Restrictions  ? Weight Bearing Restrictions No   ?  ? AROM  ? Left Shoulder Flexion 87 Degrees   115 with prayer exercise  ? Left Shoulder ABduction 90 Degrees   ?  ? PROM  ? Left Shoulder Flexion 135 Degrees   ? Left Shoulder ABduction 125 Degrees   ? Left Shoulder Internal Rotation 60 Degrees   ? Left Shoulder External Rotation 45 Degrees   ?  ? Strength  ? Left Shoulder Flexion 3-/5   ? Left Shoulder ABduction 3-/5   ? Left Shoulder Internal Rotation 4+/5   ? Left Shoulder External Rotation 4+/5   ? ?  ?  ? ?  ? ? ? ? ? ? ? ? ? ? ? ? ? ? ? ? [facility] Adult PT Treatment/Exercise - 06/24/21 0001   ? ?  ? Shoulder Exercises: Supine  ? Flexion Strengthening;Both;20 reps;Weights   ? Shoulder Flexion Weight (lbs) 3#   ?  ? Shoulder Exercises: Standing  ?  External Rotation Strengthening;Left;10 reps;Theraband   ? Theraband Level (Shoulder External Rotation) Level 2 (Red)   ? Internal Rotation Strengthening;Left;Theraband;20 reps   ? Theraband Level (Shoulder Internal Rotation) Level 2 (Red)   ? Diagonals Strengthening;Left;10 reps;Theraband   ? Theraband Level (Shoulder Diagonals) Level 2 (Red)   ? Diagonals Limitations across and down 10, cross body across and down x 10   ? Other Standing Exercises 2# weight to first shelf x 10   ? Other Standing Exercises serratus slides with foam roll x 12   ?  ? Shoulder Exercises: ROM/Strengthening  ? UBE (Upper Arm Bike) L2.0 3 min fwd/ 3 min back   ? Other ROM/Strengthening Exercises prayer AAROM supported on wall x15   ?  ? Manual Therapy  ? Manual therapy comments to improve L shoulder ROM and decrease pain   ? Joint Mobilization P/A and A/P to left shoulder for assessment   ? Passive ROM into ER and IR   ? ?  ?  ? ?  ? ? ? ? ? ? ? ? ? ? ? ? PT Short Term Goals - 05/03/21 1212   ? ?  ? PT SHORT TERM GOAL #1  ? Title Ind with intial HEP   ? Time 2   ? Period  Weeks   ? Status Achieved   2/17  ? Target Date 04/16/21   ?  ? PT SHORT TERM GOAL #2  ? Title Decreased pain in the left hand by >=50% at EOD and in the morning.   ? Time 4   ? Period Weeks   ? Status Achieved   2/17  ? ?  ?  ? ?  ? ? ? ? PT Long Term Goals - 06/14/21 1016   ? ?  ? PT LONG TERM GOAL #1  ? Title Improved left shoulder functional ROM to allow pt to perform ADLS including dressing, grooming and OH activities with minimal difficulty.   ? Time 6   ? Period Weeks   ? Status On-going   still showing difficulty with raising shoulder to eye level and movements away from body w/o UT substitution  05/28/21- see flow sheet.  ? Target Date 07/09/21   ?  ? PT LONG TERM GOAL #2  ? Title Improved left UE strength to 4+/5 to allow patient to peform ADLs including carrying, lifting and gardening.   ? Time 6   ? Period Weeks   ? Status On-going   06/07/21- see flow sheet.  ? Target Date 07/09/21   ?  ? PT LONG TERM GOAL #3  ? Title Pt to demonstrate full left elbow extension and be able to WB on elbow without pain.   ? Period Weeks   ? Status Achieved   ? Target Date 05/28/21   ?  ? PT LONG TERM GOAL #4  ? Title Improved left grip strength to at least 10# to improve function of left hand.   ? Time 8   ? Period Weeks   ? Status Achieved   ? Target Date 05/28/21   ?  ? PT LONG TERM GOAL #5  ? Title Ind with advanced HEP to maintain goals made in PT   ? Time 6   ? Period Weeks   ? Status Partially Met   05/28/21- met fo current, getting pulleys tomorrow.  ? Target Date 07/09/21   ? ?  ?  ? ?  ? ? ? ? ? ? ? ?  Plan - 06/24/21 1102   ? ? Clinical Impression Statement Pt. is making progress, demonstrating improved fwd flexion and abd, but due to weakness in ant/lat delts still unable to actively raise L arm above 90 deg.  With assistance can get to 115 deg fwd flexion, and her PROM she has 135 deg flexion now.  Difficulty reaching overhead especially without engaging UT to compensate for deltoid weakness.  She would benefit  from continued skilled therapy.   ? Personal Factors and Comorbidities Time since onset of injury/illness/exacerbation;Age   ? PT Frequency 2x / week   ? PT Duration 8 weeks   ? PT Treatment/Interventions ADLs/Self Care Home Management;Aquatic Therapy;Cryotherapy;Electrical Stimulation;Moist Heat;Therapeutic activities;Therapeutic exercise;Neuromuscular re-education;Manual techniques;Patient/family education;Passive range of motion;Dry needling;Taping;Vasopneumatic Device;Joint Manipulations;Spinal Manipulations   ? PT Next Visit Plan continue to focus on shoulder strength and ROM for functional goals.   ? PT Home Exercise Plan [id number]   ? Consulted and Agree with Plan of Care Patient   ? ?  ?  ? ?  ? ? ?Patient will benefit from skilled therapeutic intervention in order to improve the following deficits and impairments:  Decreased range of motion, Impaired UE functional use, Increased muscle spasms, Pain, Decreased activity tolerance, Postural dysfunction, Increased edema, Decreased strength, Impaired flexibility ? ?Visit Diagnosis: ?Stiffness of left shoulder, not elsewhere classified ? ?Stiffness of left elbow, not elsewhere classified ? ?Acute pain of left shoulder ? ?Muscle weakness (generalized) ? ? ? ? ?Problem List ?Patient Active Problem List  ? Diagnosis Date Noted  ? S/P ORIF (open reduction internal fixation) fracture 01/15/2021  ? Hyperkalemia 12/20/2020  ? Primary hyperparathyroidism ([place]) 12/20/2020  ? Need for immunization against influenza 12/20/2020  ? Hypertension 07/20/2020  ? Hyperlipidemia 07/20/2020  ? Osteoporosis 07/20/2020  ? Tobacco abuse 07/20/2020  ? ? ?Rennie Natter, PT, DPT  ?06/24/2021, 11:18 AM ? ?[facility] ?[facility] ?[address] ?[place], [place], [address] ?Phone: [phone]   Fax:  [phone] ? ?Name: Mary Fitzpatrick ?MRN: [id number] ?Date of Birth: 08/22/1940 ? ? ? ?

## 2021-06-27 ENCOUNTER — Ambulatory Visit: Payer: Medicare Other | Admitting: Family

## 2021-06-27 ENCOUNTER — Ambulatory Visit: Payer: Medicare Other | Admitting: Physical Therapy

## 2021-06-27 ENCOUNTER — Encounter: Payer: Self-pay | Admitting: Physical Therapy

## 2021-06-27 DIAGNOSIS — M25612 Stiffness of left shoulder, not elsewhere classified: Secondary | ICD-10-CM

## 2021-06-27 DIAGNOSIS — M6281 Muscle weakness (generalized): Secondary | ICD-10-CM

## 2021-06-27 DIAGNOSIS — M25622 Stiffness of left elbow, not elsewhere classified: Secondary | ICD-10-CM

## 2021-06-27 DIAGNOSIS — M25512 Pain in left shoulder: Secondary | ICD-10-CM

## 2021-06-27 NOTE — Therapy (Signed)
[place] ?[facility] ?[address] ?[place], [place], [address] ?Phone: [phone]   Fax:  [phone] ? ?Physical Therapy Treatment ? ?Patient Details  ?Name: Mary Fitzpatrick ?MRN: [id number] ?Date of Birth: 1940/04/26 ?Referring Provider (PT): Justice Britain ? ? ?Encounter Date: 06/27/2021 ? ? PT End of Session - 06/27/21 1315   ? ? Visit Number 22   ? Date for PT Re-Evaluation 07/09/21   ? Authorization Type MCR   ? Progress Note Due on Visit 25   ? PT Start Time 1315   ? PT Stop Time 1402   ? PT Time Calculation (min) 47 min   ? Activity Tolerance Patient tolerated treatment well   ? Behavior During Therapy [facility] for tasks assessed/performed   ? ?  ?  ? ?  ? ? ?Past Medical History:  ?Diagnosis Date  ? Arthritis   ? Hyperlipidemia   ? Hypertension   ? Parathyroid abnormality ([place])   ? nodule on one of the glands  ? ? ?Past Surgical History:  ?Procedure Laterality Date  ? BREAST EXCISIONAL BIOPSY Left   ? BREAST EXCISIONAL BIOPSY Right   ? GASTRECTOMY    ? Due to a ruptured bleeding ulcer  ? LAPAROTOMY    ? exploratory kink in bowel due to water sking  ? ORIF HUMERUS FRACTURE Left 01/15/2021  ? Procedure: OPEN REDUCTION INTERNAL FIXATION (ORIF) PROXIMAL HUMERUS FRACTURE;  Surgeon: Justice Britain, MD;  Location: [facility];  Service: Orthopedics;  Laterality: Left;  ? TONSILLECTOMY    ? ? ?There were no vitals filed for this visit. ? ? Subjective Assessment - 06/27/21 1315   ? ? Subjective Pt. saw Dr. Onnie Graham yesterday, thought should continue therapy, especially stretching and strengthening.  No new imaging taken.   ? Pertinent History HTN   ? Patient Stated Goals restore her ROM and strength   ? Currently in Pain? No/denies   ? ?  ?  ? ?  ? ? ? ? ? ? ? ? ? ? ? ? ? ? ? ? ? ? ? ? [facility] Adult PT Treatment/Exercise - 06/27/21 0001   ? ?  ? Shoulder Exercises: ROM/Strengthening  ? UBE (Upper Arm Bike) L2.0 3 min fwd/ 3 min back   ? Wall Wash 10 reps flexion, x 10 scaption  with step in   ? Wall Pushups 20 reps   ? Other ROM/Strengthening Exercises IR wand behind back x 10, then IR stretch with towel 3 x 15 sec holds   ?  ? Manual Therapy  ? Manual Therapy Joint mobilization;Soft tissue mobilization;Passive ROM   ? Manual therapy comments to improve L shoulder ROM   ? Joint Mobilization joint mobs all directions to decrease capsule tightness   ? Soft tissue mobilization STM to pectoralis, UT   ? Passive ROM into all direction, L shoulder   ? ?  ?  ? ?  ? ? ? ? ? ? ? ? ? ? ? ? PT Short Term Goals - 05/03/21 1212   ? ?  ? PT SHORT TERM GOAL #1  ? Title Ind with intial HEP   ? Time 2   ? Period Weeks   ? Status Achieved   2/17  ? Target Date 04/16/21   ?  ? PT SHORT TERM GOAL #2  ? Title Decreased pain in the left hand by >=50% at EOD and in the morning.   ? Time 4   ?  Period Weeks   ? Status Achieved   2/17  ? ?  ?  ? ?  ? ? ? ? PT Long Term Goals - 06/14/21 1016   ? ?  ? PT LONG TERM GOAL #1  ? Title Improved left shoulder functional ROM to allow pt to perform ADLS including dressing, grooming and OH activities with minimal difficulty.   ? Time 6   ? Period Weeks   ? Status On-going   still showing difficulty with raising shoulder to eye level and movements away from body w/o UT substitution  05/28/21- see flow sheet.  ? Target Date 07/09/21   ?  ? PT LONG TERM GOAL #2  ? Title Improved left UE strength to 4+/5 to allow patient to peform ADLs including carrying, lifting and gardening.   ? Time 6   ? Period Weeks   ? Status On-going   06/07/21- see flow sheet.  ? Target Date 07/09/21   ?  ? PT LONG TERM GOAL #3  ? Title Pt to demonstrate full left elbow extension and be able to WB on elbow without pain.   ? Period Weeks   ? Status Achieved   ? Target Date 05/28/21   ?  ? PT LONG TERM GOAL #4  ? Title Improved left grip strength to at least 10# to improve function of left hand.   ? Time 8   ? Period Weeks   ? Status Achieved   ? Target Date 05/28/21   ?  ? PT LONG TERM GOAL #5  ? Title Ind  with advanced HEP to maintain goals made in PT   ? Time 6   ? Period Weeks   ? Status Partially Met   05/28/21- met fo current, getting pulleys tomorrow.  ? Target Date 07/09/21   ? ?  ?  ? ?  ? ? ? ? ? ? ? ? Plan - 06/27/21 1409   ? ? Clinical Impression Statement Pt. continues to demonstrate signficant tightness in L GH capsule, focused interventions today on progressing ROM both actively, then PROM and joint mobilization end of session.  No significant change in PROM with post test measurements today.   ? Personal Factors and Comorbidities Time since onset of injury/illness/exacerbation;Age   ? PT Frequency 2x / week   ? PT Duration 8 weeks   ? PT Treatment/Interventions ADLs/Self Care Home Management;Aquatic Therapy;Cryotherapy;Electrical Stimulation;Moist Heat;Therapeutic activities;Therapeutic exercise;Neuromuscular re-education;Manual techniques;Patient/family education;Passive range of motion;Dry needling;Taping;Vasopneumatic Device;Joint Manipulations;Spinal Manipulations   ? PT Next Visit Plan continue to focus on shoulder strength and ROM for functional goals.   ? PT Home Exercise Plan [id number]   ? Consulted and Agree with Plan of Care Patient   ? ?  ?  ? ?  ? ? ?Patient will benefit from skilled therapeutic intervention in order to improve the following deficits and impairments:  Decreased range of motion, Impaired UE functional use, Increased muscle spasms, Pain, Decreased activity tolerance, Postural dysfunction, Increased edema, Decreased strength, Impaired flexibility ? ?Visit Diagnosis: ?Stiffness of left shoulder, not elsewhere classified ? ?Stiffness of left elbow, not elsewhere classified ? ?Acute pain of left shoulder ? ?Muscle weakness (generalized) ? ? ? ? ?Problem List ?Patient Active Problem List  ? Diagnosis Date Noted  ? S/P ORIF (open reduction internal fixation) fracture 01/15/2021  ? Hyperkalemia 12/20/2020  ? Primary hyperparathyroidism ([place]) 12/20/2020  ? Need for immunization against  influenza 12/20/2020  ? Hypertension 07/20/2020  ? Hyperlipidemia 07/20/2020  ? Osteoporosis  07/20/2020  ? Tobacco abuse 07/20/2020  ? ? ?Rennie Natter, PT, DPT ?06/27/2021, 6:28 PM ? ?[place] ?[facility] ?[address] ?[place], [place], [address] ?Phone: [phone]   Fax:  [phone] ? ?Name: Mary Fitzpatrick ?MRN: [id number] ?Date of Birth: [birth year] ? ? ? ?

## 2021-06-28 ENCOUNTER — Encounter: Payer: Medicare Other | Admitting: Physical Therapy

## 2021-07-02 ENCOUNTER — Ambulatory Visit: Payer: Medicare Other

## 2021-07-02 DIAGNOSIS — M25612 Stiffness of left shoulder, not elsewhere classified: Secondary | ICD-10-CM | POA: Diagnosis not present

## 2021-07-02 DIAGNOSIS — M25622 Stiffness of left elbow, not elsewhere classified: Secondary | ICD-10-CM

## 2021-07-02 DIAGNOSIS — M25512 Pain in left shoulder: Secondary | ICD-10-CM

## 2021-07-02 DIAGNOSIS — M6281 Muscle weakness (generalized): Secondary | ICD-10-CM

## 2021-07-02 NOTE — Therapy (Signed)
[place] ?[facility] ?[address] ?[place], [place], [address] ?Phone: [phone]   Fax:  [phone] ? ?Physical Therapy Treatment ? ?Patient Details  ?Name: Mary Fitzpatrick ?MRN: [id number] ?Date of Birth: 24-May-1940 ?Referring Provider (PT): Justice Britain ? ? ?Encounter Date: 07/02/2021 ? ? PT End of Session - 07/02/21 1156   ? ? Visit Number 23   ? Date for PT Re-Evaluation 07/09/21   ? Authorization Type MCR   ? Progress Note Due on Visit 25   ? PT Start Time 1106   ? PT Stop Time 1147   ? PT Time Calculation (min) 41 min   ? Activity Tolerance Patient tolerated treatment well   ? Behavior During Therapy [facility] for tasks assessed/performed   ? ?  ?  ? ?  ? ? ?Past Medical History:  ?Diagnosis Date  ? Arthritis   ? Hyperlipidemia   ? Hypertension   ? Parathyroid abnormality ([place])   ? nodule on one of the glands  ? ? ?Past Surgical History:  ?Procedure Laterality Date  ? BREAST EXCISIONAL BIOPSY Left   ? BREAST EXCISIONAL BIOPSY Right   ? GASTRECTOMY    ? Due to a ruptured bleeding ulcer  ? LAPAROTOMY    ? exploratory kink in bowel due to water sking  ? ORIF HUMERUS FRACTURE Left 01/15/2021  ? Procedure: OPEN REDUCTION INTERNAL FIXATION (ORIF) PROXIMAL HUMERUS FRACTURE;  Surgeon: Justice Britain, MD;  Location: [facility];  Service: Orthopedics;  Laterality: Left;  ? TONSILLECTOMY    ? ? ?There were no vitals filed for this visit. ? ? Subjective Assessment - 07/02/21 1113   ? ? Subjective Still doing the same.   ? Pertinent History HTN   ? Patient Stated Goals restore her ROM and strength   ? Currently in Pain? No/denies   ? ?  ?  ? ?  ? ? ? ? ? ? ? ? ? ? ? ? ? ? ? ? ? ? ? ? [facility] Adult PT Treatment/Exercise - 07/02/21 0001   ? ?  ? Shoulder Exercises: Seated  ? Other Seated Exercises shoulder flexion with thoracic ext over back of chair x 10   ?  ? Shoulder Exercises: ROM/Strengthening  ? UBE (Upper Arm Bike) L2.5 3 min fwd/ 3 min back   ? Wall Wash flex x 10 reps with  thoracic ext; 1 set B UE; 2nd set L UE   ? Other ROM/Strengthening Exercises L shoulder flexion AAROM with green yoga ball on elevated mat x 20   ? Other ROM/Strengthening Exercises L side open book x 10   ?  ? Manual Therapy  ? Manual Therapy Passive ROM;Muscle Energy Technique   ? Passive ROM into all direction L shoulder with prolonged holds   ? Muscle Energy Technique contract + relax flex and ER   ? ?  ?  ? ?  ? ? ? ? ? ? ? ? ? ? ? ? PT Short Term Goals - 05/03/21 1212   ? ?  ? PT SHORT TERM GOAL #1  ? Title Ind with intial HEP   ? Time 2   ? Period Weeks   ? Status Achieved   2/17  ? Target Date 04/16/21   ?  ? PT SHORT TERM GOAL #2  ? Title Decreased pain in the left hand by >=50% at EOD and in the morning.   ? Time 4   ? Period Weeks   ?  Status Achieved   2/17  ? ?  ?  ? ?  ? ? ? ? PT Long Term Goals - 06/14/21 1016   ? ?  ? PT LONG TERM GOAL #1  ? Title Improved left shoulder functional ROM to allow pt to perform ADLS including dressing, grooming and OH activities with minimal difficulty.   ? Time 6   ? Period Weeks   ? Status On-going   still showing difficulty with raising shoulder to eye level and movements away from body w/o UT substitution  05/28/21- see flow sheet.  ? Target Date 07/09/21   ?  ? PT LONG TERM GOAL #2  ? Title Improved left UE strength to 4+/5 to allow patient to peform ADLs including carrying, lifting and gardening.   ? Time 6   ? Period Weeks   ? Status On-going   06/07/21- see flow sheet.  ? Target Date 07/09/21   ?  ? PT LONG TERM GOAL #3  ? Title Pt to demonstrate full left elbow extension and be able to WB on elbow without pain.   ? Period Weeks   ? Status Achieved   ? Target Date 05/28/21   ?  ? PT LONG TERM GOAL #4  ? Title Improved left grip strength to at least 10# to improve function of left hand.   ? Time 8   ? Period Weeks   ? Status Achieved   ? Target Date 05/28/21   ?  ? PT LONG TERM GOAL #5  ? Title Ind with advanced HEP to maintain goals made in PT   ? Time 6   ? Period  Weeks   ? Status Partially Met   05/28/21- met fo current, getting pulleys tomorrow.  ? Target Date 07/09/21   ? ?  ?  ? ?  ? ? ? ? ? ? ? ? Plan - 07/02/21 1156   ? ? Clinical Impression Statement Pt still presents with tightness going into OH movements. Focused session on improving L shoulder ROM while also facilitating normal scapulohumeral mechanics. Cuing with all exercises to prevent any compensation. By the end of session she demonstrated increased functional ROM OH although formal measurements not taken today.   ? Personal Factors and Comorbidities Time since onset of injury/illness/exacerbation;Age   ? PT Frequency 2x / week   ? PT Duration 8 weeks   ? PT Treatment/Interventions ADLs/Self Care Home Management;Aquatic Therapy;Cryotherapy;Electrical Stimulation;Moist Heat;Therapeutic activities;Therapeutic exercise;Neuromuscular re-education;Manual techniques;Patient/family education;Passive range of motion;Dry needling;Taping;Vasopneumatic Device;Joint Manipulations;Spinal Manipulations   ? PT Next Visit Plan continue to focus on shoulder strength and ROM for functional goals.   ? PT Home Exercise Plan [id number]   ? Consulted and Agree with Plan of Care Patient   ? ?  ?  ? ?  ? ? ?Patient will benefit from skilled therapeutic intervention in order to improve the following deficits and impairments:  Decreased range of motion, Impaired UE functional use, Increased muscle spasms, Pain, Decreased activity tolerance, Postural dysfunction, Increased edema, Decreased strength, Impaired flexibility ? ?Visit Diagnosis: ?Stiffness of left shoulder, not elsewhere classified ? ?Stiffness of left elbow, not elsewhere classified ? ?Acute pain of left shoulder ? ?Muscle weakness (generalized) ? ? ? ? ?Problem List ?Patient Active Problem List  ? Diagnosis Date Noted  ? S/P ORIF (open reduction internal fixation) fracture 01/15/2021  ? Hyperkalemia 12/20/2020  ? Primary hyperparathyroidism ([place]) 12/20/2020  ? Need for  immunization against influenza 12/20/2020  ? Hypertension 07/20/2020  ?  Hyperlipidemia 07/20/2020  ? Osteoporosis 07/20/2020  ? Tobacco abuse 07/20/2020  ? ? ?Artist Pais, PTA ?07/02/2021, 12:13 PM ? ?[facility] ?[facility] ?[address] ?[place], [place], [address] ?Phone: [phone]   Fax:  [phone] ? ?Name: Oneal Biglow ?MRN: [id number] ?Date of Birth: 10-31-1940 ? ? ? ?

## 2021-07-05 ENCOUNTER — Ambulatory Visit: Payer: Medicare Other | Admitting: Physical Therapy

## 2021-07-05 ENCOUNTER — Encounter: Payer: Self-pay | Admitting: Physical Therapy

## 2021-07-05 DIAGNOSIS — M25512 Pain in left shoulder: Secondary | ICD-10-CM

## 2021-07-05 DIAGNOSIS — M25612 Stiffness of left shoulder, not elsewhere classified: Secondary | ICD-10-CM

## 2021-07-05 DIAGNOSIS — M6281 Muscle weakness (generalized): Secondary | ICD-10-CM

## 2021-07-05 DIAGNOSIS — M25622 Stiffness of left elbow, not elsewhere classified: Secondary | ICD-10-CM

## 2021-07-05 NOTE — Therapy (Signed)
[place] ?[facility] ?[address] ?[place], [place], [address] ?Phone: [phone]   Fax:  [phone] ? ?Physical Therapy Treatment ? ?Patient Details  ?Name: Mary Fitzpatrick ?MRN: [id number] ?Date of Birth: February 13, 1941 ?Referring Provider (PT): Justice Britain ? ? ?Encounter Date: 07/05/2021 ? ? PT End of Session - 07/05/21 1108   ? ? Visit Number 24   ? Date for PT Re-Evaluation 07/09/21   ? Authorization Type MCR   ? Progress Note Due on Visit 25   ? PT Start Time 1105   ? PT Stop Time 1146   ? PT Time Calculation (min) 41 min   ? Activity Tolerance Patient tolerated treatment well   ? Behavior During Therapy [facility] for tasks assessed/performed   ? ?  ?  ? ?  ? ? ?Past Medical History:  ?Diagnosis Date  ? Arthritis   ? Hyperlipidemia   ? Hypertension   ? Parathyroid abnormality ([place])   ? nodule on one of the glands  ? ? ?Past Surgical History:  ?Procedure Laterality Date  ? BREAST EXCISIONAL BIOPSY Left   ? BREAST EXCISIONAL BIOPSY Right   ? GASTRECTOMY    ? Due to a ruptured bleeding ulcer  ? LAPAROTOMY    ? exploratory kink in bowel due to water sking  ? ORIF HUMERUS FRACTURE Left 01/15/2021  ? Procedure: OPEN REDUCTION INTERNAL FIXATION (ORIF) PROXIMAL HUMERUS FRACTURE;  Surgeon: Justice Britain, MD;  Location: [facility];  Service: Orthopedics;  Laterality: Left;  ? TONSILLECTOMY    ? ? ?There were no vitals filed for this visit. ? ? Subjective Assessment - 07/05/21 1107   ? ? Subjective Still doing the same. "It'll be nice when I can do my hair again, that wears me out."   ? Pertinent History HTN   ? Patient Stated Goals restore her ROM and strength   ? Currently in Pain? Yes   ? ?  ?  ? ?  ? ? ? ? ? [facility] PT Assessment - 07/05/21 0001   ? ?  ? PROM  ? Left Shoulder Flexion 135 Degrees   ? ?  ?  ? ?  ? ? ? ? ? ? ? ? ? ? ? ? ? ? ? ? [facility] PT Treatment/Exercise - 07/05/21 0001   ? ?  ? Shoulder Exercises: Standing  ? Flexion Left;20 reps   ? Shoulder Flexion Weight  (lbs) 1   ? Flexion Limitations 2 x 10 with 1# weight to 1st shelf, 2 x 8 no weight to second shelf   ?  ? Shoulder Exercises: ROM/Strengthening  ? UBE (Upper Arm Bike) L2.5 3 min fwd/ 3 min back   ? Wall Wash flexion x 15 reps at wall with thoracic extension, arcs on wall x 15   ?  ? Manual Therapy  ? Manual Therapy Joint mobilization;Soft tissue mobilization;Passive ROM   ? Manual therapy comments to improve L shoulder ROM   ? Joint Mobilization joint mobs all directions to decrease capsule tightness   ? Soft tissue mobilization STM to pectoralis, UT   ? Passive ROM into all direction L shoulder with prolonged holds   ? ?  ?  ? ?  ? ? ? ? ? ? ? ? ? ? ? ? PT Short Term Goals - 05/03/21 1212   ? ?  ? PT SHORT TERM GOAL #1  ? Title Ind with intial HEP   ? Time 2   ?  Period Weeks   ? Status Achieved   2/17  ? Target Date 04/16/21   ?  ? PT SHORT TERM GOAL #2  ? Title Decreased pain in the left hand by >=50% at EOD and in the morning.   ? Time 4   ? Period Weeks   ? Status Achieved   2/17  ? ?  ?  ? ?  ? ? ? ? PT Long Term Goals - 06/14/21 1016   ? ?  ? PT LONG TERM GOAL #1  ? Title Improved left shoulder functional ROM to allow pt to perform ADLS including dressing, grooming and OH activities with minimal difficulty.   ? Time 6   ? Period Weeks   ? Status On-going   still showing difficulty with raising shoulder to eye level and movements away from body w/o UT substitution  05/28/21- see flow sheet.  ? Target Date 07/09/21   ?  ? PT LONG TERM GOAL #2  ? Title Improved left UE strength to 4+/5 to allow patient to peform ADLs including carrying, lifting and gardening.   ? Time 6   ? Period Weeks   ? Status On-going   06/07/21- see flow sheet.  ? Target Date 07/09/21   ?  ? PT LONG TERM GOAL #3  ? Title Pt to demonstrate full left elbow extension and be able to WB on elbow without pain.   ? Period Weeks   ? Status Achieved   ? Target Date 05/28/21   ?  ? PT LONG TERM GOAL #4  ? Title Improved left grip strength to at least  10# to improve function of left hand.   ? Time 8   ? Period Weeks   ? Status Achieved   ? Target Date 05/28/21   ?  ? PT LONG TERM GOAL #5  ? Title Ind with advanced HEP to maintain goals made in PT   ? Time 6   ? Period Weeks   ? Status Partially Met   05/28/21- met fo current, getting pulleys tomorrow.  ? Target Date 07/09/21   ? ?  ?  ? ?  ? ? ? ? ? ? ? ? Plan - 07/05/21 1156   ? ? Clinical Impression Statement Patient is reporting more muscle fatigue with overhead movements and still demonstrates decreased strenth in anterior/lateral deltoids, as she does have functional ROM in L shoulder now.  Encouraged to continue working at home with wall washes and overhead reaches frequently to build strength.  Manual therapy at end of session to improve ROM, noted continued capsule tightness, no significant change following.   ? Personal Factors and Comorbidities Time since onset of injury/illness/exacerbation;Age   ? PT Frequency 2x / week   ? PT Duration 8 weeks   ? PT Treatment/Interventions ADLs/Self Care Home Management;Aquatic Therapy;Cryotherapy;Electrical Stimulation;Moist Heat;Therapeutic activities;Therapeutic exercise;Neuromuscular re-education;Manual techniques;Patient/family education;Passive range of motion;Dry needling;Taping;Vasopneumatic Device;Joint Manipulations;Spinal Manipulations   ? PT Next Visit Plan continue to focus on shoulder strength and ROM for functional goals.   ? PT Home Exercise Plan [email or web address]   ? Consulted and Agree with Plan of Care Patient   ? ?  ?  ? ?  ? ? ?Patient will benefit from skilled therapeutic intervention in order to improve the following deficits and impairments:  Decreased range of motion, Impaired UE functional use, Increased muscle spasms, Pain, Decreased activity tolerance, Postural dysfunction, Increased edema, Decreased strength, Impaired flexibility ? ?Visit Diagnosis: ?Stiffness of left shoulder, not  elsewhere classified ? ?Stiffness of left elbow, not elsewhere  classified ? ?Acute pain of left shoulder ? ?Muscle weakness (generalized) ? ? ? ? ?Problem List ?Patient Active Problem List  ? Diagnosis Date Noted  ? S/P ORIF (open reduction internal fixation) fracture 01/15/2021  ? Hyperkalemia 12/20/2020  ? Primary hyperparathyroidism ([place]) 12/20/2020  ? Need for immunization against influenza 12/20/2020  ? Hypertension 07/20/2020  ? Hyperlipidemia 07/20/2020  ? Osteoporosis 07/20/2020  ? Tobacco abuse 07/20/2020  ? ? ?Rennie Natter, PT, DPT  ?07/05/2021, 11:58 AM ? ?[place] ?[facility] ?[place] ?[place], [place], [address] ?Phone: [phone]   Fax:  [phone] ? ?Name: Mary Fitzpatrick ?MRN: [id number] ?Date of Birth: 1940/04/01 ? ? ? ?

## 2021-07-09 ENCOUNTER — Ambulatory Visit: Payer: Medicare Other

## 2021-07-09 DIAGNOSIS — M25512 Pain in left shoulder: Secondary | ICD-10-CM

## 2021-07-09 DIAGNOSIS — M6281 Muscle weakness (generalized): Secondary | ICD-10-CM

## 2021-07-09 DIAGNOSIS — M25612 Stiffness of left shoulder, not elsewhere classified: Secondary | ICD-10-CM

## 2021-07-09 DIAGNOSIS — M25622 Stiffness of left elbow, not elsewhere classified: Secondary | ICD-10-CM

## 2021-07-09 NOTE — Addendum Note (Signed)
Addended by: Jena Gauss on: 07/09/2021 12:12 PM ? ? Modules accepted: Orders ? ?

## 2021-07-09 NOTE — Therapy (Addendum)
[place] ?[facility] ?[address] ?[place], [place], [address] ?Phone: [phone]   Fax:  [phone] ? ?Physical Therapy Treatment/Progress Note ? ?Progress Note ?Reporting Period 06/24/2021 to 07/09/2021 ? ?See note below for Objective Data and Assessment of Progress/Goals.  ? ?Mary Fitzpatrick has made progress so far, although slow, her ROM and strength does indicate improvement. She would benefit from continued PT for an additional 2x a week for 6 weeks to address functional deficits with ROM and strength to restore PLOF.  ? ?Rennie Natter, PT, DPT  ?  ?Patient Details  ?Name: Mary Fitzpatrick ?MRN: [id number] ?Date of Birth: 05/03/1940 ?Referring Provider (PT): Justice Britain ? ? ?Encounter Date: 07/09/2021 ? ? PT End of Session - 07/09/21 1110   ? ? Visit Number 25   ? Date for PT Re-Evaluation 08/20/21   ? Authorization Type MCR   ? Progress Note Due on Visit 25   ? PT Start Time 1017   ? PT Stop Time 1102   ? PT Time Calculation (min) 45 min   ? Activity Tolerance Patient tolerated treatment well   ? Behavior During Therapy [facility] for tasks assessed/performed   ? ?  ?  ? ?  ? ? ?Past Medical History:  ?Diagnosis Date  ? Arthritis   ? Hyperlipidemia   ? Hypertension   ? Parathyroid abnormality ([place])   ? nodule on one of the glands  ? ? ?Past Surgical History:  ?Procedure Laterality Date  ? BREAST EXCISIONAL BIOPSY Left   ? BREAST EXCISIONAL BIOPSY Right   ? GASTRECTOMY    ? Due to a ruptured bleeding ulcer  ? LAPAROTOMY    ? exploratory kink in bowel due to water sking  ? ORIF HUMERUS FRACTURE Left 01/15/2021  ? Procedure: OPEN REDUCTION INTERNAL FIXATION (ORIF) PROXIMAL HUMERUS FRACTURE;  Surgeon: Justice Britain, MD;  Location: [facility];  Service: Orthopedics;  Laterality: Left;  ? TONSILLECTOMY    ? ? ?There were no vitals filed for this visit. ? ? Subjective Assessment - 07/09/21 1019   ? ? Subjective Feeling cold and stiff today.   ? Pertinent History HTN    ? Patient Stated Goals restore her ROM and strength   ? Currently in Pain? No/denies   ? ?  ?  ? ?  ? ? ? ? ? [facility] PT Assessment - 07/09/21 0001   ? ?  ? Assessment  ? Medical Diagnosis s/p ORIF left humeral fx   ? Referring Provider (PT) Justice Britain   ? Onset Date/Surgical Date 01/15/21   ?  ? Observation/Other Assessments  ? Focus on Therapeutic Outcomes (FOTO)  shoulder 63%   ?  ? AROM  ? Left Shoulder Flexion 100 Degrees   ? Left Shoulder ABduction 108 Degrees   scaption  ? Left Shoulder Internal Rotation --   FIR to sacrum  ? Left Shoulder External Rotation --   FER to C7  ?  ? Strength  ? Left Shoulder Flexion 3+/5   ? Left Shoulder ABduction 3+/5   ? Left Shoulder Internal Rotation 4+/5   ? Left Shoulder External Rotation 4-/5   ? ?  ?  ? ?  ? ? ? ? ? ? ? ? ? ? ? ? ? ? ? ? [facility] Adult PT Treatment/Exercise - 07/09/21 0001   ? ?  ? Shoulder Exercises: Standing  ? Other Standing Exercises cabinet reaches to 2nd level B con/L UE ecc  x 10 fwd; scaption 1lb with L UE x 10 midway btw 1st and 2nd shelf   ?  ? Shoulder Exercises: Pulleys  ? Flexion 2 minutes   ? Scaption 2 minutes   ?  ? Shoulder Exercises: ROM/Strengthening  ? UBE (Upper Arm Bike) L3.0 2 min fwd/ 2 min back   ?  ? Shoulder Exercises: Stretch  ? Other Shoulder Stretches L shoulder IR with towel behind back 10x3"   ? ?  ?  ? ?  ? ? ? ? ? ? ? ? ? ? ? ? PT Short Term Goals - 05/03/21 1212   ? ?  ? PT SHORT TERM GOAL #1  ? Title Ind with intial HEP   ? Time 2   ? Period Weeks   ? Status Achieved   2/17  ? Target Date 04/16/21   ?  ? PT SHORT TERM GOAL #2  ? Title Decreased pain in the left hand by >=50% at EOD and in the morning.   ? Time 4   ? Period Weeks   ? Status Achieved   2/17  ? ?  ?  ? ?  ? ? ? ? PT Long Term Goals - 07/09/21 1111   ? ?  ? PT LONG TERM GOAL #1  ? Title Improved left shoulder functional ROM to allow pt to perform ADLS including dressing, grooming and OH activities with minimal difficulty.   ? Time 6   ? Period Weeks   ? Status  Partially Met   still difficulty with reaching behind back to dry off with towel and reaching OH with weight  ? Target Date 08/20/21   ?  ? PT LONG TERM GOAL #2  ? Title Improved left UE strength to 4+/5 to allow patient to peform ADLs including carrying, lifting and gardening.   ? Time 6   ? Period Weeks   ? Status On-going   07/09/21- see flow sheet.  ? Target Date 08/20/21   ?  ? PT LONG TERM GOAL #3  ? Title Pt to demonstrate full left elbow extension and be able to WB on elbow without pain.   ? Period Weeks   ? Status Achieved   ? Target Date 05/28/21   ?  ? PT LONG TERM GOAL #4  ? Title Improved left grip strength to at least 10# to improve function of left hand.   ? Time 8   ? Period Weeks   ? Status Achieved   ? Target Date 05/28/21   ?  ? PT LONG TERM GOAL #5  ? Title Ind with advanced HEP to maintain goals made in PT   ? Time 6   ? Period Weeks   ? Status Partially Met   05/28/21- met fo current, getting pulleys tomorrow.  ? Target Date 08/20/21   ? ?  ?  ? ?  ? ? ? ? ? ? ? ? Plan - 07/09/21 1156   ? ? Clinical Impression Statement Pt demonstrates an improved ability with OH reaching into shelves, however now noted more difficulty with grabbing weighted objects from a shelf OH. L shoulder flexion and abd has improved. Pt does show limitations with FER/FIR which limits her ability to dry herself off with bath towel. L shoulder strength still remains limited with flex, ABD, and ER. Status with remaining goals now partially met, other goals are met. Pt still shows compensation with UT when elevating shoulder, which probably is due to  capsular tightness.   ? Personal Factors and Comorbidities Time since onset of injury/illness/exacerbation;Age   ? PT Frequency 2x / week   ? PT Duration 6 weeks   ? PT Treatment/Interventions ADLs/Self Care Home Management;Aquatic Therapy;Cryotherapy;Electrical Stimulation;Moist Heat;Therapeutic activities;Therapeutic exercise;Neuromuscular re-education;Manual  techniques;Patient/family education;Passive range of motion;Dry needling;Taping;Vasopneumatic Device;Joint Manipulations;Spinal Manipulations   ? PT Next Visit Plan continue to focus on shoulder strength and ROM for functional goals.   ? PT Home Exercise Plan [email or web address]   ? Consulted and Agree with Plan of Care Patient   ? ?  ?  ? ?  ? ? ?Patient will benefit from skilled therapeutic intervention in order to improve the following deficits and impairments:  Decreased range of motion, Impaired UE functional use, Increased muscle spasms, Pain, Decreased activity tolerance, Postural dysfunction, Increased edema, Decreased strength, Impaired flexibility ? ?Visit Diagnosis: ?Stiffness of left shoulder, not elsewhere classified ? ?Stiffness of left elbow, not elsewhere classified ? ?Acute pain of left shoulder ? ?Muscle weakness (generalized) ? ? ? ? ?Problem List ?Patient Active Problem List  ? Diagnosis Date Noted  ? S/P ORIF (open reduction internal fixation) fracture 01/15/2021  ? Hyperkalemia 12/20/2020  ? Primary hyperparathyroidism ([place]) 12/20/2020  ? Need for immunization against influenza 12/20/2020  ? Hypertension 07/20/2020  ? Hyperlipidemia 07/20/2020  ? Osteoporosis 07/20/2020  ? Tobacco abuse 07/20/2020  ? ? ?Artist Pais, PTA ?07/09/2021, 12:05 PM ? ?[place] ?[facility] ?[address] ?[place], [place], [address] ?Phone: [phone]   Fax:  [phone] ? ?Name: Mary Fitzpatrick ?MRN: [id number] ?Date of Birth: 1940/08/20 ? ? ? ?

## 2021-07-09 NOTE — Patient Instructions (Signed)
Access Code: [email or web address] ?URL: [email or web address] ?Date: 07/09/2021 ?Prepared by: Verta Ellen ? ?Exercises ?- Shoulder Flexion Wall Slide with Towel (Mirrored)  - 2 x daily - 7 x weekly - 2 sets - 10 reps ?- Sleeper Stretch  - 2 x daily - 7 x weekly - 1 sets - 3 reps - 30-60 sec hold ?- Supine Shoulder Flexion Extension AAROM with Dowel  - 1 x daily - 7 x weekly - 3 sets - 10 reps ?- Shoulder External Rotation with Anchored Resistance  - 1 x daily - 7 x weekly - 3 sets - 10 reps ?- Standing Bilateral Low Shoulder Row with Anchored Resistance  - 1 x daily - 7 x weekly - 3 sets - 10 reps ?- Shoulder extension with resistance - Neutral  - 1 x daily - 7 x weekly - 3 sets - 10 reps ?- Sidelying Shoulder Horizontal Abduction  - 1 x daily - 3-4 x weekly - 1-3 sets - 10 reps ?- Standing Bilateral Shoulder Internal Rotation AAROM with Dowel  - 1 x daily - 7 x weekly - 3 sets - 10 reps ?- Scaption Wall Slide with Towel  - 1 x daily - 7 x weekly - 3 sets - 10 reps ?

## 2021-07-11 ENCOUNTER — Encounter: Payer: Self-pay | Admitting: Family

## 2021-07-11 ENCOUNTER — Ambulatory Visit (INDEPENDENT_AMBULATORY_CARE_PROVIDER_SITE_OTHER): Payer: Medicare Other | Admitting: Family

## 2021-07-11 VITALS — BP 122/88 | HR 77 | Temp 97.4°F | Ht 67.0 in | Wt 117.6 lb

## 2021-07-11 DIAGNOSIS — I10 Essential (primary) hypertension: Secondary | ICD-10-CM | POA: Diagnosis not present

## 2021-07-11 DIAGNOSIS — Z1231 Encounter for screening mammogram for malignant neoplasm of breast: Secondary | ICD-10-CM | POA: Diagnosis not present

## 2021-07-11 DIAGNOSIS — E782 Mixed hyperlipidemia: Secondary | ICD-10-CM | POA: Diagnosis not present

## 2021-07-11 MED ORDER — ATORVASTATIN CALCIUM 20 MG PO TABS: 20.0000 mg | ORAL_TABLET | Freq: Every morning | ORAL | 3 refills | Status: DC

## 2021-07-11 MED ORDER — AMLODIPINE BESYLATE 2.5 MG PO TABS: 2.5000 mg | ORAL_TABLET | Freq: Every day | ORAL | 3 refills | Status: DC

## 2021-07-11 NOTE — Progress Notes (Signed)
?Mary Fitzpatrick is a 81 y.o. female with the following history as recorded in EpicCare:  ?Patient Active Problem List  ? Diagnosis Date Noted  ? S/P ORIF (open reduction internal fixation) fracture 01/15/2021  ? Hyperkalemia 12/20/2020  ? Primary hyperparathyroidism ([place]) 12/20/2020  ? Need for immunization against influenza 12/20/2020  ? Hypertension 07/20/2020  ? Hyperlipidemia 07/20/2020  ? Osteoporosis 07/20/2020  ? Tobacco abuse 07/20/2020  ?  ?Current Outpatient Medications  ?Medication Sig Dispense Refill  ? cholecalciferol (VITAMIN D3) 25 MCG (1000 UNIT) tablet Take 1,000 Units by mouth daily.    ? amLODipine (NORVASC) 2.5 MG tablet Take 1 tablet (2.5 mg total) by mouth daily. 90 tablet 3  ? atorvastatin (LIPITOR) 20 MG tablet Take 1 tablet (20 mg total) by mouth every morning. 90 tablet 3  ? ?No current facility-administered medications for this visit.  ?  ?Allergies: Hctz [hydrochlorothiazide]  ?Past Medical History:  ?Diagnosis Date  ? Arthritis   ? Hyperlipidemia   ? Hypertension   ? Parathyroid abnormality ([place])   ? nodule on one of the glands  ?  ?Past Surgical History:  ?Procedure Laterality Date  ? BREAST EXCISIONAL BIOPSY Left   ? BREAST EXCISIONAL BIOPSY Right   ? GASTRECTOMY    ? Due to a ruptured bleeding ulcer  ? LAPAROTOMY    ? exploratory kink in bowel due to water sking  ? ORIF HUMERUS FRACTURE Left 01/15/2021  ? Procedure: OPEN REDUCTION INTERNAL FIXATION (ORIF) PROXIMAL HUMERUS FRACTURE;  Surgeon: Justice Britain, MD;  Location: [facility];  Service: Orthopedics;  Laterality: Left;  ? TONSILLECTOMY    ?  ?No family history on file.  ?Social History  ? ?Tobacco Use  ? Smoking status: Some Days  ?  Years: 40.00  ?  Types: Cigarettes  ? Smokeless tobacco: Never  ?Substance Use Topics  ? Alcohol use: Yes  ?  Alcohol/week: 2.0 standard drinks  ?  Types: 2 Glasses of wine per week  ?  Comment: most nights  ?  ?Subjective:  ? ?Follow up on hypertension and hyperlipidemia; currently on Amlodipine 2.5 mg  daily; tolerating well; ?Does need refill on medication;  ? ? ? ?Objective:  ?Vitals:  ? 07/11/21 1259  ?BP: 122/88  ?Pulse: 77  ?Temp: (!) 97.4 ?F (36.3 ?C)  ?TempSrc: Oral  ?SpO2: 98%  ?Weight: 117 lb 9.6 oz (53.3 kg)  ?Height: _0  (1.702 m)  ?  ?General: Well developed, well nourished, in no acute distress  ?Skin : Warm and dry.  ?Head: Normocephalic and atraumatic  ?Lungs: Respirations unlabored; clear to auscultation bilaterally without wheeze, rales, rhonchi  ?CVS exam: normal rate and regular rhythm.  ?Neurologic: Alert and oriented; speech intact; face symmetrical; moves all extremities well; CNII-XII intact without focal deficit  ? ?Assessment:  ?1. Primary hypertension   ?2. Visit for screening mammogram   ?3. Mixed hyperlipidemia   ?  ?Plan:  ?Stable; refill updated; ?Order for screening mammogram updated; ?Stable; refill updated;  ? ?Discussed lung cancer CT screen and patient defers at this time; discussed repeat bone density due to recent fracture and she defers at this time;  ? ? ? ?No follow-ups on file.  ?Orders Placed This Encounter  ?Procedures  ? MM Digital Screening  ?  Standing Status:   Future  ?  Standing Expiration Date:   07/12/2022  ?  Order Specific Question:   Reason for Exam (SYMPTOM  OR DIAGNOSIS REQUIRED)  ?  Answer:   screening mammogram  ?  Order Specific Question:   Preferred imaging location?  ?  Answer:   [facility]  ? CBC with Differential/Platelet  ? Comp Met (CMET)  ? Lipid panel  ?  ?Requested Prescriptions  ? ?Signed Prescriptions Disp Refills  ? amLODipine (NORVASC) 2.5 MG tablet 90 tablet 3  ?  Sig: Take 1 tablet (2.5 mg total) by mouth daily.  ? atorvastatin (LIPITOR) 20 MG tablet 90 tablet 3  ?  Sig: Take 1 tablet (20 mg total) by mouth every morning.  ?  ? ?

## 2021-07-12 ENCOUNTER — Other Ambulatory Visit: Payer: Self-pay | Admitting: Family

## 2021-07-12 ENCOUNTER — Ambulatory Visit: Payer: Medicare Other | Admitting: Physical Therapy

## 2021-07-12 ENCOUNTER — Encounter: Payer: Self-pay | Admitting: Physical Therapy

## 2021-07-12 DIAGNOSIS — M25612 Stiffness of left shoulder, not elsewhere classified: Secondary | ICD-10-CM | POA: Diagnosis not present

## 2021-07-12 DIAGNOSIS — M6281 Muscle weakness (generalized): Secondary | ICD-10-CM

## 2021-07-12 DIAGNOSIS — M25512 Pain in left shoulder: Secondary | ICD-10-CM

## 2021-07-12 DIAGNOSIS — E875 Hyperkalemia: Secondary | ICD-10-CM

## 2021-07-12 LAB — COMPREHENSIVE METABOLIC PANEL
ALT: 14 U/L (ref 0–35)
AST: 20 U/L (ref 0–37)
Albumin: 4.5 g/dL (ref 3.5–5.2)
Alkaline Phosphatase: 73 U/L (ref 39–117)
BUN: 31 mg/dL — ABNORMAL HIGH (ref 6–23)
CO2: 27 mEq/L (ref 19–32)
Calcium: 11 mg/dL — ABNORMAL HIGH (ref 8.4–10.5)
Chloride: 102 mEq/L (ref 96–112)
Creatinine, Ser: 0.84 mg/dL (ref 0.40–1.20)
GFR: 65.67 mL/min (ref >60.00)
Glucose, Bld: 91 mg/dL (ref 70–99)
Potassium: 5.4 mEq/L — ABNORMAL HIGH (ref 3.5–5.1)
Sodium: 138 mEq/L (ref 135–145)
Total Bilirubin: 0.6 mg/dL (ref 0.2–1.2)
Total Protein: 7 g/dL (ref 6.0–8.3)

## 2021-07-12 LAB — CBC WITH DIFFERENTIAL/PLATELET
Basophils Absolute: 0.1 10*3/uL (ref 0.0–0.1)
Basophils Relative: 0.6 % (ref 0.0–3.0)
Eosinophils Absolute: 0 10*3/uL (ref 0.0–0.7)
Eosinophils Relative: 0.2 % (ref 0.0–5.0)
HCT: 41.1 % (ref 36.0–46.0)
Hemoglobin: 13.5 g/dL (ref 12.0–15.0)
Lymphocytes Relative: 14.7 % (ref 12.0–46.0)
Lymphs Abs: 1.5 10*3/uL (ref 0.7–4.0)
MCHC: 32.9 g/dL (ref 30.0–36.0)
MCV: 93.2 fl (ref 78.0–100.0)
Monocytes Absolute: 0.9 10*3/uL (ref 0.1–1.0)
Monocytes Relative: 8.7 % (ref 3.0–12.0)
Neutro Abs: 7.8 10*3/uL — ABNORMAL HIGH (ref 1.4–7.7)
Neutrophils Relative %: 75.8 % (ref 43.0–77.0)
Platelets: 220 10*3/uL (ref 150.0–400.0)
RBC: 4.41 Mil/uL (ref 3.87–5.11)
RDW: 16.6 % — ABNORMAL HIGH (ref 11.5–15.5)
WBC: 10.3 10*3/uL (ref 4.0–10.5)

## 2021-07-12 LAB — LIPID PANEL
Cholesterol: 198 mg/dL (ref 0–200)
HDL: 113 mg/dL (ref >39.00)
LDL Cholesterol: 74 mg/dL (ref 0–99)
NonHDL: 85.45
Total CHOL/HDL Ratio: 2
Triglycerides: 55 mg/dL (ref 0.0–149.0)
VLDL: 11 mg/dL (ref 0.0–40.0)

## 2021-07-12 NOTE — Therapy (Signed)
[place] ?[facility] ?[place] ?[place], [place], [address] ?Phone: [phone]   Fax:  [phone] ? ?Physical Therapy Treatment ? ?Patient Details  ?Name: Mary Fitzpatrick ?MRN: [id number] ?Date of Birth: [birth year] ?Referring Provider (PT): Justice Britain ? ? ?Encounter Date: 07/12/2021 ? ? PT End of Session - 07/12/21 1010   ? ? Visit Number 26   ? Date for PT Re-Evaluation 08/20/21   ? Authorization Type MCR   ? Progress Note Due on Visit 35   ? PT Start Time 1015   ? PT Stop Time 1100   ? PT Time Calculation (min) 45 min   ? Activity Tolerance Patient tolerated treatment well   ? Behavior During Therapy [facility] for tasks assessed/performed   ? ?  ?  ? ?  ? ? ?Past Medical History:  ?Diagnosis Date  ? Arthritis   ? Hyperlipidemia   ? Hypertension   ? Parathyroid abnormality ([place])   ? nodule on one of the glands  ? ? ?Past Surgical History:  ?Procedure Laterality Date  ? BREAST EXCISIONAL BIOPSY Left   ? BREAST EXCISIONAL BIOPSY Right   ? GASTRECTOMY    ? Due to a ruptured bleeding ulcer  ? LAPAROTOMY    ? exploratory kink in bowel due to water sking  ? ORIF HUMERUS FRACTURE Left 01/15/2021  ? Procedure: OPEN REDUCTION INTERNAL FIXATION (ORIF) PROXIMAL HUMERUS FRACTURE;  Surgeon: Justice Britain, MD;  Location: [facility];  Service: Orthopedics;  Laterality: Left;  ? TONSILLECTOMY    ? ? ?There were no vitals filed for this visit. ? ? Subjective Assessment - 07/12/21 1015   ? ? Subjective Just stiff, not too bad.   ? Pertinent History HTN   ? Patient Stated Goals restore her ROM and strength   ? Currently in Pain? No/denies   ? ?  ?  ? ?  ? ? ? ? ? ? ? ? ? ? ? ? ? ? ? ? ? ? ? ? [place] Adult PT Treatment/Exercise - 07/12/21 0001   ? ?  ? Shoulder Exercises: Prone  ? Retraction Strengthening;Both;20 reps   ?  ? Shoulder Exercises: ROM/Strengthening  ? UBE (Upper Arm Bike) L3.0 2 min fwd/ 2 min back   ? Pushups 20 reps   ? Pushups Limitations cobra push-ups 2 x 10   ?  Modified Plank 5 reps   ? Modified Plank Limitations 10 sec hold, elbows and knees   ? Other ROM/Strengthening Exercises quadruped leg extensions 2 x 5 - arms straight   ? Other ROM/Strengthening Exercises thoracic mobs on foam roller  and chest stretch on foam roller   ? ?  ?  ? ?  ? ? ? ? ? ? ? ? ? ? PT Education - 07/12/21 1212   ? ? Education Details HEP update, recommendations where to buy foam roller   ? Person(s) Educated Patient   ? Methods Explanation;Demonstration;Verbal cues;Handout   ? Comprehension Verbalized understanding;Returned demonstration   ? ?  ?  ? ?  ? ? ? PT Short Term Goals - 05/03/21 1212   ? ?  ? PT SHORT TERM GOAL #1  ? Title Ind with intial HEP   ? Time 2   ? Period Weeks   ? Status Achieved   2/17  ? Target Date 04/16/21   ?  ? PT SHORT TERM GOAL #2  ? Title Decreased pain in the left hand by >=50%  at EOD and in the morning.   ? Time 4   ? Period Weeks   ? Status Achieved   2/17  ? ?  ?  ? ?  ? ? ? ? PT Long Term Goals - 07/09/21 1111   ? ?  ? PT LONG TERM GOAL #1  ? Title Improved left shoulder functional ROM to allow pt to perform ADLS including dressing, grooming and OH activities with minimal difficulty.   ? Time 6   ? Period Weeks   ? Status Partially Met   still difficulty with reaching behind back to dry off with towel and reaching OH with weight  ? Target Date 08/20/21   ?  ? PT LONG TERM GOAL #2  ? Title Improved left UE strength to 4+/5 to allow patient to peform ADLs including carrying, lifting and gardening.   ? Time 6   ? Period Weeks   ? Status On-going   07/09/21- see flow sheet.  ? Target Date 08/20/21   ?  ? PT LONG TERM GOAL #3  ? Title Pt to demonstrate full left elbow extension and be able to WB on elbow without pain.   ? Period Weeks   ? Status Achieved   ? Target Date 05/28/21   ?  ? PT LONG TERM GOAL #4  ? Title Improved left grip strength to at least 10# to improve function of left hand.   ? Time 8   ? Period Weeks   ? Status Achieved   ? Target Date 05/28/21   ?   ? PT LONG TERM GOAL #5  ? Title Ind with advanced HEP to maintain goals made in PT   ? Time 6   ? Period Weeks   ? Status Partially Met   05/28/21- met fo current, getting pulleys tomorrow.  ? Target Date 08/20/21   ? ?  ?  ? ?  ? ? ? ? ? ? ? ? Plan - 07/12/21 1207   ? ? Clinical Impression Statement Today focused on body weight support exercises and thoracic mobilization to improve shoulder mobility.  She tolerated well and reported feeling better at end of session, but did fatigue very quickly with exercises.  She would benefit from continued skilled therapy.   ? Personal Factors and Comorbidities Time since onset of injury/illness/exacerbation;Age   ? PT Frequency 2x / week   ? PT Duration 6 weeks   ? PT Treatment/Interventions ADLs/Self Care Home Management;Aquatic Therapy;Cryotherapy;Electrical Stimulation;Moist Heat;Therapeutic activities;Therapeutic exercise;Neuromuscular re-education;Manual techniques;Patient/family education;Passive range of motion;Dry needling;Taping;Vasopneumatic Device;Joint Manipulations;Spinal Manipulations   ? PT Next Visit Plan continue to focus on shoulder strength and ROM for functional goals.   ? PT Home Exercise Plan [email or web address]   ? Consulted and Agree with Plan of Care Patient   ? ?  ?  ? ?  ? ? ?Patient will benefit from skilled therapeutic intervention in order to improve the following deficits and impairments:  Decreased range of motion, Impaired UE functional use, Increased muscle spasms, Pain, Decreased activity tolerance, Postural dysfunction, Increased edema, Decreased strength, Impaired flexibility ? ?Visit Diagnosis: ?Stiffness of left shoulder, not elsewhere classified ? ?Acute pain of left shoulder ? ?Muscle weakness (generalized) ? ? ? ? ?Problem List ?Patient Active Problem List  ? Diagnosis Date Noted  ? S/P ORIF (open reduction internal fixation) fracture 01/15/2021  ? Hyperkalemia 12/20/2020  ? Primary hyperparathyroidism ([place]) 12/20/2020  ? Need for immunization  against influenza 12/20/2020  ? Hypertension 07/20/2020  ?  Hyperlipidemia 07/20/2020  ? Osteoporosis 07/20/2020  ? Tobacco abuse 07/20/2020  ? ? ?Rennie Natter, PT, DPT  ?07/12/2021, 12:12 PM ? ?[place] ?[facility] ?[place] ?[place], [place], [address] ?Phone: [phone]   Fax:  [phone] ? ?Name: Arin Vanosdol ?MRN: [id number] ?Date of Birth: June 10, 1940 ? ? ? ?

## 2021-07-12 NOTE — Patient Instructions (Signed)
Access Code: [email or web address] ?URL: [email or web address] ?Date: 07/12/2021 ?Prepared by: Glenetta Hew ? ?Exercises Added  ?- Thoracic Mobilization on Foam Roll  - 1 x daily - 7 x weekly - 10 reps ?- Hooklying Arms at 90/90 on Foam Roll  - 1 x daily - 7 x weekly - 10 reps ?- Thoracic Mobilization on Foam Roll - Hands Clasped  - 1 x daily - 7 x weekly - 10 reps ?- Cat Cow  - 1 x daily - 7 x weekly - 10 reps ?- Cobra  - 1 x daily - 7 x weekly - 2 sets - 10 reps ?- Quadruped Alternating Leg Extensions  - 1 x daily - 7 x weekly - 3 sets - 10 reps ?

## 2021-07-18 ENCOUNTER — Other Ambulatory Visit (INDEPENDENT_AMBULATORY_CARE_PROVIDER_SITE_OTHER): Payer: Medicare Other

## 2021-07-18 ENCOUNTER — Ambulatory Visit: Payer: Medicare Other | Attending: Orthopedic Surgery

## 2021-07-18 DIAGNOSIS — M25622 Stiffness of left elbow, not elsewhere classified: Secondary | ICD-10-CM | POA: Insufficient documentation

## 2021-07-18 DIAGNOSIS — M25612 Stiffness of left shoulder, not elsewhere classified: Secondary | ICD-10-CM | POA: Insufficient documentation

## 2021-07-18 DIAGNOSIS — E875 Hyperkalemia: Secondary | ICD-10-CM | POA: Diagnosis not present

## 2021-07-18 DIAGNOSIS — M25512 Pain in left shoulder: Secondary | ICD-10-CM | POA: Diagnosis present

## 2021-07-18 DIAGNOSIS — M6281 Muscle weakness (generalized): Secondary | ICD-10-CM | POA: Insufficient documentation

## 2021-07-18 LAB — BASIC METABOLIC PANEL
BUN: 25 mg/dL — ABNORMAL HIGH (ref 6–23)
CO2: 27 mEq/L (ref 19–32)
Calcium: 10.7 mg/dL — ABNORMAL HIGH (ref 8.4–10.5)
Chloride: 103 mEq/L (ref 96–112)
Creatinine, Ser: 0.92 mg/dL (ref 0.40–1.20)
GFR: 58.88 mL/min — ABNORMAL LOW (ref >60.00)
Glucose, Bld: 93 mg/dL (ref 70–99)
Potassium: 5.1 mEq/L (ref 3.5–5.1)
Sodium: 139 mEq/L (ref 135–145)

## 2021-07-18 NOTE — Therapy (Signed)
[place] ?[facility] ?[place] ?[place], [place], [address] ?Phone: [phone]   Fax:  [phone] ? ?Physical Therapy Treatment ? ?Patient Details  ?Name: Mary Fitzpatrick ?MRN: [id number] ?Date of Birth: [birth year] ?Referring Provider (PT): Justice Britain ? ? ?Encounter Date: 07/18/2021 ? ? PT End of Session - 07/18/21 1016   ? ? Visit Number 27   ? Date for PT Re-Evaluation 08/20/21   ? Authorization Type MCR   ? Progress Note Due on Visit 35   ? PT Start Time [phone]   ? PT Stop Time 1014   ? PT Time Calculation (min) 40 min   ? Activity Tolerance Patient tolerated treatment well   ? Behavior During Therapy [facility] for tasks assessed/performed   ? ?  ?  ? ?  ? ? ?Past Medical History:  ?Diagnosis Date  ? Arthritis   ? Hyperlipidemia   ? Hypertension   ? Parathyroid abnormality ([place])   ? nodule on one of the glands  ? ? ?Past Surgical History:  ?Procedure Laterality Date  ? BREAST EXCISIONAL BIOPSY Left   ? BREAST EXCISIONAL BIOPSY Right   ? GASTRECTOMY    ? Due to a ruptured bleeding ulcer  ? LAPAROTOMY    ? exploratory kink in bowel due to water sking  ? ORIF HUMERUS FRACTURE Left 01/15/2021  ? Procedure: OPEN REDUCTION INTERNAL FIXATION (ORIF) PROXIMAL HUMERUS FRACTURE;  Surgeon: Justice Britain, MD;  Location: [facility];  Service: Orthopedics;  Laterality: Left;  ? TONSILLECTOMY    ? ? ?There were no vitals filed for this visit. ? ? Subjective Assessment - 07/18/21 0937   ? ? Subjective Feeling good today with no pain.   ? Pertinent History HTN   ? Patient Stated Goals restore her ROM and strength   ? Currently in Pain? No/denies   ? ?  ?  ? ?  ? ? ? ? ? ? ? ? ? ? ? ? ? ? ? ? ? ? ? ? [facility] Adult PT Treatment/Exercise - 07/18/21 0001   ? ?  ? Shoulder Exercises: Supine  ? Flexion AROM;Strengthening;Both;10 reps   ? Flexion Limitations cane with 4# weight around it   ? Other Supine Exercises chest pressed with cane and 4# around it x 10; L arm CW/CCW circles with 3#  weight 2x10 eahch direction   ?  ? Shoulder Exercises: Prone  ? Other Prone Exercises planks on elbows and knees 3x10"   ? Other Prone Exercises tried kneeling push ups but pt had difficulty with correct movement   ?  ? Shoulder Exercises: Sidelying  ? External Rotation Left;15 reps   ? External Rotation Weight (lbs) 3   ?  ? Shoulder Exercises: ROM/Strengthening  ? UBE (Upper Arm Bike) L3.0 3 min fwd/ 3 min back   ?  ? Shoulder Exercises: Stretch  ? External Rotation Stretch --   with towel behind head  ? Other Shoulder Stretches L shoulder IR with towel behind back 10x3"   ?  ? Manual Therapy  ? Manual Therapy Passive ROM   ? Passive ROM L shoulder all motions to tolerance   ? ?  ?  ? ?  ? ? ? ? ? ? ? ? ? ? ? ? PT Short Term Goals - 05/03/21 1212   ? ?  ? PT SHORT TERM GOAL #1  ? Title Ind with intial HEP   ? Time 2   ?  Period Weeks   ? Status Achieved   2/17  ? Target Date 04/16/21   ?  ? PT SHORT TERM GOAL #2  ? Title Decreased pain in the left hand by >=50% at EOD and in the morning.   ? Time 4   ? Period Weeks   ? Status Achieved   2/17  ? ?  ?  ? ?  ? ? ? ? PT Long Term Goals - 07/09/21 1111   ? ?  ? PT LONG TERM GOAL #1  ? Title Improved left shoulder functional ROM to allow pt to perform ADLS including dressing, grooming and OH activities with minimal difficulty.   ? Time 6   ? Period Weeks   ? Status Partially Met   still difficulty with reaching behind back to dry off with towel and reaching OH with weight  ? Target Date 08/20/21   ?  ? PT LONG TERM GOAL #2  ? Title Improved left UE strength to 4+/5 to allow patient to peform ADLs including carrying, lifting and gardening.   ? Time 6   ? Period Weeks   ? Status On-going   07/09/21- see flow sheet.  ? Target Date 08/20/21   ?  ? PT LONG TERM GOAL #3  ? Title Pt to demonstrate full left elbow extension and be able to WB on elbow without pain.   ? Period Weeks   ? Status Achieved   ? Target Date 05/28/21   ?  ? PT LONG TERM GOAL #4  ? Title Improved left grip  strength to at least 10# to improve function of left hand.   ? Time 8   ? Period Weeks   ? Status Achieved   ? Target Date 05/28/21   ?  ? PT LONG TERM GOAL #5  ? Title Ind with advanced HEP to maintain goals made in PT   ? Time 6   ? Period Weeks   ? Status Partially Met   05/28/21- met fo current, getting pulleys tomorrow.  ? Target Date 08/20/21   ? ?  ?  ? ?  ? ? ? ? ? ? ? ? Plan - 07/18/21 1017   ? ? Clinical Impression Statement Pt still is showing difficulty with reaching OH against gravity. All interventions done were to strengthen her shoulder and restore functional ROM to improve tolerance for ADLs. Cues were provided throughout session as needed to ensure proper performance of exercises. Pt finished session w/o any increased pain.   ? Personal Factors and Comorbidities Time since onset of injury/illness/exacerbation;Age   ? PT Frequency 2x / week   ? PT Duration 6 weeks   ? PT Treatment/Interventions ADLs/Self Care Home Management;Aquatic Therapy;Cryotherapy;Electrical Stimulation;Moist Heat;Therapeutic activities;Therapeutic exercise;Neuromuscular re-education;Manual techniques;Patient/family education;Passive range of motion;Dry needling;Taping;Vasopneumatic Device;Joint Manipulations;Spinal Manipulations   ? PT Next Visit Plan continue to focus on shoulder strength and ROM for functional goals.   ? PT Home Exercise Plan [email or web address]   ? Consulted and Agree with Plan of Care Patient   ? ?  ?  ? ?  ? ? ?Patient will benefit from skilled therapeutic intervention in order to improve the following deficits and impairments:  Decreased range of motion, Impaired UE functional use, Increased muscle spasms, Pain, Decreased activity tolerance, Postural dysfunction, Increased edema, Decreased strength, Impaired flexibility ? ?Visit Diagnosis: ?Stiffness of left shoulder, not elsewhere classified ? ?Acute pain of left shoulder ? ?Muscle weakness (generalized) ? ?Stiffness of left elbow, not elsewhere  classified ? ? ? ? ?Problem List ?Patient Active Problem List  ? Diagnosis Date Noted  ? S/P ORIF (open reduction internal fixation) fracture 01/15/2021  ? Hyperkalemia 12/20/2020  ? 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 Need for immunization against influenza 12/20/2020  ? Hypertension 07/20/2020  ? Hyperlipidemia 07/20/2020  ? Osteoporosis 07/20/2020  ? Tobacco abuse 07/20/2020  ? ? ?Artist Pais, PTA ?07/18/2021, 11:40 AM ? ?[place] ?[facility] ?[place] ?[place], [place], [address] ?Phone: [phone]   Fax:  [phone] ? ?Name: Mary Fitzpatrick ?MRN: [id number] ?Date of Birth: 1940/05/20 ? ? ? ?

## 2021-07-22 ENCOUNTER — Ambulatory Visit (INDEPENDENT_AMBULATORY_CARE_PROVIDER_SITE_OTHER): Payer: Medicare Other

## 2021-07-22 VITALS — Ht 67.0 in | Wt 117.0 lb

## 2021-07-22 DIAGNOSIS — Z0000 Encounter for general adult medical examination without abnormal findings: Secondary | ICD-10-CM | POA: Diagnosis not present

## 2021-07-22 NOTE — Patient Instructions (Signed)
Mary Fitzpatrick , ?Thank you for taking time to complete your Medicare Wellness Visit. I appreciate your ongoing commitment to your health goals. Please review the following plan we discussed and let me know if I can assist you in the future.  ? ?Screening recommendations/referrals: ?Colonoscopy: No longer required ?Mammogram: Completed 08/06/2020-Due 08/06/2021 ?Bone Density: Completed 08/06/2020-Due 08/07/2022 ?Recommended yearly ophthalmology/optometry visit for glaucoma screening and checkup ?Recommended yearly dental visit for hygiene and checkup ? ?Vaccinations: ?Influenza vaccine: Up to date ?Pneumococcal vaccine: Due-May obtain vaccine at our office or your local pharmacy. ?Tdap vaccine:  Due-May obtain vaccine at your local pharmacy. ?Shingles vaccine:  Due-May obtain vaccine at your local pharmacy.   ?Covid-19:Booster available at the pharmacy. ? ?Advanced directives: May pick up packet at our office ? ?Conditions/risks identified: See problem list ? ?Next appointment: Follow up in one year for your annual wellness visit  ? ? ?Preventive Care 81 Years and Older, Female ?Preventive care refers to lifestyle choices and visits with your health care provider that can promote health and wellness. ?What does preventive care include? ?A yearly physical exam. This is also called an annual well check. ?Dental exams once or twice a year. ?Routine eye exams. Ask your health care provider how often you should have your eyes checked. ?Personal lifestyle choices, including: ?Daily care of your teeth and gums. ?Regular physical activity. ?Eating a healthy diet. ?Avoiding tobacco and drug use. ?Limiting alcohol use. ?Practicing safe sex. ?Taking low-dose aspirin every day. ?Taking vitamin and mineral supplements as recommended by your health care provider. ?What happens during an annual well check? ?The services and screenings done by your health care provider during your annual well check will depend on your age, overall health,  lifestyle risk factors, and family history of disease. ?Counseling  ?Your health care provider may ask you questions about your: ?Alcohol use. ?Tobacco use. ?Drug use. ?Emotional well-being. ?Home and relationship well-being. ?Sexual activity. ?Eating habits. ?History of falls. ?Memory and ability to understand (cognition). ?Work and work Statistician. ?Reproductive health. ?Screening  ?You may have the following tests or measurements: ?Height, weight, and BMI. ?Blood pressure. ?Lipid and cholesterol levels. These may be checked every 5 years, or more frequently if you are over 70 years old. ?Skin check. ?Lung cancer screening. You may have this screening every year starting at age 5 if you have a 30-pack-year history of smoking and currently smoke or have quit within the past 15 years. ?Fecal occult blood test (FOBT) of the stool. You may have this test every year starting at age 81. ?Flexible sigmoidoscopy or colonoscopy. You may have a sigmoidoscopy every 5 years or a colonoscopy every 10 years starting at age 75. ?Hepatitis C blood test. ?Hepatitis B blood test. ?Sexually transmitted disease (STD) testing. ?Diabetes screening. This is done by checking your blood sugar (glucose) after you have not eaten for a while (fasting). You may have this done every 1-3 years. ?Bone density scan. This is done to screen for osteoporosis. You may have this done starting at age 81. ?Mammogram. This may be done every 1-2 years. Talk to your health care provider about how often you should have regular mammograms. ?Talk with your health care provider about your test results, treatment options, and if necessary, the need for more tests. ?Vaccines  ?Your health care provider may recommend certain vaccines, such as: ?Influenza vaccine. This is recommended every year. ?Tetanus, diphtheria, and acellular pertussis (Tdap, Td) vaccine. You may need a Td booster every 10 years. ?Zoster vaccine.  You may need this after age  81. ?Pneumococcal 13-valent conjugate (PCV13) vaccine. One dose is recommended after age 32. ?Pneumococcal polysaccharide (PPSV23) vaccine. One dose is recommended after age 27. ?Talk to your health care provider about which screenings and vaccines you need and how often you need them. ?This information is not intended to replace advice given to you by your health care provider. Make sure you discuss any questions you have with your health care provider. ?Document Released: 03/30/2015 Document Revised: 11/21/2015 Document Reviewed: 01/02/2015 ?Elsevier Interactive Patient Education ? 2017 [place]. ? ?Fall Prevention in the Home ?Falls can cause injuries. They can happen to people of all ages. There are many things you can do to make your home safe and to help prevent falls. ?What can I do on the outside of my home? ?Regularly fix the edges of walkways and driveways and fix any cracks. ?Remove anything that might make you trip as you walk through a door, such as a raised step or threshold. ?Trim any bushes or trees on the path to your home. ?Use bright outdoor lighting. ?Clear any walking paths of anything that might make someone trip, such as rocks or tools. ?Regularly check to see if handrails are loose or broken. Make sure that both sides of any steps have handrails. ?Any raised decks and porches should have guardrails on the edges. ?Have any leaves, snow, or ice cleared regularly. ?Use sand or salt on walking paths during winter. ?Clean up any spills in your garage right away. This includes oil or grease spills. ?What can I do in the bathroom? ?Use night lights. ?Install grab bars by the toilet and in the tub and shower. Do not use towel bars as grab bars. ?Use non-skid mats or decals in the tub or shower. ?If you need to sit down in the shower, use a plastic, non-slip stool. ?Keep the floor dry. Clean up any water that spills on the floor as soon as it happens. ?Remove soap buildup in the tub or shower  regularly. ?Attach bath mats securely with double-sided non-slip rug tape. ?Do not have throw rugs and other things on the floor that can make you trip. ?What can I do in the bedroom? ?Use night lights. ?Make sure that you have a light by your bed that is easy to reach. ?Do not use any sheets or blankets that are too big for your bed. They should not hang down onto the floor. ?Have a firm chair that has side arms. You can use this for support while you get dressed. ?Do not have throw rugs and other things on the floor that can make you trip. ?What can I do in the kitchen? ?Clean up any spills right away. ?Avoid walking on wet floors. ?Keep items that you use a lot in easy-to-reach places. ?If you need to reach something above you, use a strong step stool that has a grab bar. ?Keep electrical cords out of the way. ?Do not use floor polish or wax that makes floors slippery. If you must use wax, use non-skid floor wax. ?Do not have throw rugs and other things on the floor that can make you trip. ?What can I do with my stairs? ?Do not leave any items on the stairs. ?Make sure that there are handrails on both sides of the stairs and use them. Fix handrails that are broken or loose. Make sure that handrails are as long as the stairways. ?Check any carpeting to make sure that it is  firmly attached to the stairs. Fix any carpet that is loose or worn. ?Avoid having throw rugs at the top or bottom of the stairs. If you do have throw rugs, attach them to the floor with carpet tape. ?Make sure that you have a light switch at the top of the stairs and the bottom of the stairs. If you do not have them, ask someone to add them for you. ?What else can I do to help prevent falls? ?Wear shoes that: ?Do not have high heels. ?Have rubber bottoms. ?Are comfortable and fit you well. ?Are closed at the toe. Do not wear sandals. ?If you use a stepladder: ?Make sure that it is fully opened. Do not climb a closed stepladder. ?Make sure that  both sides of the stepladder are locked into place. ?Ask someone to hold it for you, if possible. ?Clearly mark and make sure that you can see: ?Any grab bars or handrails. ?First and last steps. ?Where the edge of each

## 2021-07-22 NOTE — Progress Notes (Signed)
? ?Subjective:  ? Mary Fitzpatrick is a 81 y.o. female who presents for an Initial Medicare Annual Wellness Visit. ? ?I connected with Kamorah today by telephone and verified that I am speaking with the correct person using two identifiers. ?Location patient: home ?Location provider: work ?Persons participating in the virtual visit: patient, nurse.  ?  ?I discussed the limitations, risks, security and privacy concerns of performing an evaluation and management service by telephone and the availability of in person appointments. I also discussed with the patient that there may be a patient responsible charge related to this service. The patient expressed understanding and verbally consented to this telephonic visit.  ?  ?Interactive audio and video telecommunications were attempted between this provider and patient, however failed, due to patient having technical difficulties OR patient did not have access to video capability.  We continued and completed visit with audio only. ? ?Some vital signs may be absent or patient reported.  ? ?Time Spent with patient on telephone encounter: 25 minutes ? ? ?Review of Systems    ? ?Cardiac Risk Factors include: advanced age (>50men, >64 women);hypertension;dyslipidemia;smoking/ tobacco exposure ? ?   ?Objective:  ?  ?Today's Vitals  ? 07/22/21 1231  ?Weight: 117 lb (53.1 kg)  ?Height: 5\' 7"  (1.702 m)  ? ?Body mass index is 18.32 kg/m?. ? ? ?  07/22/2021  ? 12:34 PM 04/02/2021  ? 10:25 AM 01/15/2021  ?  1:29 PM 01/14/2021  ?  1:37 PM  ?Advanced Directives  ?Does Patient Have a Medical Advance Directive? No No No No  ?Would patient like information on creating a medical advance directive? Yes (MAU/Ambulatory/Procedural Areas - Information given) No - Patient declined No - Patient declined No - Patient declined  ? ? ?Current Medications (verified) ?Outpatient Encounter Medications as of 07/22/2021  ?Medication Sig  ? amLODipine (NORVASC) 2.5 MG tablet Take 1 tablet (2.5 mg total) by  mouth daily.  ? atorvastatin (LIPITOR) 20 MG tablet Take 1 tablet (20 mg total) by mouth every morning.  ? cholecalciferol (VITAMIN D3) 25 MCG (1000 UNIT) tablet Take 1,000 Units by mouth daily.  ? ?No facility-administered encounter medications on file as of 07/22/2021.  ? ? ?Allergies (verified) ?Hctz [hydrochlorothiazide]  ? ?History: ?Past Medical History:  ?Diagnosis Date  ? Arthritis   ? Hyperlipidemia   ? Hypertension   ? Parathyroid abnormality (HCC)   ? nodule on one of the glands  ? ?Past Surgical History:  ?Procedure Laterality Date  ? BREAST EXCISIONAL BIOPSY Left   ? BREAST EXCISIONAL BIOPSY Right   ? GASTRECTOMY    ? Due to a ruptured bleeding ulcer  ? LAPAROTOMY    ? exploratory kink in bowel due to water sking  ? ORIF HUMERUS FRACTURE Left 01/15/2021  ? Procedure: OPEN REDUCTION INTERNAL FIXATION (ORIF) PROXIMAL HUMERUS FRACTURE;  Surgeon: 13/03/2020, MD;  Location: [facility];  Service: Orthopedics;  Laterality: Left;  ? TONSILLECTOMY    ? ?History reviewed. No pertinent family history. ?Social History  ? ?Socioeconomic History  ? Marital status: Widowed  ?  Spouse name: Not on file  ? Number of children: Not on file  ? Years of education: Not on file  ? Highest education level: Not on file  ?Occupational History  ? Not on file  ?Tobacco Use  ? Smoking status: Some Days  ?  Years: 40.00  ?  Types: Cigarettes  ? Smokeless tobacco: Never  ?Vaping Use  ? Vaping Use: Never used  ?Substance and  Sexual Activity  ? Alcohol use: Yes  ?  Alcohol/week: 2.0 standard drinks  ?  Types: 2 Glasses of wine per week  ?  Comment: most nights  ? Drug use: Not Currently  ? Sexual activity: Not Currently  ?Other Topics Concern  ? Not on file  ?Social History Narrative  ? Not on file  ? ?Social Determinants of Health  ? ?Financial Resource Strain: Low Risk   ? Difficulty of Paying Living Expenses: Not hard at all  ?Food Insecurity: No Food Insecurity  ? Worried About Programme researcher, broadcasting/film/video in the Last Year: Never true  ? Ran  Out of Food in the Last Year: Never true  ?Transportation Needs: No Transportation Needs  ? Lack of Transportation (Medical): No  ? Lack of Transportation (Non-Medical): No  ?Physical Activity: Sufficiently Active  ? Days of Exercise per Week: 7 days  ? Minutes of Exercise per Session: 40 min  ?Stress: No Stress Concern Present  ? Feeling of Stress : Not at all  ?Social Connections: Moderately Isolated  ? Frequency of Communication with Friends and Family: More than three times a week  ? Frequency of Social Gatherings with Friends and Family: More than three times a week  ? Attends Religious Services: More than 4 times per year  ? Active Member of Clubs or Organizations: No  ? Attends Banker Meetings: Never  ? Marital Status: Widowed  ? ? ?Tobacco Counseling ?Ready to quit: Not Answered ?Counseling given: Not Answered ? ? ?Clinical Intake: ? ?Pre-visit preparation completed: Yes ? ?Pain : No/denies pain ? ?  ? ?BMI - recorded: 18.32 ?Nutritional Status: BMI <19  Underweight ?Nutritional Risks: None ?Diabetes: No ? ?How often do you need to have someone help you when you read instructions, pamphlets, or other written materials from your doctor or pharmacy?: 1 - Never ? ?Diabetic?No ? ?Interpreter Needed?: No ? ?Information entered by :: Thomasenia Sales LPN ? ? ?Activities of Daily Living ? ?  07/22/2021  ? 12:42 PM 01/15/2021  ?  9:02 PM  ?In your present state of health, do you have any difficulty performing the following activities:  ?Hearing? 0 0  ?Vision? 0 0  ?Difficulty concentrating or making decisions? 0 0  ?Walking or climbing stairs? 0 0  ?Dressing or bathing? 0 0  ?Doing errands, shopping? 0 0  ?Preparing Food and eating ? N   ?Using the Toilet? N   ?In the past six months, have you accidently leaked urine? N   ?Do you have problems with loss of bowel control? N   ?Managing your Medications? N   ?Managing your Finances? N   ?Housekeeping or managing your Housekeeping? N   ? ? ?Patient Care  Team: ?Olive Bass, FNP as PCP - General (Internal Medicine) ? ?Indicate any recent Medical Services you may have received from other than [facility] providers in the past year (date may be approximate). ? ?   ?Assessment:  ? This is a routine wellness examination for [place]. ? ?Hearing/Vision screen ?Hearing Screening - Comments:: No issues ?Vision Screening - Comments:: Last eye exam-09/2020 ? ?Dietary issues and exercise activities discussed: ?Current Exercise Habits: Home exercise routine, Type of exercise: walking, Time (Minutes): 45, Frequency (Times/Week): 7, Weekly Exercise (Minutes/Week): 315, Intensity: Mild, Exercise limited by: None identified ? ? Goals Addressed   ? ?  ?  ?  ?  ? This Visit's Progress  ?  Patient Stated     ?  Maintain  current health & activity level & drink more water ?  ? ?  ? ?Depression Screen ? ?  07/22/2021  ? 12:42 PM 07/11/2021  ?  1:00 PM 10/26/2020  ?  9:21 AM 07/20/2020  ?  9:55 AM  ?PHQ 2/9 Scores  ?PHQ - 2 Score 0 0 0 0  ?  ?Fall Risk ? ?  07/22/2021  ? 12:39 PM 07/11/2021  ?  1:00 PM 10/26/2020  ?  9:20 AM 07/20/2020  ?  9:55 AM  ?Fall Risk   ?Falls in the past year? 1 1 0 0  ?Number falls in past yr: 0 0 0 0  ?Injury with Fall? 1 1 0 0  ?Risk for fall due to : History of fall(s) Impaired balance/gait;History of fall(s) No Fall Risks No Fall Risks  ?Follow up Falls prevention discussed Falls evaluation completed;Falls prevention discussed Falls evaluation completed Falls evaluation completed  ? ? ?FALL RISK PREVENTION PERTAINING TO THE HOME: ? ?Any stairs in or around the home? No  ?Home free of loose throw rugs in walkways, pet beds, electrical cords, etc? Yes  ?Adequate lighting in your home to reduce risk of falls? Yes  ? ?ASSISTIVE DEVICES UTILIZED TO PREVENT FALLS: ? ?Life alert? No  ?Use of a cane, walker or w/c? No  ?Grab bars in the bathroom? No  ?Shower chair or bench in shower? Yes  ?Elevated toilet seat or a handicapped toilet? No  ? ?TIMED UP AND GO: ? ?Was the test  performed? No . Phone viist ? ? ?Cognitive Function:Normal cognitive status assessed by this Nurse Health Advisor. No abnormalities found.  ? ?  ?  ?  ? ?Immunizations ?Immunization History  ?Administered Date(s)

## 2021-07-23 ENCOUNTER — Ambulatory Visit: Payer: Medicare Other | Admitting: Physical Therapy

## 2021-07-23 ENCOUNTER — Encounter: Payer: Self-pay | Admitting: Physical Therapy

## 2021-07-23 DIAGNOSIS — M25612 Stiffness of left shoulder, not elsewhere classified: Secondary | ICD-10-CM

## 2021-07-23 DIAGNOSIS — M25512 Pain in left shoulder: Secondary | ICD-10-CM

## 2021-07-23 DIAGNOSIS — M6281 Muscle weakness (generalized): Secondary | ICD-10-CM

## 2021-07-23 NOTE — Therapy (Signed)
?OUTPATIENT PHYSICAL THERAPY TREATMENT NOTE ? ? ?Patient Name: Mary Fitzpatrick ?MRN: [id number] ?DOB:01/05/1941, 81 y.o., female ?Today's Date: 07/23/2021 ? ?PCP: Marrian Salvage, [place] ?REFERRING PROVIDER: Justice Britain, MD ? ? PT End of Session - 07/23/21 1318   ? ? Visit Number 28   ? Date for PT Re-Evaluation 08/20/21   ? Authorization Type MCR   ? Progress Note Due on Visit 35   ? PT Start Time 1316   ? PT Stop Time 1400   ? PT Time Calculation (min) 44 min   ? Activity Tolerance Patient tolerated treatment well   ? Behavior During Therapy [facility] for tasks assessed/performed   ? ?  ?  ? ?  ? ? ?Past Medical History:  ?Diagnosis Date  ? Arthritis   ? Hyperlipidemia   ? Hypertension   ? Parathyroid abnormality ([place])   ? nodule on one of the glands  ? ?Past Surgical History:  ?Procedure Laterality Date  ? BREAST EXCISIONAL BIOPSY Left   ? BREAST EXCISIONAL BIOPSY Right   ? GASTRECTOMY    ? Due to a ruptured bleeding ulcer  ? LAPAROTOMY    ? exploratory kink in bowel due to water sking  ? ORIF HUMERUS FRACTURE Left 01/15/2021  ? Procedure: OPEN REDUCTION INTERNAL FIXATION (ORIF) PROXIMAL HUMERUS FRACTURE;  Surgeon: Justice Britain, MD;  Location: [facility];  Service: Orthopedics;  Laterality: Left;  ? TONSILLECTOMY    ? ?Patient Active Problem List  ? Diagnosis Date Noted  ? S/P ORIF (open reduction internal fixation) fracture 01/15/2021  ? Hyperkalemia 12/20/2020  ? Primary hyperparathyroidism ([place]) 12/20/2020  ? Need for immunization against influenza 12/20/2020  ? Hypertension 07/20/2020  ? Hyperlipidemia 07/20/2020  ? Osteoporosis 07/20/2020  ? Tobacco abuse 07/20/2020  ? ? ?REFERRING DIAG: s/p ORIF L humeral fx ? ?THERAPY DIAG:  ?Stiffness of left shoulder, not elsewhere classified ? ?Acute pain of left shoulder ? ?Muscle weakness (generalized) ? ?PERTINENT HISTORY: HTN, surgery 01/15/21, post-op frozen shoulder. ? ?PRECAUTIONS: none ? ?SUBJECTIVE: Pt. Reports she sees her surgeon again tomorrow.  Shoulder is doing  well, she just doesn't have any energy.   Was able to reach to 2nd shelf with LUE and retreive item.   ? ?PAIN:  ?Are you having pain? No ? ?OBJECTIVE: (objective measures completed at initial evaluation unless otherwise dated) ? Observation/Other Assessments                                 07/09/21 07/23/21  ?  Focus on Therapeutic Outcomes (FOTO)  shoulder 63%    ?      ?  AROM   ?  Left Shoulder Flexion 100 Degrees  100 AROM (120 PROM)  ?  Left Shoulder ABduction 108 Degrees   scaption 100 AROM (115 PROM)  ?  Left Shoulder Internal Rotation --   FIR to sacrum FER to waistband  ?  Left Shoulder External Rotation --   FER to C7 FIR to T2  ?      ?  Strength   ?  Left Shoulder Flexion 3+/5  3+/5  ?  Left Shoulder ABduction 3+/5  3+5  ?  Left Shoulder Internal Rotation 4+/5  4/5  ?  Left Shoulder External Rotation 4-/5  4+/5  ? ? ? ?TODAY'S TREATMENT:  ?Therapeutic Exercise: to improve strength and mobility.  Verbal and tactile cues throughout for technique. ?UBE level 2 x 8 min ([id number]) ?  IR stretch with strap 5 x 30 sec hold ?Prone press-ups (cobra pose) x 10 ?Quadruped arm extensions x 5 bil ?Quadruped leg extensions x 5 bil ?Thoracic mobs on foam roller x 10 ?Pec stretch lengthwise on foam roller, followed by shoulder flexion x 10 bil , horizontal abduction x 10 bil, shoulder abduction x 10 bil  ?Supine shoulder protractions 2 x 15 LUE with 2# weight, minA to perform motion correction, supine shoulder flexion 3# 2 x 10 LUE ?Sidelying L shoulder ER with 3# 3 x 10.  ? ?PATIENT EDUCATION: ?Education details: review of HEP, demo on IR stretch with strap, tolerated better than towel ?Person educated: Patient ?Education method: Explanation and Demonstration ?Education comprehension: verbalized understanding and returned demonstration ? ? ?HOME EXERCISE PROGRAM: ?[id number] ? ?ASSESSMENT: ?Clinical Impression: ?Mary Fitzpatrick is making slow progress towards goals, reporting improved functional reaching at home.  She still  demonstrates weakness in ant/lat deltoids and tightness R shoulder limiting overhead reaching.  Today modified IR stretch with strap (can use nylon dog leash at home), which she felt was much easier and gave her a better stretch.  Then continued to progress tolerance for body weight exercises, and also on foam roller to improve posture as well.  She tolerated exercises well and reported decreased tightness.  She would benefit from continued skilled therapy, progressing towards more independence with HEP.  ? ? PT Short Term Goals - 05/03/21 1212   ? ?  ? PT SHORT TERM GOAL #1  ? Title Ind with intial HEP   ? Time 2   ? Period Weeks   ? Status Achieved   2/17  ? Target Date 04/16/21   ?  ? PT SHORT TERM GOAL #2  ? Title Decreased pain in the left hand by >=50% at EOD and in the morning.   ? Time 4   ? Period Weeks   ? Status Achieved   2/17  ? ?  ?  ? ?  ? ? ? PT Long Term Goals - 07/09/21 1111   ? ?  ? PT LONG TERM GOAL #1  ? Title Improved left shoulder functional ROM to allow pt to perform ADLS including dressing, grooming and OH activities with minimal difficulty.   ? Time 6   ? Period Weeks   ? Status Partially Met   still difficulty with reaching behind back to dry off with towel and reaching OH with weight  ? Target Date 08/20/21   ?  ? PT LONG TERM GOAL #2  ? Title Improved left UE strength to 4+/5 to allow patient to peform ADLs including carrying, lifting and gardening.   ? Time 6   ? Period Weeks   ? Status On-going   07/09/21- see flow sheet.  ? Target Date 08/20/21   ?  ? PT LONG TERM GOAL #3  ? Title Pt to demonstrate full left elbow extension and be able to WB on elbow without pain.   ? Period Weeks   ? Status Achieved   ? Target Date 05/28/21   ?  ? PT LONG TERM GOAL #4  ? Title Improved left grip strength to at least 10# to improve function of left hand.   ? Time 8   ? Period Weeks   ? Status Achieved   ? Target Date 05/28/21   ?  ? PT LONG TERM GOAL #5  ? Title Ind with advanced HEP to maintain goals  made in PT   ? Time 6   ?  Period Weeks   ? Status Partially Met   05/28/21- met fo current, getting pulleys tomorrow.  ? Target Date 08/20/21   ? ?  ?  ? ?  ? ?PLAN:  ? ?  PT Frequency 2x / week   ?  PT Duration 6 weeks   ?  PT Treatment/Interventions ADLs/Self Care Home Management;Aquatic Therapy;Cryotherapy;Electrical Stimulation;Moist Heat;Therapeutic activities;Therapeutic exercise;Neuromuscular re-education;Manual techniques;Patient/family education;Passive range of motion;Dry needling;Taping;Vasopneumatic Device;Joint Manipulations;Spinal Manipulations   ?  PT Next Visit Plan continue to focus on shoulder strength and ROM for functional goals.   ?  ?   ?  ?  ?   ? Patient will benefit from skilled therapeutic intervention in order to improve the following deficits and impairments:  Decreased range of motion, Impaired UE functional use, Increased muscle spasms, Pain, Decreased activity tolerance, Postural dysfunction, Increased edema, Decreased strength, Impaired flexibility ? ?Rennie Natter, PT, DPT  ?07/23/2021, 4:28 PM ? ?   ?

## 2021-07-26 ENCOUNTER — Ambulatory Visit: Payer: Medicare Other

## 2021-07-26 DIAGNOSIS — M25612 Stiffness of left shoulder, not elsewhere classified: Secondary | ICD-10-CM | POA: Diagnosis not present

## 2021-07-26 DIAGNOSIS — M6281 Muscle weakness (generalized): Secondary | ICD-10-CM

## 2021-07-26 DIAGNOSIS — M25622 Stiffness of left elbow, not elsewhere classified: Secondary | ICD-10-CM

## 2021-07-26 DIAGNOSIS — M25512 Pain in left shoulder: Secondary | ICD-10-CM

## 2021-07-26 NOTE — Therapy (Signed)
[place] [facility] ?[address] ?[place], [place], [address] ?Phone: [phone]   Fax:  [phone] ? ?Physical Therapy Treatment ? ?Patient Details  ?Name: Mary Fitzpatrick ?MRN: [id number] ?Date of Birth: 1940/09/14 ?Referring Provider (PT): Justice Britain ? ? ?Encounter Date: 07/26/2021 ? ? PT End of Session - 07/26/21 1103   ? ? Visit Number 29   ? Date for PT Re-Evaluation 08/20/21   ? Authorization Type MCR   ? Progress Note Due on Visit 35   ? PT Start Time 1016   ? PT Stop Time 1058   ? PT Time Calculation (min) 42 min   ? Activity Tolerance Patient tolerated treatment well   ? Behavior During Therapy [facility] for tasks assessed/performed   ? ?  ?  ? ?  ? ? ?Past Medical History:  ?Diagnosis Date  ? Arthritis   ? Hyperlipidemia   ? Hypertension   ? Parathyroid abnormality ([place])   ? nodule on one of the glands  ? ? ?Past Surgical History:  ?Procedure Laterality Date  ? BREAST EXCISIONAL BIOPSY Left   ? BREAST EXCISIONAL BIOPSY Right   ? GASTRECTOMY    ? Due to a ruptured bleeding ulcer  ? LAPAROTOMY    ? exploratory kink in bowel due to water sking  ? ORIF HUMERUS FRACTURE Left 01/15/2021  ? Procedure: OPEN REDUCTION INTERNAL FIXATION (ORIF) PROXIMAL HUMERUS FRACTURE;  Surgeon: Justice Britain, MD;  Location: [facility];  Service: Orthopedics;  Laterality: Left;  ? TONSILLECTOMY    ? ? ?There were no vitals filed for this visit. ? ? Subjective Assessment - 07/26/21 1026   ? ? Subjective Still moving along.   ? Pertinent History HTN   ? Patient Stated Goals restore her ROM and strength   ? Currently in Pain? No/denies   ? ?  ?  ? ?  ? ? ? ? ? ? ? ? ? ? ? ? ? ? ? ? ? ? ? ? [place] Adult PT Treatment/Exercise - 07/26/21 0001   ? ?  ? Shoulder Exercises: Sidelying  ? ABduction AROM;Strengthening;15 reps   2 sets  ? ABduction Weight (lbs) 1   ?  ? Shoulder Exercises: Standing  ? External Rotation Strengthening;Left;20 reps;Theraband   ? Theraband Level (Shoulder External  Rotation) Level 2 (Red)   ? Flexion Left;20 reps   ? Flexion Limitations no weight   ? Diagonals Strengthening;Left;10 reps;Theraband   2 set  ? Theraband Level (Shoulder Diagonals) Level 2 (Red)   ?  ? Shoulder Exercises: ROM/Strengthening  ? UBE (Upper Arm Bike) L3.0 63mn fwd/2 min back   ? Lat Pull Limitations 15# x 20   ? Cybex Row Limitations 15# x 20   ? Ball on Wall circles with ball on wall 3x30 sec   ? ?  ?  ? ?  ? ? ? ? ? ? ? ? ? ? ? ? PT Short Term Goals - 05/03/21 1212   ? ?  ? PT SHORT TERM GOAL #1  ? Title Ind with intial HEP   ? Time 2   ? Period Weeks   ? Status Achieved   2/17  ? Target Date 04/16/21   ?  ? PT SHORT TERM GOAL #2  ? Title Decreased pain in the left hand by >=50% at EOD and in the morning.   ? Time 4   ? Period Weeks   ? Status Achieved  2/17  ? ?  ?  ? ?  ? ? ? ? PT Long Term Goals - 07/09/21 1111   ? ?  ? PT LONG TERM GOAL #1  ? Title Improved left shoulder functional ROM to allow pt to perform ADLS including dressing, grooming and OH activities with minimal difficulty.   ? Time 6   ? Period Weeks   ? Status Partially Met   still difficulty with reaching behind back to dry off with towel and reaching OH with weight  ? Target Date 08/20/21   ?  ? PT LONG TERM GOAL #2  ? Title Improved left UE strength to 4+/5 to allow patient to peform ADLs including carrying, lifting and gardening.   ? Time 6   ? Period Weeks   ? Status On-going   07/09/21- see flow sheet.  ? Target Date 08/20/21   ?  ? PT LONG TERM GOAL #3  ? Title Pt to demonstrate full left elbow extension and be able to WB on elbow without pain.   ? Period Weeks   ? Status Achieved   ? Target Date 05/28/21   ?  ? PT LONG TERM GOAL #4  ? Title Improved left grip strength to at least 10# to improve function of left hand.   ? Time 8   ? Period Weeks   ? Status Achieved   ? Target Date 05/28/21   ?  ? PT LONG TERM GOAL #5  ? Title Ind with advanced HEP to maintain goals made in PT   ? Time 6   ? Period Weeks   ? Status Partially Met    05/28/21- met fo current, getting pulleys tomorrow.  ? Target Date 08/20/21   ? ?  ?  ? ?  ? ? ? ? ? ? ? ? Plan - 07/26/21 1212   ? ? Clinical Impression Statement Pt today presented more OH reaching with ease and less UT compensation. Exercises focused on strengthening to improve functional ROM and strength. Pt also showed to have low endurance with strength of the L shoulder. Going forward she would benefit from continued progression to improve her function with her shoulder.   ? Personal Factors and Comorbidities Time since onset of injury/illness/exacerbation;Age   ? PT Frequency 2x / week   ? PT Duration 6 weeks   ? PT Treatment/Interventions ADLs/Self Care Home Management;Aquatic Therapy;Cryotherapy;Electrical Stimulation;Moist Heat;Therapeutic activities;Therapeutic exercise;Neuromuscular re-education;Manual techniques;Patient/family education;Passive range of motion;Dry needling;Taping;Vasopneumatic Device;Joint Manipulations;Spinal Manipulations   ? PT Next Visit Plan continue to focus on shoulder strength and ROM for functional goals.   ? PT Home Exercise Plan [id number]   ? Consulted and Agree with Plan of Care Patient   ? ?  ?  ? ?  ? ? ?Patient will benefit from skilled therapeutic intervention in order to improve the following deficits and impairments:  Decreased range of motion, Impaired UE functional use, Increased muscle spasms, Pain, Decreased activity tolerance, Postural dysfunction, Increased edema, Decreased strength, Impaired flexibility ? ?Visit Diagnosis: ?Stiffness of left shoulder, not elsewhere classified ? ?Acute pain of left shoulder ? ?Muscle weakness (generalized) ? ?Stiffness of left elbow, not elsewhere classified ? ? ? ? ?Problem List ?Patient Active Problem List  ? Diagnosis Date Noted  ? S/P ORIF (open reduction internal fixation) fracture 01/15/2021  ? Hyperkalemia 12/20/2020  ? Primary hyperparathyroidism ([place]) 12/20/2020  ? Need for immunization against influenza 12/20/2020  ?  Hypertension 07/20/2020  ? Hyperlipidemia 07/20/2020  ? Osteoporosis 07/20/2020  ?  Tobacco abuse 07/20/2020  ? ? ?Artist Pais, PTA ?07/26/2021, 12:13 PM ? ?[facility] ?[facility] ?[address] ?[place], [place], [address] ?Phone: [phone]   Fax:  [phone] ? ?Name: Mary Fitzpatrick ?MRN: [id number] ?Date of Birth: 07/08/1940 ? ? ? ?

## 2021-07-30 ENCOUNTER — Encounter: Payer: Self-pay | Admitting: Physical Therapy

## 2021-07-30 ENCOUNTER — Ambulatory Visit: Payer: Medicare Other | Admitting: Physical Therapy

## 2021-07-30 DIAGNOSIS — M6281 Muscle weakness (generalized): Secondary | ICD-10-CM

## 2021-07-30 DIAGNOSIS — M25612 Stiffness of left shoulder, not elsewhere classified: Secondary | ICD-10-CM | POA: Diagnosis not present

## 2021-07-30 DIAGNOSIS — M25512 Pain in left shoulder: Secondary | ICD-10-CM

## 2021-07-30 NOTE — Therapy (Signed)
?OUTPATIENT PHYSICAL THERAPY TREATMENT NOTE ? ? ?Patient Name: Mary Fitzpatrick ?MRN: [id number] ?DOB:1941/01/27, 81 y.o., female ?Today's Date: 07/30/2021 ? ?PCP: Mary Fitzpatrick, [place] ?REFERRING PROVIDER: Justice Britain, MD ? ? PT End of Session - 07/30/21 1021   ? ? Visit Number 30   ? Date for PT Re-Evaluation 08/20/21   ? Authorization Type MCR   ? Progress Note Due on Visit 35   ? PT Start Time 1017   ? PT Stop Time 1056   ? PT Time Calculation (min) 39 min   ? Activity Tolerance Patient tolerated treatment well   ? Behavior During Therapy [facility] for tasks assessed/performed   ? ?  ?  ? ?  ? ? ?Past Medical History:  ?Diagnosis Date  ? Arthritis   ? Hyperlipidemia   ? Hypertension   ? Parathyroid abnormality ([name])   ? nodule on one of the glands  ? ?Past Surgical History:  ?Procedure Laterality Date  ? BREAST EXCISIONAL BIOPSY Left   ? BREAST EXCISIONAL BIOPSY Right   ? GASTRECTOMY    ? Due to a ruptured bleeding ulcer  ? LAPAROTOMY    ? exploratory kink in bowel due to water sking  ? ORIF HUMERUS FRACTURE Left 01/15/2021  ? Procedure: OPEN REDUCTION INTERNAL FIXATION (ORIF) PROXIMAL HUMERUS FRACTURE;  Surgeon: Mary Britain, MD;  Location: [facility];  Service: Orthopedics;  Laterality: Left;  ? TONSILLECTOMY    ? ?Patient Active Problem List  ? Diagnosis Date Noted  ? S/P ORIF (open reduction internal fixation) fracture 01/15/2021  ? Hyperkalemia 12/20/2020  ? Primary hyperparathyroidism ([place]) 12/20/2020  ? Need for immunization against influenza 12/20/2020  ? Hypertension 07/20/2020  ? Hyperlipidemia 07/20/2020  ? Osteoporosis 07/20/2020  ? Tobacco abuse 07/20/2020  ? ? ?REFERRING DIAG: s/p ORIF L humeral fx ? ?THERAPY DIAG:  ?Stiffness of left shoulder, not elsewhere classified ? ?Acute pain of left shoulder ? ?Muscle weakness (generalized) ? ?PERTINENT HISTORY: HTN, surgery 01/15/21, post-op frozen shoulder. ? ?PRECAUTIONS: none ? ?SUBJECTIVE: Pt. Reports she is doing well.  Saw MD last week and he has released  her and did not order more PT.  ? ?PAIN:  ?Are you having pain? No ? ?OBJECTIVE: (objective measures completed at initial evaluation unless otherwise dated) ? Observation/Other Assessments                                 07/09/21 07/23/21  ?  Focus on Therapeutic Outcomes (FOTO)  shoulder 63%    ?      ?  AROM   ?  Left Shoulder Flexion 100 Degrees  100 AROM (120 PROM)  ?  Left Shoulder ABduction 108 Degrees   scaption 100 AROM (115 PROM)  ?  Left Shoulder Internal Rotation --   FIR to sacrum FER to waistband  ?  Left Shoulder External Rotation --   FER to C7 FIR to T2  ?      ?  Strength   ?  Left Shoulder Flexion 3+/5  3+/5  ?  Left Shoulder ABduction 3+/5  3+5  ?  Left Shoulder Internal Rotation 4+/5  4/5  ?  Left Shoulder External Rotation 4-/5  4+/5  ? ? ? ?TODAY'S TREATMENT:  ?07/30/2021 ?Therapeutic Exercise: to improve strength and mobility.  Verbal cues throughout for technique. ?UBE 3 min/ forward/3 backwards ?Cybex Lat pulls 15# x 15, 20# 2 x 10 ?Cybex rows 20# 3 x  10 ?Cybex press 15# 3 x 10  ?Modified plank 3 x 15 sec hold (elbows and knees) ?Bird dogs x 5 - very fatigued at end.  ? ?07/23/2021 Therapeutic Exercise: to improve strength and mobility.  Verbal and tactile cues throughout for technique. ?UBE level 2 x 8 min (67f4b) ?IR stretch with strap 5 x 30 sec hold ?Prone press-ups (cobra pose) x 10 ?Quadruped arm extensions x 5 bil ?Quadruped leg extensions x 5 bil ?Thoracic mobs on foam roller x 10 ?Pec stretch lengthwise on foam roller, followed by shoulder flexion x 10 bil , horizontal abduction x 10 bil, shoulder abduction x 10 bil  ?Supine shoulder protractions 2 x 15 LUE with 2# weight, minA to perform motion correction, supine shoulder flexion 3# 2 x 10 LUE ?Sidelying L shoulder ER with 3# 3 x 10.  ? ?PATIENT EDUCATION: ?Education details: review of HEP, demo on IR stretch with strap, tolerated better than towel ?Person educated: Patient ?Education method: Explanation and Demonstration ?Education  comprehension: verbalized understanding and returned demonstration ? ? ?HOME EXERCISE PROGRAM: ?[id number] ? ?ASSESSMENT: ?Clinical Impression: ?CTemiloluwa Recchiatolerated progression of exercises today well with more resistance machines, reporting primarily fatigue today especially at end of session.  Needed a lot of cuing to perform planks correctly today.  She would benefit from continued skilled therapy to improve shoulder strength and mobility.   ? ? PT Short Term Goals - 05/03/21 1212   ? ?  ? PT SHORT TERM GOAL #1  ? Title Ind with intial HEP   ? Time 2   ? Period Weeks   ? Status Achieved   2/17  ? Target Date 04/16/21   ?  ? PT SHORT TERM GOAL #2  ? Title Decreased pain in the left hand by >=50% at EOD and in the morning.   ? Time 4   ? Period Weeks   ? Status Achieved   2/17  ? ?  ?  ? ?  ? ? ? PT Long Term Goals - 07/09/21 1111   ? ?  ? PT LONG TERM GOAL #1  ? Title Improved left shoulder functional ROM to allow pt to perform ADLS including dressing, grooming and OH activities with minimal difficulty.   ? Time 6   ? Period Weeks   ? Status Partially Met   still difficulty with reaching behind back to dry off with towel and reaching OH with weight  ? Target Date 08/20/21   ?  ? PT LONG TERM GOAL #2  ? Title Improved left UE strength to 4+/5 to allow patient to peform ADLs including carrying, lifting and gardening.   ? Time 6   ? Period Weeks   ? Status On-going   07/09/21- see flow sheet.  ? Target Date 08/20/21   ?  ? PT LONG TERM GOAL #3  ? Title Pt to demonstrate full left elbow extension and be able to WB on elbow without pain.   ? Period Weeks   ? Status Achieved   ? Target Date 05/28/21   ?  ? PT LONG TERM GOAL #4  ? Title Improved left grip strength to at least 10# to improve function of left hand.   ? Time 8   ? Period Weeks   ? Status Achieved   ? Target Date 05/28/21   ?  ? PT LONG TERM GOAL #5  ? Title Ind with advanced HEP to maintain goals made in PT   ? Time 6   ?  Period Weeks   ? Status Partially  Met   05/28/21- met fo current, getting pulleys tomorrow.  ? Target Date 08/20/21   ? ?  ?  ? ?  ? ?PLAN:  ? ?  PT Frequency 2x / week   ?  PT Duration 6 weeks   ?  PT Treatment/Interventions ADLs/Self Care Home Management;Aquatic Therapy;Cryotherapy;Electrical Stimulation;Moist Heat;Therapeutic activities;Therapeutic exercise;Neuromuscular re-education;Manual techniques;Patient/family education;Passive range of motion;Dry needling;Taping;Vasopneumatic Device;Joint Manipulations;Spinal Manipulations   ?  PT Next Visit Plan continue to focus on shoulder strength and ROM for functional goals.   ?  ?   ?  ?  ?   ? Patient will benefit from skilled therapeutic intervention in order to improve the following deficits and impairments:  Decreased range of motion, Impaired UE functional use, Increased muscle spasms, Pain, Decreased activity tolerance, Postural dysfunction, Increased edema, Decreased strength, Impaired flexibility ? ?Rennie Natter, PT, DPT  ?07/30/2021, 11:01 AM ? ?   ?

## 2021-08-02 ENCOUNTER — Ambulatory Visit: Payer: Medicare Other

## 2021-08-02 DIAGNOSIS — M25612 Stiffness of left shoulder, not elsewhere classified: Secondary | ICD-10-CM

## 2021-08-02 DIAGNOSIS — M25622 Stiffness of left elbow, not elsewhere classified: Secondary | ICD-10-CM

## 2021-08-02 DIAGNOSIS — M25512 Pain in left shoulder: Secondary | ICD-10-CM

## 2021-08-02 DIAGNOSIS — M6281 Muscle weakness (generalized): Secondary | ICD-10-CM

## 2021-08-02 NOTE — Therapy (Addendum)
OUTPATIENT PHYSICAL THERAPY TREATMENT NOTE Rationale for Evaluation and Treatment Rehabilitation   Patient Name: Mary Fitzpatrick MRN: [id number] DOB:03-20-1940, 81 y.o., female Today's Date: 08/02/2021  PCP: Marrian Salvage, [place] REFERRING PROVIDER: Justice Britain, MD   PT End of Session - 08/02/21 1059     Visit Number 31    Date for PT Re-Evaluation 08/20/21    Authorization Type MCR    Progress Note Due on Visit 15    PT Start Time 1017    PT Stop Time 1059    PT Time Calculation (min) 42 min    Activity Tolerance Patient tolerated treatment well    Behavior During Therapy WFL for tasks assessed/performed              Past Medical History:  Diagnosis Date   Arthritis    Hyperlipidemia    Hypertension    Parathyroid abnormality ([place])    nodule on one of the glands   Past Surgical History:  Procedure Laterality Date   BREAST EXCISIONAL BIOPSY Left    BREAST EXCISIONAL BIOPSY Right    GASTRECTOMY     Due to a ruptured bleeding ulcer   LAPAROTOMY     exploratory kink in bowel due to water sking   ORIF HUMERUS FRACTURE Left 01/15/2021   Procedure: OPEN REDUCTION INTERNAL FIXATION (ORIF) PROXIMAL HUMERUS FRACTURE;  Surgeon: Justice Britain, MD;  Location: [facility];  Service: Orthopedics;  Laterality: Left;   TONSILLECTOMY     Patient Active Problem List   Diagnosis Date Noted   S/P ORIF (open reduction internal fixation) fracture 01/15/2021   Hyperkalemia 12/20/2020   Primary hyperparathyroidism ([place]) 12/20/2020   Need for immunization against influenza 12/20/2020   Hypertension 07/20/2020   Hyperlipidemia 07/20/2020   Osteoporosis 07/20/2020   Tobacco abuse 07/20/2020    REFERRING DIAG: s/p ORIF L humeral fx  THERAPY DIAG:  Stiffness of left shoulder, not elsewhere classified  Acute pain of left shoulder  Muscle weakness (generalized)  Stiffness of left elbow, not elsewhere classified  PERTINENT HISTORY: HTN, surgery 01/15/21, post-op frozen  shoulder.  PRECAUTIONS: none  SUBJECTIVE: Pt reports having difficulty with reaching behind back and behind head. "My arm gets so tired, with those motions."  PAIN:  Are you having pain? No  OBJECTIVE: (objective measures completed at initial evaluation unless otherwise dated)  Observation/Other Assessments                                 07/09/21 07/23/21 08/02/21    Focus on Therapeutic Outcomes (FOTO)  shoulder 63%              AROM      Left Shoulder Flexion 100 Degrees  100 AROM (120 PROM) 108 deg AROM    Left Shoulder ABduction 108 Degrees   scaption 100 AROM (115 PROM) 103 deg AROM     Left Shoulder Internal Rotation --   FIR to sacrum FER to waistband FIR to L4    Left Shoulder External Rotation --   FER to C7 FIR to T2 FER to T2           Strength      Left Shoulder Flexion 3+/5  3+/5     Left Shoulder ABduction 3+/5  3+5     Left Shoulder Internal Rotation 4+/5  4/5     Left Shoulder External Rotation 4-/5  4+/5      TODAY'S TREATMENT:  08/02/21 Therapeutic Exercise: UBE L3.5 73mn fwd/339m back Standing shoulder flexion with 1# weights 2x10 Lat pulls seated 25# 2x10 L UE cybex row 15# 2x10; B UE 25# x 10 Standing resisted isometrics into L ER behind back with red TB 6x5" S/L L scaption 2x10 2# S/L L ER 2# 2x10  07/30/2021 Therapeutic Exercise: to improve strength and mobility.  Verbal cues throughout for technique. UBE 3 min/ forward/3 backwards Cybex Lat pulls 15# x 15, 20# 2 x 10 Cybex rows 20# 3 x 10 Cybex press 15# 3 x 10  Modified plank 3 x 15 sec hold (elbows and knees) Bird dogs x 5 - very fatigued at end.   07/23/2021 Therapeutic Exercise: to improve strength and mobility.  Verbal and tactile cues throughout for technique. UBE level 2 x 8 min (56f65f) IR stretch with strap 5 x 30 sec hold Prone press-ups (cobra pose) x 10 Quadruped arm extensions x 5 bil Quadruped leg extensions x 5 bil Thoracic mobs on foam roller x 10 Pec stretch lengthwise on foam  roller, followed by shoulder flexion x 10 bil , horizontal abduction x 10 bil, shoulder abduction x 10 bil  Supine shoulder protractions 2 x 15 LUE with 2# weight, minA to perform motion correction, supine shoulder flexion 3# 2 x 10 LUE Sidelying L shoulder ER with 3# 3 x 10.   PATIENT EDUCATION: Education details: review of HEP, demo on IR stretch with strap, tolerated better than towel Person educated: Patient Education method: ExpCustomer service managerucation comprehension: verbalized understanding and returned demonstration   HOME EXERCISE PROGRAM: [id number]  ASSESSMENT: Clinical Impression: CarTempestt Silba progressing slowly towards improving ROM (specifics are under objective). Interventions today focused on strength for postural alignment to assist with functional use of L shoulder. Did unilateral rows to increase L periscap muscle recruitment. Pt was able to finish all interventions but was fatigued after session in L shoulder.   PT Short Term Goals - 05/03/21 1212       PT SHORT TERM GOAL #1   Title Ind with intial HEP    Time 2    Period Weeks    Status Achieved   2/17   Target Date 04/16/21      PT SHORT TERM GOAL #2   Title Decreased pain in the left hand by >=50% at EOD and in the morning.    Time 4    Period Weeks    Status Achieved   2/17             PT Long Term Goals - 07/09/21 1111       PT LONG TERM GOAL #1   Title Improved left shoulder functional ROM to allow pt to perform ADLS including dressing, grooming and OH activities with minimal difficulty.    Time 6    Period Weeks    Status Partially Met   still difficulty with reaching behind back to dry off with towel, reaching OH with weight, and behind head to do hair   Target Date 08/20/21      PT LONG TERM GOAL #2   Title Improved left UE strength to 4+/5 to allow patient to peform ADLs including carrying, lifting and gardening.    Time 6    Period Weeks    Status On-going   07/09/21-  see flow sheet.   Target Date 08/20/21      PT LONG TERM GOAL #3   Title Pt to demonstrate full left elbow extension and be able  to WB on elbow without pain.    Period Weeks    Status Achieved    Target Date 05/28/21      PT LONG TERM GOAL #4   Title Improved left grip strength to at least 10# to improve function of left hand.    Time 8    Period Weeks    Status Achieved    Target Date 05/28/21      PT LONG TERM GOAL #5   Title Ind with advanced HEP to maintain goals made in PT    Time 6    Period Weeks    Status Partially Met   05/28/21- met fo current, getting pulleys tomorrow.   Target Date 08/20/21            PLAN:     PT Frequency 2x / week     PT Duration 6 weeks     PT Treatment/Interventions ADLs/Self Care Home Management;Aquatic Therapy;Cryotherapy;Electrical Stimulation;Moist Heat;Therapeutic activities;Therapeutic exercise;Neuromuscular re-education;Manual techniques;Patient/family education;Passive range of motion;Dry needling;Taping;Vasopneumatic Device;Joint Manipulations;Spinal Manipulations     PT Next Visit Plan continue to focus on shoulder strength and ROM for functional goals.                Patient will benefit from skilled therapeutic intervention in order to improve the following deficits and impairments:  Decreased range of motion, Impaired UE functional use, Increased muscle spasms, Pain, Decreased activity tolerance, Postural dysfunction, Increased edema, Decreased strength, Impaired flexibility  Artist Pais, PTA 08/02/2021, 11:57 AM

## 2021-08-06 ENCOUNTER — Ambulatory Visit: Payer: Medicare Other

## 2021-08-06 DIAGNOSIS — M6281 Muscle weakness (generalized): Secondary | ICD-10-CM

## 2021-08-06 DIAGNOSIS — M25612 Stiffness of left shoulder, not elsewhere classified: Secondary | ICD-10-CM

## 2021-08-06 DIAGNOSIS — M25512 Pain in left shoulder: Secondary | ICD-10-CM

## 2021-08-06 DIAGNOSIS — M25622 Stiffness of left elbow, not elsewhere classified: Secondary | ICD-10-CM

## 2021-08-06 NOTE — Therapy (Signed)
OUTPATIENT PHYSICAL THERAPY TREATMENT NOTE Rationale for Evaluation and Treatment Rehabilitation   Patient Name: Mary Fitzpatrick MRN: [id number] DOB:25-Jan-1941, 81 y.o., female Today's Date: 08/06/2021  PCP: Marrian Salvage, [place] PROVIDER: Justice Britain, MD   PT End of Session - 08/06/21 1347     Visit Number 32    Date for PT Re-Evaluation 08/20/21    Authorization Type MCR    Progress Note Due on Visit 6    PT Start Time 1315    PT Stop Time 1358    PT Time Calculation (min) 43 min    Activity Tolerance Patient tolerated treatment well    Behavior During Therapy WFL for tasks assessed/performed               Past Medical History:  Diagnosis Date   Arthritis    Hyperlipidemia    Hypertension    Parathyroid abnormality ([place])    nodule on one of the glands   Past Surgical History:  Procedure Laterality Date   BREAST EXCISIONAL BIOPSY Left    BREAST EXCISIONAL BIOPSY Right    GASTRECTOMY     Due to a ruptured bleeding ulcer   LAPAROTOMY     exploratory kink in bowel due to water sking   ORIF HUMERUS FRACTURE Left 01/15/2021   Procedure: OPEN REDUCTION INTERNAL FIXATION (ORIF) PROXIMAL HUMERUS FRACTURE;  Surgeon: Justice Britain, MD;  Location: [facility];  Service: Orthopedics;  Laterality: Left;   TONSILLECTOMY     Patient Active Problem List   Diagnosis Date Noted   S/P ORIF (open reduction internal fixation) fracture 01/15/2021   Hyperkalemia 12/20/2020   Primary hyperparathyroidism ([place]) 12/20/2020   Need for immunization against influenza 12/20/2020   Hypertension 07/20/2020   Hyperlipidemia 07/20/2020   Osteoporosis 07/20/2020   Tobacco abuse 07/20/2020    REFERRING DIAG: s/p ORIF L humeral fx  THERAPY DIAG:  Stiffness of left shoulder, not elsewhere classified  Acute pain of left shoulder  Muscle weakness (generalized)  Stiffness of left elbow, not elsewhere classified  PERTINENT HISTORY: HTN, surgery 01/15/21, post-op frozen  shoulder.  PRECAUTIONS: none  SUBJECTIVE: "I can tell my arm doesn't get as tired as it used to."  PAIN:  Are you having pain? No  OBJECTIVE: (objective measures completed at initial evaluation unless otherwise dated)  Observation/Other Assessments                                 07/09/21 07/23/21 08/02/21    Focus on Therapeutic Outcomes (FOTO)  shoulder 63%              AROM      Left Shoulder Flexion 100 Degrees  100 AROM (120 PROM) 108 deg AROM    Left Shoulder ABduction 108 Degrees   scaption 100 AROM (115 PROM) 103 deg AROM     Left Shoulder Internal Rotation --   FIR to sacrum FER to waistband FIR to L4    Left Shoulder External Rotation --   FER to C7 FIR to T2 FER to T2           Strength      Left Shoulder Flexion 3+/5  3+/5     Left Shoulder ABduction 3+/5  3+5     Left Shoulder Internal Rotation 4+/5  4/5     Left Shoulder External Rotation 4-/5  4+/5      TODAY'S TREATMENT:  08/06/21 Therapeutic Exercise: UBE L4.0  49mn fwd/350m back Lat pulls with wide grip 25# 2x10 Standing extension with lat bar x 15 10# Chest press 10# 2x10 Standing row 15# x 10 - cues to avoid leaning  Standing shoulder flexion with 1# weights 2x15 AA ER/IR behind back with strap 10x3" L UE rows 15# 2x10  08/02/21 Therapeutic Exercise: UBE L3.5 [id number]/45mi75mack Standing shoulder flexion with 1# weights 2x10 Lat pulls seated 25# 2x10 L UE cybex row 15# 2x10; B UE 25# x 10 Standing resisted isometrics into L ER behind back with red TB 6x5" S/L L scaption 2x10 2# S/L L ER 2# 2x10  07/30/2021 Therapeutic Exercise: to improve strength and mobility.  Verbal cues throughout for technique. UBE 3 min/ forward/3 backwards Cybex Lat pulls 15# x 15, 20# 2 x 10 Cybex rows 20# 3 x 10 Cybex press 15# 3 x 10  Modified plank 3 x 15 sec hold (elbows and knees) Bird dogs x 5 - very fatigued at end.    PATIENT EDUCATION: Education details: review of HEP, demo on IR stretch with strap, tolerated  better than towel Person educated: Patient Education method: ExplCustomer service managercation comprehension: verbalized understanding and returned demonstration   HOME EXERCISE PROGRAM: [id number]  ASSESSMENT: Clinical Impression: CaroAlishia Leboonstrates a good tolerance for the progression of exercises. She continues to have a hard time facilitating L posterior shoulder muscles with postural strengthening so cueing still required with this. She continues to demonstrate improved ability with reaching arm OH.    PT Short Term Goals - 05/03/21 1212       PT SHORT TERM GOAL #1   Title Ind with intial HEP    Time 2    Period Weeks    Status Achieved   2/17   Target Date 04/16/21      PT SHORT TERM GOAL #2   Title Decreased pain in the left hand by >=50% at EOD and in the morning.    Time 4    Period Weeks    Status Achieved   2/17             PT Long Term Goals - 07/09/21 1111       PT LONG TERM GOAL #1   Title Improved left shoulder functional ROM to allow pt to perform ADLS including dressing, grooming and OH activities with minimal difficulty.    Time 6    Period Weeks    Status Partially Met   still difficulty with reaching behind back to dry off with towel, reaching OH with weight, and behind head to do hair   Target Date 08/20/21      PT LONG TERM GOAL #2   Title Improved left UE strength to 4+/5 to allow patient to peform ADLs including carrying, lifting and gardening.    Time 6    Period Weeks    Status On-going   07/09/21- see flow sheet.   Target Date 08/20/21      PT LONG TERM GOAL #3   Title Pt to demonstrate full left elbow extension and be able to WB on elbow without pain.    Period Weeks    Status Achieved    Target Date 05/28/21      PT LONG TERM GOAL #4   Title Improved left grip strength to at least 10# to improve function of left hand.    Time 8    Period Weeks    Status Achieved    Target Date 05/28/21  PT LONG TERM GOAL #5    Title Ind with advanced HEP to maintain goals made in PT    Time 6    Period Weeks    Status Partially Met   05/28/21- met fo current, getting pulleys tomorrow.   Target Date 08/20/21            PLAN:     PT Frequency 2x / week     PT Duration 6 weeks     PT Treatment/Interventions ADLs/Self Care Home Management;Aquatic Therapy;Cryotherapy;Electrical Stimulation;Moist Heat;Therapeutic activities;Therapeutic exercise;Neuromuscular re-education;Manual techniques;Patient/family education;Passive range of motion;Dry needling;Taping;Vasopneumatic Device;Joint Manipulations;Spinal Manipulations     PT Next Visit Plan continue to focus on shoulder strength and ROM for functional goals.                Patient will benefit from skilled therapeutic intervention in order to improve the following deficits and impairments:  Decreased range of motion, Impaired UE functional use, Increased muscle spasms, Pain, Decreased activity tolerance, Postural dysfunction, Increased edema, Decreased strength, Impaired flexibility  Artist Pais, PTA 08/06/2021, 2:57 PM

## 2021-08-09 ENCOUNTER — Ambulatory Visit: Payer: Medicare Other

## 2021-08-09 DIAGNOSIS — M25622 Stiffness of left elbow, not elsewhere classified: Secondary | ICD-10-CM

## 2021-08-09 DIAGNOSIS — M25612 Stiffness of left shoulder, not elsewhere classified: Secondary | ICD-10-CM | POA: Diagnosis not present

## 2021-08-09 DIAGNOSIS — M6281 Muscle weakness (generalized): Secondary | ICD-10-CM

## 2021-08-09 DIAGNOSIS — M25512 Pain in left shoulder: Secondary | ICD-10-CM

## 2021-08-09 NOTE — Therapy (Signed)
OUTPATIENT PHYSICAL THERAPY TREATMENT NOTE Rationale for Evaluation and Treatment Rehabilitation   Patient Name: Mary Fitzpatrick MRN: [id number] DOB:02/02/1941, 81 y.o., female Today's Date: 08/09/2021  PCP: Marrian Salvage, [place] REFERRING PROVIDER: Justice Britain, MD   PT End of Session - 08/09/21 1106     Visit Number 33    Date for PT Re-Evaluation 08/20/21    Authorization Type MCR    Progress Note Due on Visit 60    PT Start Time 1020    PT Stop Time 1100    PT Time Calculation (min) 40 min    Activity Tolerance Patient tolerated treatment well    Behavior During Therapy WFL for tasks assessed/performed                Past Medical History:  Diagnosis Date   Arthritis    Hyperlipidemia    Hypertension    Parathyroid abnormality ([place])    nodule on one of the glands   Past Surgical History:  Procedure Laterality Date   BREAST EXCISIONAL BIOPSY Left    BREAST EXCISIONAL BIOPSY Right    GASTRECTOMY     Due to a ruptured bleeding ulcer   LAPAROTOMY     exploratory kink in bowel due to water sking   ORIF HUMERUS FRACTURE Left 01/15/2021   Procedure: OPEN REDUCTION INTERNAL FIXATION (ORIF) PROXIMAL HUMERUS FRACTURE;  Surgeon: Justice Britain, MD;  Location: [facility];  Service: Orthopedics;  Laterality: Left;   TONSILLECTOMY     Patient Active Problem List   Diagnosis Date Noted   S/P ORIF (open reduction internal fixation) fracture 01/15/2021   Hyperkalemia 12/20/2020   Primary hyperparathyroidism ([place]) 12/20/2020   Need for immunization against influenza 12/20/2020   Hypertension 07/20/2020   Hyperlipidemia 07/20/2020   Osteoporosis 07/20/2020   Tobacco abuse 07/20/2020    REFERRING DIAG: s/p ORIF L humeral fx  THERAPY DIAG:  Stiffness of left shoulder, not elsewhere classified  Acute pain of left shoulder  Muscle weakness (generalized)  Stiffness of left elbow, not elsewhere classified  PERTINENT HISTORY: HTN, surgery 01/15/21, post-op frozen  shoulder.  PRECAUTIONS: none  SUBJECTIVE: Pt reports soreness today.  PAIN:  Are you having pain? No  OBJECTIVE: (objective measures completed at initial evaluation unless otherwise dated)  Observation/Other Assessments                                 07/09/21 07/23/21 08/02/21 08/09/21    Focus on Therapeutic Outcomes (FOTO)  shoulder 63%                AROM       Left Shoulder Flexion 100 Degrees  100 AROM (120 PROM) 108 deg AROM 118 AROM    Left Shoulder ABduction 108 Degrees   scaption 100 AROM (115 PROM) 103 deg AROM  115 AROM    Left Shoulder Internal Rotation --   FIR to sacrum FER to waistband FIR to L4 FIR to L2    Left Shoulder External Rotation --   FER to C7 FIR to T2 FER to T2 FER to T2            Strength       Left Shoulder Flexion 3+/5  3+/5      Left Shoulder ABduction 3+/5  3+5      Left Shoulder Internal Rotation 4+/5  4/5      Left Shoulder External Rotation 4-/5  4+/5  TODAY'S TREATMENT:  08/09/21 Therapeutic Exercise: UBE L4.0 50mn fwd/386m back Chest press 15# 2x10 OHP with 2lb weight 2x10 Standing lat pull 15# 2x10 Bicep curls 5# x 8 reps L cabinet reaches with 2# weight 2x10 L UE row 15# 2x10  08/06/21 Therapeutic Exercise: UBE L4.0 75m67mfwd/75mi575mack Lat pulls with wide grip 25# 2x10 Standing extension with lat bar x 15 10# Chest press 10# 2x10 Standing row 15# x 10 - cues to avoid leaning  Standing shoulder flexion with 1# weights 2x15 AA ER/IR behind back with strap 10x3" L UE rows 15# 2x10  08/02/21 Therapeutic Exercise: UBE L3.5 75min49md/75min 39mk Standing shoulder flexion with 1# weights 2x10 Lat pulls seated 25# 2x10 L UE cybex row 15# 2x10; B UE 25# x 10 Standing resisted isometrics into L ER behind back with red TB 6x5" S/L L scaption 2x10 2# S/L L ER 2# 2x10    PATIENT EDUCATION: Education details: review of HEP, demo on IR stretch with strap, tolerated better than towel Person educated: Patient Education method:  ExplanCustomer service managertion comprehension: verbalized understanding and returned demonstration   HOME EXERCISE PROGRAM: [id number]  ASSESSMENT: Clinical Impression: Pt continues to demonstrate improved ROM of her L shoulder. She did note that she did yard work yesterday where she used her L shoulder in provocative positions and only notes soreness today. Continued working on postural strengthening to improve alignment for optimal ROM of shoulder and strength to help with elevation against gravity.    PT Short Term Goals - 05/03/21 1212       PT SHORT TERM GOAL #1   Title Ind with intial HEP    Time 2    Period Weeks    Status Achieved   2/17   Target Date 04/16/21      PT SHORT TERM GOAL #2   Title Decreased pain in the left hand by >=50% at EOD and in the morning.    Time 4    Period Weeks    Status Achieved   2/17             PT Long Term Goals - 07/09/21 1111       PT LONG TERM GOAL #1   Title Improved left shoulder functional ROM to allow pt to perform ADLS including dressing, grooming and OH activities with minimal difficulty.    Time 6    Period Weeks    Status Partially Met   still difficulty with reaching behind back to dry off with towel, reaching OH with weight, and behind head to do hair   Target Date 08/20/21      PT LONG TERM GOAL #2   Title Improved left UE strength to 4+/5 to allow patient to peform ADLs including carrying, lifting and gardening.    Time 6    Period Weeks    Status On-going   07/09/21- see flow sheet.   Target Date 08/20/21      PT LONG TERM GOAL #3   Title Pt to demonstrate full left elbow extension and be able to WB on elbow without pain.    Period Weeks    Status Achieved    Target Date 05/28/21      PT LONG TERM GOAL #4   Title Improved left grip strength to at least 10# to improve function of left hand.    Time 8    Period Weeks    Status Achieved    Target Date 05/28/21  PT LONG TERM GOAL #5   Title Ind  with advanced HEP to maintain goals made in PT    Time 6    Period Weeks    Status Partially Met   05/28/21- met fo current, getting pulleys tomorrow.   Target Date 08/20/21            PLAN:     PT Frequency 2x / week     PT Duration 6 weeks     PT Treatment/Interventions ADLs/Self Care Home Management;Aquatic Therapy;Cryotherapy;Electrical Stimulation;Moist Heat;Therapeutic activities;Therapeutic exercise;Neuromuscular re-education;Manual techniques;Patient/family education;Passive range of motion;Dry needling;Taping;Vasopneumatic Device;Joint Manipulations;Spinal Manipulations     PT Next Visit Plan continue to focus on shoulder strength and ROM for functional goals.                Patient will benefit from skilled therapeutic intervention in order to improve the following deficits and impairments:  Decreased range of motion, Impaired UE functional use, Increased muscle spasms, Pain, Decreased activity tolerance, Postural dysfunction, Increased edema, Decreased strength, Impaired flexibility  Artist Pais, PTA 08/09/2021, 11:59 AM

## 2021-08-13 ENCOUNTER — Ambulatory Visit: Payer: Medicare Other

## 2021-08-14 ENCOUNTER — Telehealth (HOSPITAL_BASED_OUTPATIENT_CLINIC_OR_DEPARTMENT_OTHER): Payer: Self-pay

## 2021-08-15 ENCOUNTER — Encounter: Payer: Self-pay | Admitting: Physical Therapy

## 2021-08-15 ENCOUNTER — Ambulatory Visit: Payer: Medicare Other | Attending: Orthopedic Surgery | Admitting: Physical Therapy

## 2021-08-15 DIAGNOSIS — M25512 Pain in left shoulder: Secondary | ICD-10-CM | POA: Insufficient documentation

## 2021-08-15 DIAGNOSIS — M6281 Muscle weakness (generalized): Secondary | ICD-10-CM | POA: Diagnosis present

## 2021-08-15 DIAGNOSIS — M25612 Stiffness of left shoulder, not elsewhere classified: Secondary | ICD-10-CM | POA: Insufficient documentation

## 2021-08-15 NOTE — Therapy (Signed)
OUTPATIENT PHYSICAL THERAPY TREATMENT NOTE Rationale for Evaluation and Treatment Rehabilitation   PHYSICAL THERAPY DISCHARGE SUMMARY  Visits from Start of Care: 34  Current functional level related to goals / functional outcomes: Improved L shoulder ROM and strength, functional use   Remaining deficits: Decreased L shoulder ROM, difficulty with overhead ADLs   Education / Equipment: HEP  Plan: Patient agrees to discharge.  Patient is being discharged due to meeting the stated rehab goals.       Patient Name: Mary Fitzpatrick MRN: [id number] DOB:[birth year], 81 y.o., female Today's Date: 08/15/2021  PCP: Marrian Salvage, [place] REFERRING PROVIDER: Justice Britain, MD   PT End of Session - 08/15/21 1013     Visit Number 34    Date for PT Re-Evaluation 08/20/21    Authorization Type MCR    Progress Note Due on Visit 71    PT Start Time 0174    PT Stop Time 1100    PT Time Calculation (min) 45 min    Activity Tolerance Patient tolerated treatment well    Behavior During Therapy WFL for tasks assessed/performed                Past Medical History:  Diagnosis Date   Arthritis    Hyperlipidemia    Hypertension    Parathyroid abnormality ([place])    nodule on one of the glands   Past Surgical History:  Procedure Laterality Date   BREAST EXCISIONAL BIOPSY Left    BREAST EXCISIONAL BIOPSY Right    GASTRECTOMY     Due to a ruptured bleeding ulcer   LAPAROTOMY     exploratory kink in bowel due to water sking   ORIF HUMERUS FRACTURE Left 01/15/2021   Procedure: OPEN REDUCTION INTERNAL FIXATION (ORIF) PROXIMAL HUMERUS FRACTURE;  Surgeon: Justice Britain, MD;  Location: [facility];  Service: Orthopedics;  Laterality: Left;   TONSILLECTOMY     Patient Active Problem List   Diagnosis Date Noted   S/P ORIF (open reduction internal fixation) fracture 01/15/2021   Hyperkalemia 12/20/2020   Primary hyperparathyroidism ([place]) 12/20/2020   Need for immunization against  influenza 12/20/2020   Hypertension 07/20/2020   Hyperlipidemia 07/20/2020   Osteoporosis 07/20/2020   Tobacco abuse 07/20/2020    REFERRING DIAG: s/p ORIF L humeral fx  THERAPY DIAG:  Stiffness of left shoulder, not elsewhere classified  Acute pain of left shoulder  Muscle weakness (generalized)  PERTINENT HISTORY: HTN, surgery 01/15/21, post-op frozen shoulder.  PRECAUTIONS: none  SUBJECTIVE: Pt reports no problems with shoulder, using it to lift bags of dirt with gardening yesterday, not even thinking about it.   PAIN:  Are you having pain? No  OBJECTIVE: (objective measures completed at initial evaluation unless otherwise dated)  Observation/Other Assessments                                 07/09/21 07/23/21 08/02/21 08/09/21 08/15/21    Focus on Therapeutic Outcomes (FOTO)  shoulder 63%     Shoulder 59%             AROM        Left Shoulder Flexion 100 Degrees  100 AROM (120 PROM) 108 deg AROM 118 AROM   120 deg AROM    Left Shoulder ABduction 108 Degrees   scaption 100 AROM (115 PROM) 103 deg AROM  115 AROM     Left Shoulder Internal Rotation --   FIR to sacrum  FER to waistband FIR to L4 FIR to L2     Left Shoulder External Rotation --   FER to C7 FIR to T2 FER to T2 FER to T2              Strength        Left Shoulder Flexion 3+/5  3+/5   4/5    Left Shoulder ABduction 3+/5  3+5   4/5    Left Shoulder Internal Rotation 4+/5  4/5   4/5    Left Shoulder External Rotation 4-/5  4+/5   4/5     TODAY'S TREATMENT:  08/15/21 Therapeutic Exercise: to improve strength and mobility.   UBE L4.0 662mn fwd/3462m back L cabinet reaches with 2# weight 2x10 (second shelf) Chest press 15# 2x10 Standing lat pull 15# 1x10, 20# 1 x 10  Cybex Rows 20# 2 x 15  Cobra Press-ups 2 x 10 Shoulder flexion 2# 2 x 10 bil  Review of current HEP, progression and gym based exercise.    08/09/21 Therapeutic Exercise: UBE L4.0 62m17mfwd/62mi5mack Chest press 15# 2x10 OHP with 2lb weight  2x10 Standing lat pull 15# 2x10 Bicep curls 5# x 8 reps L cabinet reaches with 2# weight 2x10 L UE row 15# 2x10  08/06/21 Therapeutic Exercise: UBE L4.0 62min62md/62min 64mk Lat pulls with wide grip 25# 2x10 Standing extension with lat bar x 15 10# Chest press 10# 2x10 Standing row 15# x 10 - cues to avoid leaning  Standing shoulder flexion with 1# weights 2x15 AA ER/IR behind back with strap 10x3" L UE rows 15# 2x10   PATIENT EDUCATION: Education details: progression strengthening program Person educated: Patient Education method: ExplanCustomer service managertion comprehension: verbalized understanding and returned demonstration   HOME EXERCISE PROGRAM: [id number]  ASSESSMENT: Clinical Impression: Mary Fitzpatrick good progress overall towards goals, she now has functional ROM in L shoulder, able to reach to overhead shelf.  She continues to improve strength in L shoulder.  She has met or almost met all goals, has been released from orthopedist, and independent with HEP.  She is ready and agreeable to discharge.  Discussed gym based exercise, including aquatics, to continue improving strength and functional mobility.    PT Short Term Goals - 05/03/21 1212       PT SHORT TERM GOAL #1   Title Ind with intial HEP    Time 2    Period Weeks    Status Achieved   2/17   Target Date 04/16/21      PT SHORT TERM GOAL #2   Title Decreased pain in the left hand by >=50% at EOD and in the morning.    Time 4    Period Weeks    Status Achieved   2/17             PT Long Term Goals - 07/09/21 1111       PT LONG TERM GOAL #1   Title Improved left shoulder functional ROM to allow pt to perform ADLS including dressing, grooming and OH activities with minimal difficulty.    Time 6    Period Weeks    Status Partially Met   still difficulty with reaching behind back to dry off with towel, reaching OH with weight, and behind head to do hair   Target Date 08/20/21       PT LONG TERM GOAL #2   Title Improved left UE strength to 4+/5 to allow patient to peform  ADLs including carrying, lifting and gardening.    Time 6    Period Weeks    Status On-going   07/09/21- see flow sheet.   Target Date 08/20/21      PT LONG TERM GOAL #3   Title Pt to demonstrate full left elbow extension and be able to WB on elbow without pain.    Period Weeks    Status Achieved    Target Date 05/28/21      PT LONG TERM GOAL #4   Title Improved left grip strength to at least 10# to improve function of left hand.    Time 8    Period Weeks    Status Achieved    Target Date 05/28/21      PT LONG TERM GOAL #5   Title Ind with advanced HEP to maintain goals made in PT    Time 6    Period Weeks    Status Partially Met   05/28/21- met fo current, getting pulleys tomorrow.   Target Date 08/20/21            PLAN:     PT Frequency 2x / week     PT Duration 6 weeks     PT Treatment/Interventions ADLs/Self Care Home Management;Aquatic Therapy;Cryotherapy;Electrical Stimulation;Moist Heat;Therapeutic activities;Therapeutic exercise;Neuromuscular re-education;Manual techniques;Patient/family education;Passive range of motion;Dry needling;Taping;Vasopneumatic Device;Joint Manipulations;Spinal Manipulations     PT Next Visit Plan discharge               Patient will benefit from skilled therapeutic intervention in order to improve the following deficits and impairments:  Decreased range of motion, Impaired UE functional use, Increased muscle spasms, Pain, Decreased activity tolerance, Postural dysfunction, Increased edema, Decreased strength, Impaired flexibility  Rennie Natter, PT, DPT  08/15/2021, 11:22 AM

## 2021-09-16 ENCOUNTER — Encounter (HOSPITAL_BASED_OUTPATIENT_CLINIC_OR_DEPARTMENT_OTHER): Payer: Self-pay

## 2021-09-16 ENCOUNTER — Ambulatory Visit (HOSPITAL_BASED_OUTPATIENT_CLINIC_OR_DEPARTMENT_OTHER)
Admission: RE | Admit: 2021-09-16 | Discharge: 2021-09-16 | Disposition: A | Discharge status: Home / Self Care | Payer: Medicare Other | Source: Ambulatory Visit | Attending: Family | Admitting: Family

## 2021-09-16 DIAGNOSIS — Z1231 Encounter for screening mammogram for malignant neoplasm of breast: Secondary | ICD-10-CM | POA: Diagnosis present

## 2021-10-16 ENCOUNTER — Encounter

## 2021-10-26 ENCOUNTER — Inpatient Hospital Stay: Admit: 2021-10-26 | Payer: PRIVATE HEALTH INSURANCE | Attending: Gastroenterology | Primary: Internal Medicine

## 2021-10-26 ENCOUNTER — Ambulatory Visit: Payer: PRIVATE HEALTH INSURANCE | Primary: Internal Medicine

## 2021-10-26 ENCOUNTER — Encounter

## 2021-10-26 DIAGNOSIS — K862 Cyst of pancreas: Secondary | ICD-10-CM

## 2021-10-26 MED ORDER — IOPAMIDOL 61 % IV SOLN
61 % | Freq: Once | INTRAVENOUS | Status: AC | PRN
Start: 2021-10-26 — End: 2021-10-26
  Administered 2021-10-26: 15:00:00 80 mL via INTRAVENOUS

## 2021-10-26 MED FILL — ISOVUE-300 61 % IV SOLN: 61 % | INTRAVENOUS | Qty: 80

## 2022-01-10 ENCOUNTER — Ambulatory Visit (INDEPENDENT_AMBULATORY_CARE_PROVIDER_SITE_OTHER): Payer: Medicare Other | Admitting: Family

## 2022-01-10 ENCOUNTER — Encounter: Payer: Self-pay | Admitting: Family

## 2022-01-10 VITALS — BP 160/90 | HR 73 | Temp 97.8°F | Ht 66.0 in | Wt 115.6 lb

## 2022-01-10 DIAGNOSIS — Z23 Encounter for immunization: Secondary | ICD-10-CM

## 2022-01-10 DIAGNOSIS — I10 Essential (primary) hypertension: Secondary | ICD-10-CM

## 2022-01-10 NOTE — Progress Notes (Signed)
Mary Fitzpatrick is a 81 y.o. female with the following history as recorded in [employer]:  Patient Active Problem List   Diagnosis Date Noted   S/P ORIF (open reduction internal fixation) fracture 01/15/2021   Hyperkalemia 12/20/2020   Primary hyperparathyroidism ([place]) 12/20/2020   Need for immunization against influenza 12/20/2020   Hypertension 07/20/2020   Hyperlipidemia 07/20/2020   Osteoporosis 07/20/2020   Tobacco abuse 07/20/2020    Current Outpatient Medications  Medication Sig Dispense Refill   amLODipine (NORVASC) 2.5 MG tablet Take 1 tablet (2.5 mg total) by mouth daily. 90 tablet 3   atorvastatin (LIPITOR) 20 MG tablet Take 1 tablet (20 mg total) by mouth every morning. 90 tablet 3   cholecalciferol (VITAMIN D3) 25 MCG (1000 UNIT) tablet Take 1,000 Units by mouth daily.     No current facility-administered medications for this visit.    Allergies: Hctz [hydrochlorothiazide]  Past Medical History:  Diagnosis Date   Arthritis    Hyperlipidemia    Hypertension    Parathyroid abnormality ([place])    nodule on one of the glands    Past Surgical History:  Procedure Laterality Date   BREAST EXCISIONAL BIOPSY Left    BREAST EXCISIONAL BIOPSY Right    GASTRECTOMY     Due to a ruptured bleeding ulcer   LAPAROTOMY     exploratory kink in bowel due to water sking   ORIF HUMERUS FRACTURE Left 01/15/2021   Procedure: OPEN REDUCTION INTERNAL FIXATION (ORIF) PROXIMAL HUMERUS FRACTURE;  Surgeon: Justice Britain, MD;  Location: [facility];  Service: Orthopedics;  Laterality: Left;   TONSILLECTOMY      No family history on file.  Social History   Tobacco Use   Smoking status: Some Days    Years: 40.00    Types: Cigarettes   Smokeless tobacco: Never  Substance Use Topics   Alcohol use: Yes    Alcohol/week: 2.0 standard drinks of alcohol    Types: 2 Glasses of wine per week    Comment: most nights    Subjective:   Follow up on hypertension; known white coat HTN; does not check  pressure at home; has been under increased stress planning for large family gathering next week; Denies any chest pain, shortness of breath, blurred vision or headache    Objective:  Vitals:   01/10/22 1325 01/10/22 1331 01/10/22 1346  BP: (!) 150/80 (!) 184/110 (!) 160/90  Pulse: 73    Temp: 97.8 F (36.6 C)    TempSrc: Oral    SpO2: 92%    Weight: 115 lb 9.6 oz (52.4 kg)    Height: 5\' 6"  (1.676 m)      General: Well developed, well nourished, in no acute distress  Skin : Warm and dry.  Head: Normocephalic and atraumatic  Lungs: Respirations unlabored; clear to auscultation bilaterally without wheeze, rales, rhonchi  CVS exam: normal rate and regular rhythm.  Neurologic: Alert and oriented; speech intact; face symmetrical; moves all extremities well; CNII-XII intact without focal deficit   Assessment:  1. Primary hypertension   2. White coat syndrome with diagnosis of hypertension   3. Need for immunization against influenza     Plan:  ? Control vs white coat hypertension; have asked patient to purchase home cuff and start checking her readings over the weekend; she is to call back with readings early next week and will most likely increase Amlodipine to 5 mg daily;   No follow-ups on file.  Orders Placed This Encounter  Procedures  Flu Vaccine QUAD High Dose(Fluad)    Requested Prescriptions    No prescriptions requested or ordered in this encounter

## 2022-01-10 NOTE — Patient Instructions (Addendum)
Please go ahead and purchase a home cuff ( arm cuff) and start checking your blood pressure regularly over the upcoming weekend; please call back with those readings early next week so we can try and determine if this is white coat hypertension or we need to change your medications.  We typically recommend OMRON cuff;

## 2022-01-14 ENCOUNTER — Telehealth: Payer: Self-pay | Admitting: Family

## 2022-01-14 NOTE — Telephone Encounter (Signed)
Pt called to update Mickel Baas with the following BP readings as instructed per her last appt:  10.28.23 - 127/80 10.29.23 - 119/75 10.30.23 - 121/76 10.31.23 - 126/80

## 2022-01-14 NOTE — Telephone Encounter (Signed)
I have called the pt and relayed the message from the provider. Also we have scheduled the f/u appt.

## 2022-01-14 NOTE — Telephone Encounter (Signed)
These are the updated BP readings. Please advise if there are any additional recommendations.

## 2022-03-17 HISTORY — PX: CATARACT EXTRACTION, BILATERAL: SHX1313

## 2022-05-27 ENCOUNTER — Emergency Department: Admit: 2022-05-28 | Payer: MEDICARE | Primary: Internal Medicine

## 2022-05-27 DIAGNOSIS — R251 Tremor, unspecified: Secondary | ICD-10-CM

## 2022-05-27 DIAGNOSIS — R52 Pain, unspecified: Secondary | ICD-10-CM

## 2022-05-27 NOTE — ED Notes (Signed)
Pt to xray     Leanne Chang, RN  05/27/22 [phone]

## 2022-05-27 NOTE — ED Notes (Signed)
Urine sent to lab.     Beacher May, RN  05/27/22 [phone]

## 2022-05-27 NOTE — ED Provider Notes (Signed)
[facility]  Emergency Department Treatment Report        Patient: Kathryn [name] Age: 82 y.o. Sex: female    Date of Birth: 25-Jul-1940 Admit Date: 05/27/2022 PCP: Royanne Foots, MD   MRN: [id number]  CSN: [id number]     Room: H02/H02 Time Dictated: 2:45 AM        Dragon medical dictation software was used for portions of this report.  Unintended transcription errors may occur.      Chief Complaint   Chief Complaint   Patient presents with    OTHER    Altered Mental Status       History of Present Illness   This is a 82 y.o. female brought in today by medics.  Report from medics is that she had a transient mental status change, apparently she was in usual baseline status which is pleasantly demented.  She had a transient episode of shaking, lasted about 30 minutes and resolved.  Indicates that she was impacting her state of health, complained of feeling generally unwell, complained of "pain all over, and seem to sweat, had some shaking and then it resolved.  Currently she is back to her normal baseline.    She has not been sick over the last few days, she has had no vomiting, no stools or diarrhea, she has had no active complaints.  Patient has dementia so she is a poor historian but actively denies any complaints, she has no active pain.    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 Jude - ICD defribrillator/pacemaker       Social History     Social History     Socioeconomic History    Marital status: Married     Spouse name: Not on file    Number of children: Not on file    Years of education: Not on file    Highest education level: Not on file   Occupational History    Not on file   Tobacco Use    Smoking status: Former     Current packs/day: 0.00     Types: Cigarettes     Quit date: 07/01/1967  Years since quitting: 54.9    Smokeless tobacco: Never   Substance and Sexual Activity    Alcohol use: No    Drug use: No    Sexual activity: Not on file   Other Topics Concern    Not on file   Social History Narrative    Not on file     Social Determinants of Health     Financial Resource Strain: Not on file   Food Insecurity: Not on file   Transportation Needs: Not on file   Physical Activity: Not on file   Stress: Not on file   Social Connections: Not on file   Intimate Partner Violence: Not on file   Housing Stability: Not on file       Family History     Family History   Problem Relation Age of Onset    Diabetes Mother     Hypertension Mother     Diabetes Father     Stroke Father     Hypertension Father        Current Medications     No current facility-administered medications for this encounter.     Current Outpatient Medications   Medication Sig Dispense Refill    allopurinol (ZYLOPRIM) 100 MG tablet Take 100 mg by mouth daily      atorvastatin (LIPITOR) 20 MG tablet Take 20 mg by mouth      donepezil (ARICEPT) 10 MG tablet nightly.      furosemide (LASIX) 20 MG tablet Take 20 mg by mouth daily      mirtazapine (REMERON) 15  MG tablet Take 15 mg by mouth      sacubitril-valsartan (ENTRESTO) 49-51 MG per tablet Take 1 tablet by mouth 2 times daily      spironolactone (ALDACTONE) 25 MG tablet Take 25 mg by mouth daily      traZODone (DESYREL) 50 MG tablet Take by mouth         Allergies     Allergies   Allergen Reactions    Penicillins Itching    Sulfa Antibiotics Hives       Physical Exam   Patient Vitals for the past 24 hrs:   Temp Pulse Resp BP SpO2   05/27/22 2300 97.1 F (36.2 C) -- -- -- --   05/27/22 2135 -- 72 16 (!) 130/98 99 %     Physical Exam  Constitutional:       Appearance: Normal appearance.   HENT:      Head: Normocephalic and atraumatic.      Nose: No rhinorrhea.      Mouth/Throat:      Mouth: Mucous membranes are moist.   Eyes:      General: No visual field deficit.     Conjunctiva/sclera: Conjunctivae normal.   Cardiovascular:      Rate and Rhythm: Normal rate and regular rhythm.      Pulses: Normal pulses.      Heart sounds: Normal heart sounds.   Pulmonary:      Effort: Pulmonary effort is normal.      Breath sounds: Normal breath sounds.   Abdominal:      General: Abdomen is flat.      Palpations: Abdomen is soft.      Tenderness: There is no abdominal tenderness.   Musculoskeletal:         General: No swelling or tenderness.   Skin:     General: Skin is warm and dry.   Neurological:  General: No focal deficit present.      Mental Status: She is alert.      GCS: GCS eye subscore is 4. GCS verbal subscore is 5. GCS motor subscore is 6.      Cranial Nerves: No cranial nerve deficit, dysarthria or facial asymmetry.      Sensory: No sensory deficit.      Motor: No weakness.      Coordination: Coordination normal.   Psychiatric:         Mood and Affect: Mood normal.              Impression and Management Plan   Pleasantly demented overall very well-appearing 82 year old female without any acute symptoms here today for transient shaking and diaphoresis with complaints of transient pain all over, that has all since  resolved.  She has no concerning findings on examination but etiology is not totally clear will do screening CBC, BMP, ECG, troponin, she has no lateralizing neurologic deficits, family does not indicate they felt this was a seizure, and she had clear postictal phase, I do not feel that there is clear evidence there is a central etiology, do not feel at this point in time given her benign neuro examination that she has at any TIA/CVA, or any pathology that requires  central imaging.      Diagnostic Studies   Lab:     Results for orders placed or performed during the hospital encounter of 05/27/22   COVID-19, Flu A/B, and RSV Combo    Specimen: Nasopharyngeal Swab   Result Value Ref Range    SARS-CoV-2 NEGATIVE NEGATIVE      Influenza A H1 (Seasonal) PCR NEGATIVE NEGATIVE      Influenza virus B RNA NEGATIVE NEGATIVE      RSV A/B PCR NEGATIVE NEGATIVE     CBC with Auto Differential   Result Value Ref Range    WBC 7.3 4.0 - 11.0 1000/mm3    RBC 3.91 3.60 - 5.20 M/uL    Hemoglobin 11.7 11.0 - 16.0 gm/dl    Hematocrit 34.4 (L) 35.0 - 47.0 %    MCV 88.0 80.0 - 98.0 fL    MCH 29.9 25.4 - 34.6 pg    MCHC 34.0 30.0 - 36.0 gm/dl    Platelets 270 140 - 450 1000/mm3    MPV 8.5 6.0 - 10.0 fL    RDW 44.8 36.4 - 46.3      Nucleated RBCs 0 0 - 0      Immature Granulocytes 0.4 0.0 - 3.0 %    Neutrophils Segmented 56.7 34 - 64 %    Lymphocytes 29.8 28 - 48 %    Monocytes 7.9 1 - 13 %    Eosinophils 4.4 0 - 5 %    Basophils 0.8 0 - 3 %   Troponin   Result Value Ref Range    Troponin, High Sensitivity 6 0 - 34 ng/L   Troponin   Result Value Ref Range    Troponin, High Sensitivity 6 0 - 34 ng/L   Basic Metabolic Panel   Result Value Ref Range    Potassium 4.4 3.5 - 5.1 mEq/L    Chloride 102 98 - 107 mEq/L    Sodium 129 (L) 136 - 145 mEq/L    CO2 16 (L) 20 - 31 mEq/L    Glucose 83 74 - 106 mg/dl    BUN 7 (L) 9 - 23 mg/dl    Creatinine 1.42 (H) 0.55 -  1.02 mg/dl    GFR African American 46.0      GFR Non-African American 38      Calcium  9.0 8.7 - 10.4 mg/dl    Anion Gap 11 5 - 15 mmol/L   POCT Urinalysis no Micro   Result Value Ref Range    Glucose, Ur Negative NEGATIVE,Negative mg/dl    Bilirubin, Urine Negative NEGATIVE,Negative      Ketones, Urine Negative NEGATIVE,Negative mg/dl    Specific Gravity, Urine 1.020 1.005 - 1.030      Blood, Urine Trace-intact (A) NEGATIVE,Negative      pH, Urine 7.0 5 - 9      Protein, Urine Negative NEGATIVE,Negative mg/dl    Urobilinogen, Urine 0.2 0.0 - 1.0 EU/dl    Nitrite, Urine Negative NEGATIVE,Negative      Leukocyte Esterase, Urine Negative NEGATIVE,Negative      Color, UA Yellow      Clarity, UA Clear         Imaging:    XR CHEST 1 VIEW    Result Date: 05/28/2022  Examination:  Frontal view of the chest. INDICATION: ams COMPARISON: 03/01/2021 WORKSTATION ID: [id number] FINDINGS: No alveolar consolidations, congestive changes or pleural effusions. Cardiomediastinal silhouette normal in size and contour. Right ventral chest wall AICD device with leads overlying the right atrium, right ventricle and coronary sinus.     IMPRESSION: No acute cardiopulmonary process. Electronically signed by: Elnita Maxwell, MD 05/28/2022 12:09 AM EDT          Workstation ID: [id number]              EKG: Based upon my personal interpretation the ECG shows an atrial sensed paced rhythm with no evidence of ST elevation or depression, negative for ischemia by Sgarbossa criteria.    Imaging: Based on my personal interpretation, the chest x-ray shows no evidence of acute intrathoracic anomaly.    Other studies:  My interpretation of other studies is that they show, among other things, troponins are negative x 2 BUN/creatinine reassuring at 7/1.42 this is consistent with her baseline.    ED Course     ED Course as of 05/28/22 0245   Wed May 28, 2022   0104 Patient has been symptom-free now for 3-1/2 hours, UA with only trace blood, white count normal, troponins negative x 2 with EKG with a paced rhythm. [JR]      ED Course User  Index  [JR] Jacklynn Barnacle Walker Shadow, MD        INDEPENDENT HISTORIAN:  History and/or plan development assisted by: Family      Comorbidities impacting Evaluation and Management: Advanced age, dementia        Medications - No data to display    NARRATIVE:  Patient has remained stable well-appearing and asymptomatic while here in the ED.  Family is hide noted to go home, given her preserved kidney function, negative cardiac troponins, stable EKG, will discharged home with encourage primary care follow-up and return precautions.          Final Diagnosis       ICD-10-CM    1. Pain  R52       2. Shaking  R25.1           Disposition   Discharge to home      Carolan Shiver, MD [facility]  May 28, 2022    My signature above authenticates this document and my orders, the final    diagnosis (es), discharge prescription (s), and instructions in  the Epic    record.  If you have any questions please contact [phone].     Nursing notes have been reviewed by the physician/ advanced practice    Clinician.                             Mora Bellman, MD  05/28/22 [phone]

## 2022-05-27 NOTE — ED Triage Notes (Signed)
Pt brought via EMS from home with CO transient confusion and "shaking"

## 2022-05-28 ENCOUNTER — Inpatient Hospital Stay
Admit: 2022-05-28 | Discharge: 2022-05-28 | Disposition: A | Discharge status: Home / Self Care | Payer: MEDICARE | Attending: Emergency Medicine

## 2022-05-28 LAB — BASIC METABOLIC PANEL
Anion Gap: 11 mmol/L (ref 5–15)
BUN: 7 mg/dl — ABNORMAL LOW (ref 9–23)
CO2: 16 mEq/L — ABNORMAL LOW (ref 20–31)
Calcium: 9 mg/dl (ref 8.7–10.4)
Chloride: 102 mEq/L (ref 98–107)
Creatinine: 1.42 mg/dl — ABNORMAL HIGH (ref 0.55–1.02)
GFR African American: 46
GFR Non-African American: 38
Glucose: 83 mg/dl (ref 74–106)
Potassium: 4.4 mEq/L (ref 3.5–5.1)
Sodium: 129 mEq/L — ABNORMAL LOW (ref 136–145)

## 2022-05-28 LAB — TROPONIN
Troponin, High Sensitivity: 6 ng/L (ref 0–34)
Troponin, High Sensitivity: 6 ng/L (ref 0–34)

## 2022-05-28 LAB — CBC WITH AUTO DIFFERENTIAL
Basophils: 0.8 % (ref 0–3)
Eosinophils: 4.4 % (ref 0–5)
Hematocrit: 34.4 % — ABNORMAL LOW (ref 35.0–47.0)
Hemoglobin: 11.7 gm/dl (ref 11.0–16.0)
Immature Granulocytes: 0.4 % (ref 0.0–3.0)
Lymphocytes: 29.8 % (ref 28–48)
MCH: 29.9 pg (ref 25.4–34.6)
MCHC: 34 gm/dl (ref 30.0–36.0)
MCV: 88 fL (ref 80.0–98.0)
MPV: 8.5 fL (ref 6.0–10.0)
Monocytes: 7.9 % (ref 1–13)
Neutrophils Segmented: 56.7 % (ref 34–64)
Nucleated RBCs: 0 (ref 0–0)
Platelets: 270 10*3/uL (ref 140–450)
RBC: 3.91 M/uL (ref 3.60–5.20)
RDW: 44.8 (ref 36.4–46.3)
WBC: 7.3 10*3/uL (ref 4.0–11.0)

## 2022-05-28 LAB — POCT URINALYSIS DIPSTICK
Bilirubin, Urine: NEGATIVE
Glucose, Ur: NEGATIVE mg/dl
Ketones, Urine: NEGATIVE mg/dl
Leukocyte Esterase, Urine: NEGATIVE
Nitrite, Urine: NEGATIVE
Protein, Urine: NEGATIVE mg/dl
Specific Gravity, Urine: 1.02 (ref 1.005–1.030)
Urobilinogen, Urine: 0.2 EU/dl (ref 0.0–1.0)
pH, Urine: 7 (ref 5–9)

## 2022-05-28 LAB — EKG 12-LEAD
Atrial Rate: 77 {beats}/min
Calculated P Axis: 54 degrees
Calculated R Axis: 67 degrees
Calculated T Axis: 76 degrees
P-R Interval: 136 ms
Q-T Interval: 396 ms
QRS Duration: 102 ms
QTC Calculation (Bezet): 448 ms
Ventricular Rate: 77 {beats}/min

## 2022-05-28 LAB — COVID-19, FLU A/B, AND RSV COMBO
Influenza A H1 (Seasonal) PCR: NEGATIVE
Influenza virus B RNA: NEGATIVE
RSV A/B PCR: NEGATIVE
SARS-CoV-2: NEGATIVE

## 2022-05-28 NOTE — Discharge Instructions (Signed)
Please follow up with your primary care doctor this week for re-examination. Please return to the ER for re-evaluation should you develop any new or concerning symptoms.     Results for orders placed or performed during the hospital encounter of 05/27/22   COVID-19, Flu A/B, and RSV Combo    Specimen: Nasopharyngeal Swab   Result Value Ref Range    SARS-CoV-2 NEGATIVE NEGATIVE      Influenza A H1 (Seasonal) PCR NEGATIVE NEGATIVE      Influenza virus B RNA NEGATIVE NEGATIVE      RSV A/B PCR NEGATIVE NEGATIVE     CBC with Auto Differential   Result Value Ref Range    WBC 7.3 4.0 - 11.0 1000/mm3    RBC 3.91 3.60 - 5.20 M/uL    Hemoglobin 11.7 11.0 - 16.0 gm/dl    Hematocrit 34.4 (L) 35.0 - 47.0 %    MCV 88.0 80.0 - 98.0 fL    MCH 29.9 25.4 - 34.6 pg    MCHC 34.0 30.0 - 36.0 gm/dl    Platelets 270 140 - 450 1000/mm3    MPV 8.5 6.0 - 10.0 fL    RDW 44.8 36.4 - 46.3      Nucleated RBCs 0 0 - 0      Immature Granulocytes 0.4 0.0 - 3.0 %    Neutrophils Segmented 56.7 34 - 64 %    Lymphocytes 29.8 28 - 48 %    Monocytes 7.9 1 - 13 %    Eosinophils 4.4 0 - 5 %    Basophils 0.8 0 - 3 %   Troponin   Result Value Ref Range    Troponin, High Sensitivity 6 0 - 34 ng/L   Troponin   Result Value Ref Range    Troponin, High Sensitivity 6 0 - 34 ng/L   POCT Urinalysis no Micro   Result Value Ref Range    Glucose, Ur Negative NEGATIVE,Negative mg/dl    Bilirubin, Urine Negative NEGATIVE,Negative      Ketones, Urine Negative NEGATIVE,Negative mg/dl    Specific Gravity, Urine 1.020 1.005 - 1.030      Blood, Urine Trace-intact (A) NEGATIVE,Negative      pH, Urine 7.0 5 - 9      Protein, Urine Negative NEGATIVE,Negative mg/dl    Urobilinogen, Urine 0.2 0.0 - 1.0 EU/dl    Nitrite, Urine Negative NEGATIVE,Negative      Leukocyte Esterase, Urine Negative NEGATIVE,Negative      Color, UA Yellow      Clarity, UA Clear

## 2022-05-28 NOTE — ED Notes (Signed)
1:44 AM  05/28/22    Discharge instructions given to patient with verbalization of understanding.  Pt accompanied by famil.  Pt discharged with the following prescriptions      Medication List        ASK your doctor about these medications      allopurinol 100 MG tablet  Commonly known as: ZYLOPRIM     atorvastatin 20 MG tablet  Commonly known as: LIPITOR     donepezil 10 MG tablet  Commonly known as: ARICEPT     Entresto 49-51 MG per tablet  Generic drug: sacubitril-valsartan     furosemide 20 MG tablet  Commonly known as: LASIX     mirtazapine 15 MG tablet  Commonly known as: REMERON     spironolactone 25 MG tablet  Commonly known as: ALDACTONE     traZODone 50 MG tablet  Commonly known as: DESYREL          .  Pt discharged to home.    Leanne Chang, RN      Leanne Chang, RN  05/28/22 [phone]

## 2022-07-08 ENCOUNTER — Ambulatory Visit (INDEPENDENT_AMBULATORY_CARE_PROVIDER_SITE_OTHER): Payer: Medicare Other | Admitting: Family

## 2022-07-08 ENCOUNTER — Encounter: Payer: Self-pay | Admitting: Family

## 2022-07-08 VITALS — BP 134/86 | HR 76 | Ht 66.0 in | Wt 119.8 lb

## 2022-07-08 DIAGNOSIS — E210 Primary hyperparathyroidism: Secondary | ICD-10-CM | POA: Diagnosis not present

## 2022-07-08 DIAGNOSIS — Z13220 Encounter for screening for lipoid disorders: Secondary | ICD-10-CM | POA: Diagnosis not present

## 2022-07-08 DIAGNOSIS — I10 Essential (primary) hypertension: Secondary | ICD-10-CM | POA: Diagnosis not present

## 2022-07-08 DIAGNOSIS — E782 Mixed hyperlipidemia: Secondary | ICD-10-CM | POA: Diagnosis not present

## 2022-07-08 DIAGNOSIS — M810 Age-related osteoporosis without current pathological fracture: Secondary | ICD-10-CM

## 2022-07-08 DIAGNOSIS — Z1231 Encounter for screening mammogram for malignant neoplasm of breast: Secondary | ICD-10-CM

## 2022-07-08 MED ORDER — ATORVASTATIN CALCIUM 20 MG PO TABS: 20.0000 mg | ORAL_TABLET | Freq: Every morning | ORAL | 3 refills | Status: DC

## 2022-07-08 MED ORDER — AMLODIPINE BESYLATE 2.5 MG PO TABS: 2.5000 mg | ORAL_TABLET | Freq: Every day | ORAL | 3 refills | Status: DC

## 2022-07-08 NOTE — Progress Notes (Signed)
Mary Fitzpatrick is a 82 y.o. female with the following history as recorded in EpicCare:  Patient Active Problem List   Diagnosis Date Noted   S/P ORIF (open reduction internal fixation) fracture 01/15/2021   Hyperkalemia 12/20/2020   Primary hyperparathyroidism 12/20/2020   Need for immunization against influenza 12/20/2020   Hypertension 07/20/2020   Hyperlipidemia 07/20/2020   Osteoporosis 07/20/2020   Tobacco abuse 07/20/2020    Current Outpatient Medications  Medication Sig Dispense Refill   cholecalciferol (VITAMIN D3) 25 MCG (1000 UNIT) tablet Take 1,000 Units by mouth daily.     amLODipine (NORVASC) 2.5 MG tablet Take 1 tablet (2.5 mg total) by mouth daily. 90 tablet 3   atorvastatin (LIPITOR) 20 MG tablet Take 1 tablet (20 mg total) by mouth every morning. 90 tablet 3   No current facility-administered medications for this visit.    Allergies: Hctz [hydrochlorothiazide]  Past Medical History:  Diagnosis Date   Arthritis    Hyperlipidemia    Hypertension    Parathyroid abnormality    nodule on one of the glands    Past Surgical History:  Procedure Laterality Date   BREAST EXCISIONAL BIOPSY Left    BREAST EXCISIONAL BIOPSY Right    GASTRECTOMY     Due to a ruptured bleeding ulcer   LAPAROTOMY     exploratory kink in bowel due to water sking   ORIF HUMERUS FRACTURE Left 01/15/2021   Procedure: OPEN REDUCTION INTERNAL FIXATION (ORIF) PROXIMAL HUMERUS FRACTURE;  Surgeon: Francena Hanly, MD;  Location: [facility];  Service: Orthopedics;  Laterality: Left;   TONSILLECTOMY      No family history on file.  Social History   Tobacco Use   Smoking status: Some Days    Years: 40    Types: Cigarettes   Smokeless tobacco: Never  Substance Use Topics   Alcohol use: Yes    Alcohol/week: 2.0 standard drinks of alcohol    Types: 2 Glasses of wine per week    Comment: most nights    Subjective:   Follow up on hypertension and hyperlipidemia; no acute concerns today; would like  to get labs updated;    Objective:  Vitals:   07/08/22 1305  BP: 134/86  Pulse: 76  SpO2: 98%  Weight: 119 lb 12.8 oz (54.3 kg)  Height:  (1.676 m)    General: Well developed, well nourished, in no acute distress  Skin : Warm and dry.  Head: Normocephalic and atraumatic  Eyes: Sclera and conjunctiva clear; pupils round and reactive to light; extraocular movements intact  Ears: External normal; canals clear; tympanic membranes normal  Oropharynx: Pink, supple. No suspicious lesions  Neck: Supple without thyromegaly, adenopathy  Lungs: Respirations unlabored; clear to auscultation bilaterally without wheeze, rales, rhonchi  CVS exam: normal rate and regular rhythm.  Neurologic: Alert and oriented; speech intact; face symmetrical; moves all extremities well; CNII-XII intact without focal deficit   Assessment:  1. Primary hypertension   2. Primary hyperparathyroidism Chronic  3. Mixed hyperlipidemia   4. Lipid screening   5. Visit for screening mammogram   6. Osteoporosis without current pathological fracture, unspecified osteoporosis type     Plan:  Stable; check CBC, CMP; refill updated on Amlodipine; Has been evaluated and released by endocrinology; Check lipid panel today; Order updated- due in July 2024; Order updated for DEXA- plan to do with mammogram in July 2024.   No follow-ups on file.  Orders Placed This Encounter  Procedures   MM Digital Screening  Standing Status:   Future    Standing Expiration Date:   07/08/2023    Scheduling Instructions:     Due in July 2024- please schedule bone density at same time    Order Specific Question:   Reason for Exam (SYMPTOM  OR DIAGNOSIS REQUIRED)    Answer:   screening mammogram    Order Specific Question:   Preferred imaging location?    Answer:   [facility]   DG Bone Density    Standing Status:   Future    Standing Expiration Date:   07/08/2023    Scheduling Instructions:     Please schedule with  mammogram for July 2024    Order Specific Question:   Reason for Exam (SYMPTOM  OR DIAGNOSIS REQUIRED)    Answer:   osteopenia    Order Specific Question:   Preferred imaging location?    Answer:   [facility]   CBC with Differential/Platelet   Comp Met (CMET)   Lipid panel    Requested Prescriptions   Signed Prescriptions Disp Refills   amLODipine (NORVASC) 2.5 MG tablet 90 tablet 3    Sig: Take 1 tablet (2.5 mg total) by mouth daily.   atorvastatin (LIPITOR) 20 MG tablet 90 tablet 3    Sig: Take 1 tablet (20 mg total) by mouth every morning.

## 2022-07-09 ENCOUNTER — Telehealth (HOSPITAL_BASED_OUTPATIENT_CLINIC_OR_DEPARTMENT_OTHER): Payer: Self-pay

## 2022-07-09 LAB — CBC WITH DIFFERENTIAL/PLATELET
Basophils Absolute: 0.1 10*3/uL (ref 0.0–0.1)
Basophils Relative: 1.4 % (ref 0.0–3.0)
Eosinophils Absolute: 0 10*3/uL (ref 0.0–0.7)
Eosinophils Relative: 0.3 % (ref 0.0–5.0)
HCT: 42 % (ref 36.0–46.0)
Hemoglobin: 13.8 g/dL (ref 12.0–15.0)
Lymphocytes Relative: 19.7 % (ref 12.0–46.0)
Lymphs Abs: 1.9 10*3/uL (ref 0.7–4.0)
MCHC: 32.9 g/dL (ref 30.0–36.0)
MCV: 94.2 fl (ref 78.0–100.0)
Monocytes Absolute: 0.8 10*3/uL (ref 0.1–1.0)
Monocytes Relative: 8.5 % (ref 3.0–12.0)
Neutro Abs: 6.6 10*3/uL (ref 1.4–7.7)
Neutrophils Relative %: 70.1 % (ref 43.0–77.0)
Platelets: 248 10*3/uL (ref 150.0–400.0)
RBC: 4.46 Mil/uL (ref 3.87–5.11)
RDW: 15.8 % — ABNORMAL HIGH (ref 11.5–15.5)
WBC: 9.4 10*3/uL (ref 4.0–10.5)

## 2022-07-09 LAB — LIPID PANEL
Cholesterol: 192 mg/dL (ref 0–200)
HDL: 102 mg/dL (ref >39.00)
LDL Cholesterol: 79 mg/dL (ref 0–99)
NonHDL: 90.13
Total CHOL/HDL Ratio: 2
Triglycerides: 57 mg/dL (ref 0.0–149.0)
VLDL: 11.4 mg/dL (ref 0.0–40.0)

## 2022-07-09 LAB — COMPREHENSIVE METABOLIC PANEL
ALT: 13 U/L (ref 0–35)
AST: 21 U/L (ref 0–37)
Albumin: 4.5 g/dL (ref 3.5–5.2)
Alkaline Phosphatase: 72 U/L (ref 39–117)
BUN: 24 mg/dL — ABNORMAL HIGH (ref 6–23)
CO2: 26 mEq/L (ref 19–32)
Calcium: 11.1 mg/dL — ABNORMAL HIGH (ref 8.4–10.5)
Chloride: 103 mEq/L (ref 96–112)
Creatinine, Ser: 0.96 mg/dL (ref 0.40–1.20)
GFR: 55.56 mL/min — ABNORMAL LOW (ref >60.00)
Glucose, Bld: 81 mg/dL (ref 70–99)
Potassium: 4.7 mEq/L (ref 3.5–5.1)
Sodium: 140 mEq/L (ref 135–145)
Total Bilirubin: 0.7 mg/dL (ref 0.2–1.2)
Total Protein: 7.3 g/dL (ref 6.0–8.3)

## 2022-07-10 ENCOUNTER — Telehealth: Payer: Self-pay | Admitting: Family

## 2022-07-10 NOTE — Telephone Encounter (Signed)
Copied from CRM [id number]. Topic: Medicare AWV >> Jul 10, 2022 11:07 AM Payton Doughty wrote: Reason for CRM: Called patient to schedule Medicare Annual Wellness Visit (AWV). Left message for patient to call back and schedule Medicare Annual Wellness Visit (AWV).  Last date of AWV: 07/22/21  Please schedule an appointment at any time with Donne Anon, CMA  .  If any questions, please contact me.  Thank you ,  Verlee Rossetti; Care Guide Ambulatory Clinical Support [place] [facility] Direct Dial: [phone]

## 2022-08-01 ENCOUNTER — Emergency Department: Admit: 2022-08-02 | Payer: MEDICARE | Primary: Internal Medicine

## 2022-08-01 DIAGNOSIS — M79605 Pain in left leg: Secondary | ICD-10-CM

## 2022-08-01 DIAGNOSIS — M545 Low back pain, unspecified: Secondary | ICD-10-CM

## 2022-08-01 NOTE — ED Triage Notes (Signed)
Pt ambulatory into triage with c/o left low back, buttocks pain that radiates down the LLE for about 3 days. Pt tender to palpation at Lt hip.

## 2022-08-01 NOTE — ED Provider Notes (Incomplete)
[facility]  Emergency Department Treatment Report    Patient: Kathryn [name] Age: 82 y.o. Sex: female    Date of Birth: [birth year] Admit Date: 08/01/2022 PCP: Sena Slate, MD   MRN: [id number]  CSN: [id number]  ATTENDING: Salomon Fick*    Room: H02/H02 Time Dictated: 10:06 PM APP: Balinda Quails, PA-C     Chief Complaint   Chief Complaint   Patient presents with   . Leg Pain   . Back Pain       History of Present Illness   82 y.o. female with extensive past medical history significant for GERD, diabetes, heart failure, CAD, arrhythmia, arthritis, left breast cancer status post mastectomy, hypertension and ICD presenting to the emergency department for      Review of Systems   See HPI    Past Medical/Surgical History     Past Medical History:   Diagnosis Date   . Arrhythmia    . Arthritis    . CAD (coronary artery disease)    . Dementia ([facility])    . Diabetes ([facility])    . GERD (gastroesophageal reflux disease)    . Heart failure ([facility])    . History of left breast cancer     mastectomy   . Hypertension    . 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 BREAST SURGERY      Left Mastectomy   . COLONOSCOPY N/A 10/22/2016    COLONOSCOPY, SURVEILLANCE performed by Willaim Rayas, [facility] at [facility] ENDOSCOPY   . GYN      Hysterectomy   . HEENT      Tosillectomy   . PACEMAKER  2010    [employer] Jude - ICD defribrillator/pacemaker     ***  Social History     Social History     Socioeconomic History   . Marital status: Married   Tobacco Use   . Smoking status: Former     Current packs/day: 0.00     Types: Cigarettes     Quit date: 07/01/1967      Years since quitting: 55.1   . Smokeless tobacco: Never   Substance and Sexual Activity   . Alcohol use: No   . Drug use: No     ***  Family History     Family History   Problem Relation Age of Onset   . Diabetes Mother    . Hypertension Mother    . Diabetes Father    . Stroke Father    . Hypertension Father      ***  Current Medications     Previous Medications    ALLOPURINOL (ZYLOPRIM) 100 MG TABLET    Take 100 mg by mouth daily    ATORVASTATIN (LIPITOR) 20 MG TABLET    Take 20 mg by mouth    DONEPEZIL (ARICEPT) 10 MG TABLET    nightly.    FUROSEMIDE (LASIX) 20 MG TABLET    Take 20 mg by mouth daily    MIRTAZAPINE (REMERON) 15 MG TABLET    Take 15 mg by mouth    SACUBITRIL-VALSARTAN (ENTRESTO) 49-51 MG PER TABLET  Take 1 tablet by mouth 2 times daily    SPIRONOLACTONE (ALDACTONE) 25 MG TABLET    Take 25 mg by mouth daily    TRAZODONE (DESYREL) 50 MG TABLET    Take by mouth         Allergies   Penicillins and Sulfa antibiotics      Physical Exam     ED Triage Vitals [08/01/22 2029]   BP Temp Temp Source Pulse Respirations SpO2 Height Weight - Scale   120/66 97.3 F (36.3 C) Oral 76 18 99 % 1.651 m (5\' 5" ) 63.5 kg (140 lb)           Physical Exam    Impression and Management Plan   ***  DDx: Including but not limited to ***  Diagnostic Studies   No results found for any visits on 08/01/22.    Pre-hospital ECG: My interpretation is ***    ECG: Interpreted by myself and Santiago-Rodriguez, Katherine Basset*. My interpretation is ***    Cardiac monitor: Ordered for ***. My interpretation is ***    Imaging studies independently interpreted by myself and Salomon Fick*. My interpretation is ***  ED Course/Medical Decision Making   ***        RECORDS REVIEWED:  I reviewed the patient's previous records here at [facility] and available outside facilities and note that ***    EXTERNAL RESULTS REVIEWED:  ***    INDEPENDENT HISTORIAN:  History and/or plan development assisted by: ***    Severe exacerbation or progression of  chronic illness:***      Threat to body function without evaluation and management:  ***      SOCIAL DETERMINANTS  impacting Evaluation and Management: Provider availability, stress, health literacy      Comorbidities impacting Evaluation and Management: ***      Critical Care Time (if necessary) ***    Medications - No data to display  Final Diagnosis   No diagnosis found.    Disposition   ***    Balinda Quails, PA-C  Aug 01, 2022    ***I hereby certify this patient for admission based upon medical necessity as noted above.    The patient was personally evaluated by myself and discussed with Salomon Fick* who agrees with the above assessment and plan.    My signature above authenticates this document and my orders, the final diagnosis (es), discharge prescription (s), and instructions in the Epic record. If you have any questions please contact [phone].     Nursing notes have been reviewed by the physician/ advanced practice clinician.    Dragon medical dictation software was used for portions of this report. Unintended voice recognition errors may occur.

## 2022-08-01 NOTE — ED Notes (Signed)
Pt in radiology     Shirlean Schlein, RN  08/01/22 2249

## 2022-08-02 ENCOUNTER — Inpatient Hospital Stay
Admit: 2022-08-02 | Discharge: 2022-08-02 | Disposition: A | Discharge status: Home / Self Care | Payer: MEDICARE | Attending: Emergency Medicine

## 2022-08-02 LAB — POCT URINALYSIS DIPSTICK
Bilirubin, Urine: NEGATIVE
Blood, Urine: NEGATIVE
Glucose, Ur: NEGATIVE mg/dl
Ketones, Urine: NEGATIVE mg/dl
Nitrite, Urine: NEGATIVE
Specific Gravity, UA: 1.02 (ref 1.005–1.030)
Urobilinogen, Urine: 0.2 EU/dl (ref 0.0–1.0)
pH, Urine: 5.5 (ref 5–9)

## 2022-08-02 LAB — MICROSCOPIC URINALYSIS

## 2022-08-02 MED ORDER — BENZONATATE 100 MG PO CAPS
100 | ORAL_CAPSULE | Freq: Two times a day (BID) | ORAL | 0 refills | Status: AC | PRN
Start: 2022-08-02 — End: 2022-08-09

## 2022-08-02 MED ORDER — ACETAMINOPHEN 500 MG PO TABS
500 | ORAL | Status: AC
Start: 2022-08-02 — End: 2022-08-01
  Administered 2022-08-02: 03:00:00 1000 mg via ORAL

## 2022-08-02 MED FILL — ACETAMINOPHEN EXTRA STRENGTH 500 MG PO TABS: 500 MG | ORAL | Qty: 2

## 2022-08-02 NOTE — ED Notes (Signed)
Pt discharged with husband and daughter     Claudean Severance, [place]  08/02/22 0134

## 2022-08-02 NOTE — ED Notes (Signed)
Urine sent up to lab     Claudean Severance, [place]  08/02/22 [phone]

## 2022-08-02 NOTE — ED Notes (Signed)
Pt resting in bed  Husband and daughter at bedside       Claudean Severance, [place]  08/02/22 [phone]

## 2022-08-02 NOTE — Discharge Instructions (Signed)
Your x-rays are negative for any kind of fracture or dislocation.  Your urine is negative for any infection, we did send it for culture, we will call you if we need to put you on any antibiotics based on the results of the urine culture.  Please follow-up with your primary care provider this week and return to the emergency department if you have new or worsening symptoms.    Results for orders placed or performed during the hospital encounter of 08/01/22   XR CHEST (2 VW)    Narrative    Examination:  Frontal and lateral views of the chest.     INDICATION: Shortness of Breath    COMPARISON: 05/27/2022.    WORKSTATION ID: [id number]    FINDINGS: No alveolar consolidations, congestive changes or pleural effusions.  Cardiomediastinal silhouette normal in size and contour. AICD device unchanged.    Degenerative changes of the thoracic spine.      Impression    IMPRESSION: No acute cardiopulmonary process.     Electronically signed by: Mikal Plane, MD 08/01/2022 11:26 PM EDT            Workstation ID: [id number]     XR LUMBAR SPINE (2-3 VIEWS)    Narrative    Examination:  XR LUMBAR SPINE (2-3 VIEWS)    INDICATION: back pain    COMPARISON: None    WORKSTATION ID: [id number]      Impression    IMPRESSION: No fracture. Vertebral body heights maintained. Alignment within  normal limits. Mild degenerative changes of the facet joints within the mid and  lower lumbar segments. At least moderate to severe degenerative changes of the  pubic symphysis.    Electronically signed by: Mikal Plane, MD 08/01/2022 11:27 PM EDT            Workstation ID: [id number]     XR HIP 2-3 VW W PELVIS LEFT    Narrative    Examination:  XR HIP 2-3 VW W PELVIS LEFT    INDICATION: hip pain    COMPARISON: None    WORKSTATION ID: [id number]      Impression    IMPRESSION: No fracture. No acute abnormalities. Mild degenerative changes of  the hip joints. Prominent degenerative changes of the pubic symphysis.    Electronically signed by:  Mikal Plane, MD 08/01/2022 11:28 PM EDT            Workstation ID: [id number]     POCT Urinalysis no Micro   Result Value Ref Range    Glucose, Ur Negative NEGATIVE,Negative mg/dl    Bilirubin, Urine Negative NEGATIVE,Negative      Ketones, Urine Negative NEGATIVE,Negative mg/dl    Specific Gravity, UA 1.020 1.005 - 1.030      Blood, Urine Negative NEGATIVE,Negative      pH, Urine 5.5 5 - 9      Protein, Urine Trace (A) NEGATIVE,Negative mg/dl    Urobilinogen, Urine 0.2 0.0 - 1.0 EU/dl    Nitrite, Urine Negative NEGATIVE,Negative      Leukocyte Esterase, Urine Trace (A) NEGATIVE,Negative      Color, UA Yellow      Clarity, UA Clear

## 2022-08-13 IMAGING — MG DIGITAL SCREENING BILAT W/ CAD
4 series · 4 of 4 positions shown · non-contrast
Comparison: Previous exam(s).

CLINICAL DATA: Screening.

EXAM:
DIGITAL SCREENING BILATERAL MAMMOGRAM WITH CAD
TECHNIQUE: Bilateral screening digital craniocaudal and mediolateral oblique
mammograms were obtained. The images were evaluated with
computer-aided detection.

[L CC]
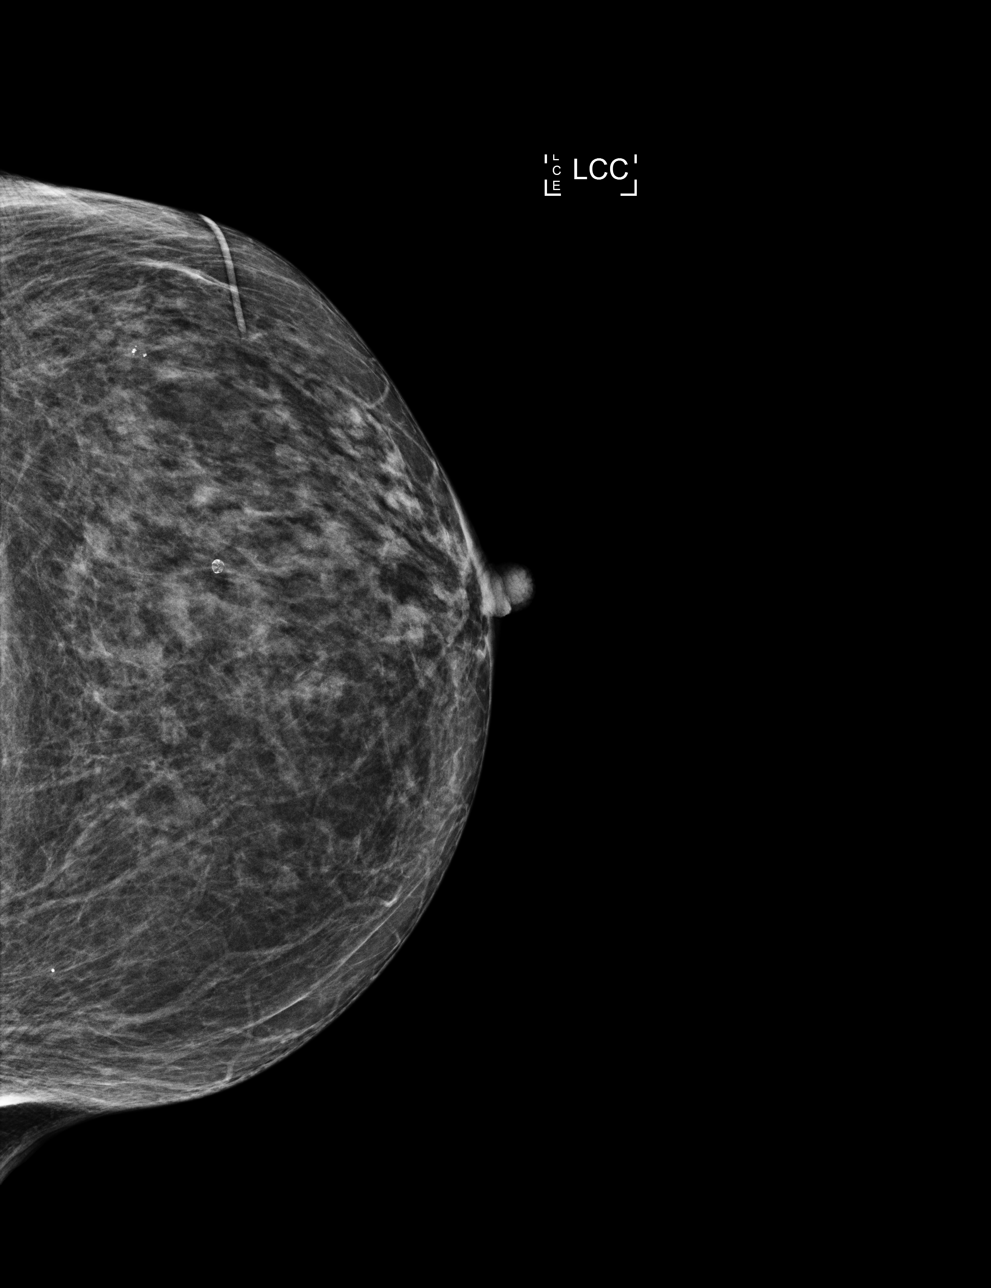

[L MLO]
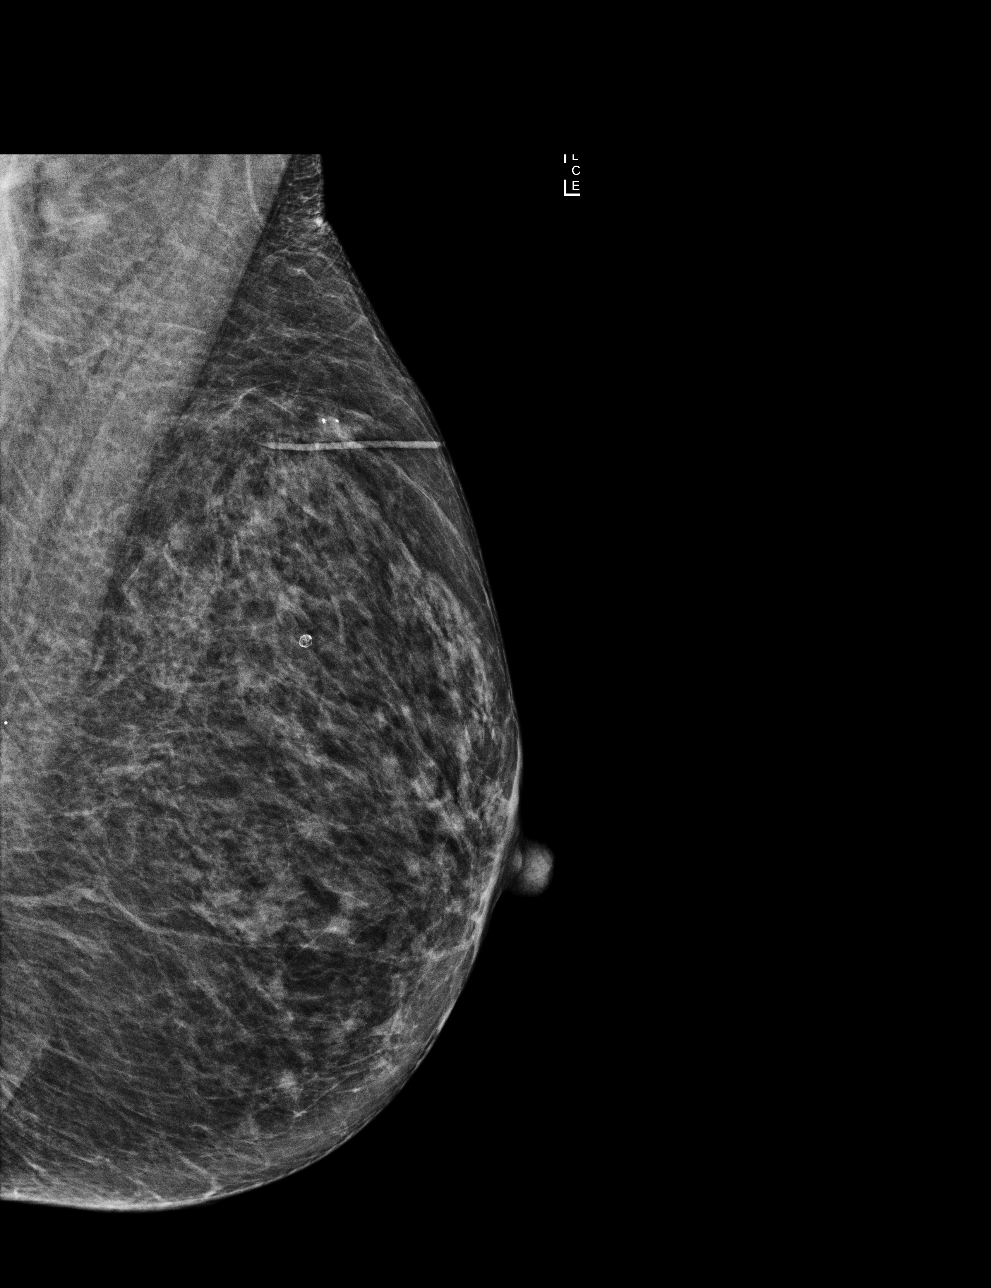

[R CC]
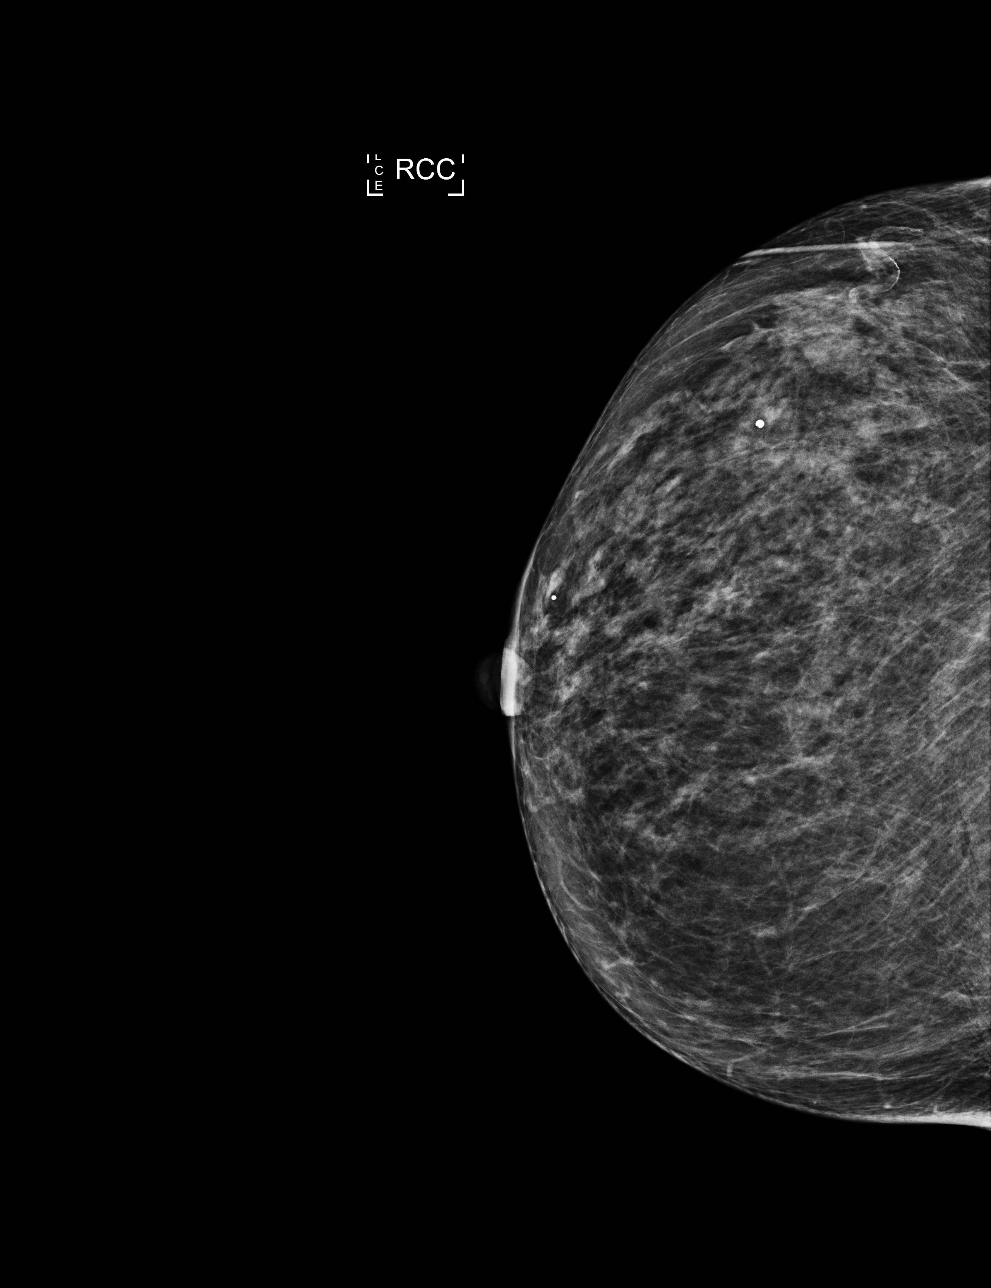

[R MLO]
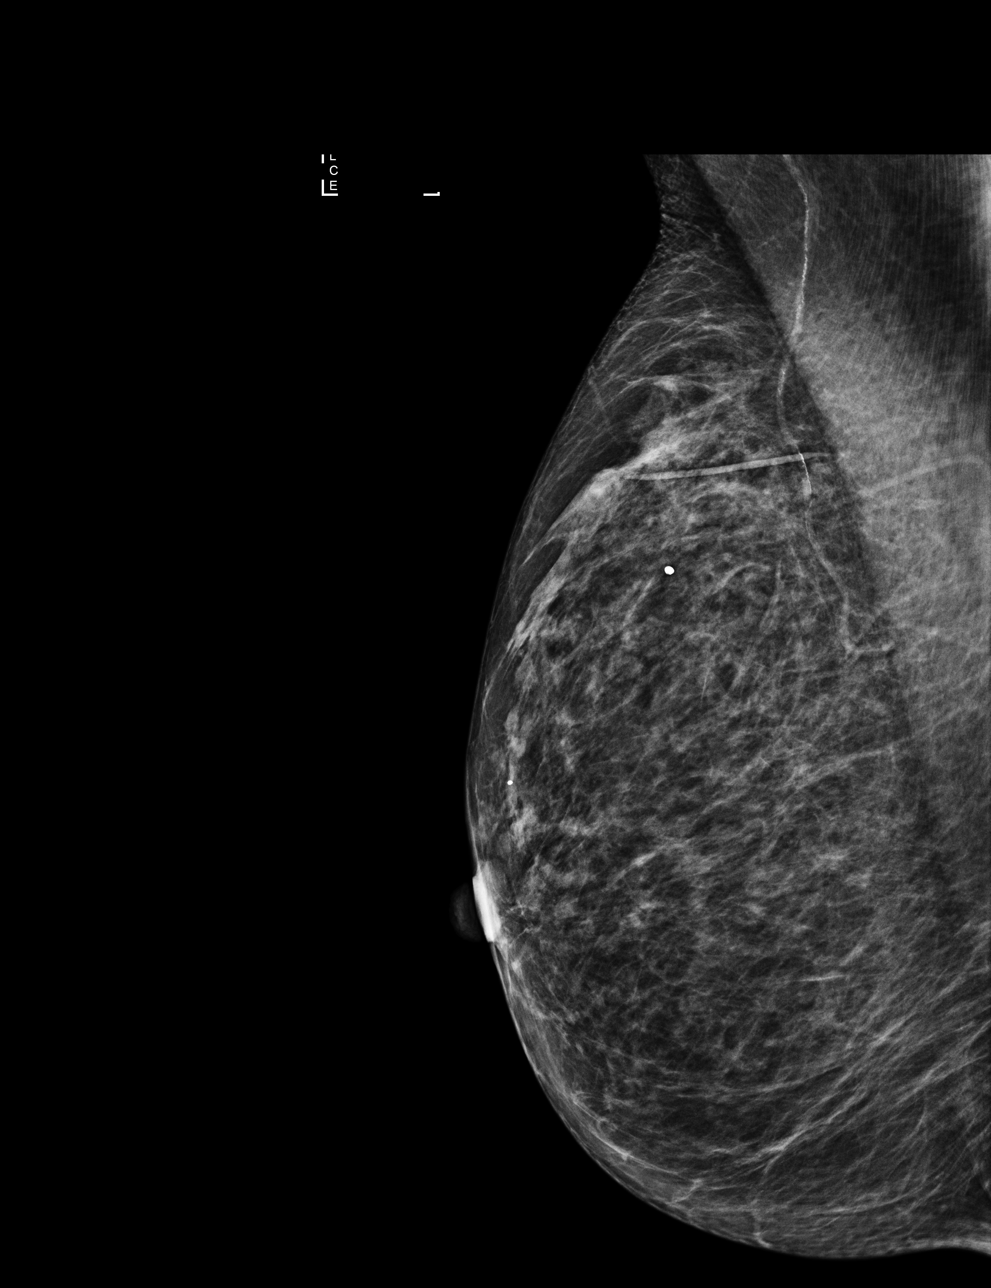

[4 of 4 positions shown; findings below may reference images not displayed]

ACR Breast Density Category b: There are scattered areas of
fibroglandular density.
FINDINGS: There are no findings suspicious for malignancy. The images were
evaluated with computer-aided detection.
IMPRESSION: No mammographic evidence of malignancy. A result letter of this
screening mammogram will be mailed directly to the patient.

RECOMMENDATION:
Screening mammogram in one year. (Code:[id number])

BI-RADS CATEGORY  1: Negative.

## 2022-08-15 ENCOUNTER — Other Ambulatory Visit: Payer: Self-pay

## 2022-08-15 ENCOUNTER — Emergency Department (HOSPITAL_BASED_OUTPATIENT_CLINIC_OR_DEPARTMENT_OTHER): Payer: Medicare Other

## 2022-08-15 ENCOUNTER — Emergency Department (HOSPITAL_BASED_OUTPATIENT_CLINIC_OR_DEPARTMENT_OTHER)
Admission: EM | Admit: 2022-08-15 | Discharge: 2022-08-15 | Disposition: A | Discharge status: Home / Self Care | Payer: Medicare Other | Attending: Emergency Medicine | Admitting: Emergency Medicine

## 2022-08-15 ENCOUNTER — Encounter (HOSPITAL_BASED_OUTPATIENT_CLINIC_OR_DEPARTMENT_OTHER): Payer: Self-pay | Admitting: Emergency Medicine

## 2022-08-15 DIAGNOSIS — F17210 Nicotine dependence, cigarettes, uncomplicated: Secondary | ICD-10-CM | POA: Diagnosis not present

## 2022-08-15 DIAGNOSIS — D72829 Elevated white blood cell count, unspecified: Secondary | ICD-10-CM | POA: Diagnosis not present

## 2022-08-15 DIAGNOSIS — Z23 Encounter for immunization: Secondary | ICD-10-CM | POA: Diagnosis not present

## 2022-08-15 DIAGNOSIS — S41112A Laceration without foreign body of left upper arm, initial encounter: Secondary | ICD-10-CM | POA: Diagnosis not present

## 2022-08-15 DIAGNOSIS — W010XXA Fall on same level from slipping, tripping and stumbling without subsequent striking against object, initial encounter: Secondary | ICD-10-CM | POA: Insufficient documentation

## 2022-08-15 DIAGNOSIS — S4992XA Unspecified injury of left shoulder and upper arm, initial encounter: Secondary | ICD-10-CM | POA: Diagnosis present

## 2022-08-15 DIAGNOSIS — I10 Essential (primary) hypertension: Secondary | ICD-10-CM | POA: Diagnosis not present

## 2022-08-15 DIAGNOSIS — W19XXXA Unspecified fall, initial encounter: Secondary | ICD-10-CM

## 2022-08-15 DIAGNOSIS — Z79899 Other long term (current) drug therapy: Secondary | ICD-10-CM | POA: Insufficient documentation

## 2022-08-15 DIAGNOSIS — I499 Cardiac arrhythmia, unspecified: Secondary | ICD-10-CM | POA: Diagnosis not present

## 2022-08-15 LAB — CBC WITH DIFFERENTIAL/PLATELET
Abs Immature Granulocytes: 0.07 10*3/uL (ref 0.00–0.07)
Basophils Absolute: 0 10*3/uL (ref 0.0–0.1)
Basophils Relative: 0 %
Eosinophils Absolute: 0 10*3/uL (ref 0.0–0.5)
Eosinophils Relative: 0 %
HCT: 38.6 % (ref 36.0–46.0)
Hemoglobin: 12.8 g/dL (ref 12.0–15.0)
Immature Granulocytes: 1 %
Lymphocytes Relative: 10 %
Lymphs Abs: 1.2 10*3/uL (ref 0.7–4.0)
MCH: 30.9 pg (ref 26.0–34.0)
MCHC: 33.2 g/dL (ref 30.0–36.0)
MCV: 93.2 fL (ref 80.0–100.0)
Monocytes Absolute: 0.8 10*3/uL (ref 0.1–1.0)
Monocytes Relative: 7 %
Neutro Abs: 9.6 10*3/uL — ABNORMAL HIGH (ref 1.7–7.7)
Neutrophils Relative %: 82 %
Platelets: 237 10*3/uL (ref 150–400)
RBC: 4.14 MIL/uL (ref 3.87–5.11)
RDW: 15.5 % (ref 11.5–15.5)
WBC: 11.7 10*3/uL — ABNORMAL HIGH (ref 4.0–10.5)
nRBC: 0 % (ref 0.0–0.2)

## 2022-08-15 LAB — TSH: TSH: 1.284 u[IU]/mL (ref 0.350–4.500)

## 2022-08-15 LAB — BASIC METABOLIC PANEL
Anion gap: 10 (ref 5–15)
BUN: 22 mg/dL (ref 8–23)
CO2: 23 mmol/L (ref 22–32)
Calcium: 9.7 mg/dL (ref 8.9–10.3)
Chloride: 106 mmol/L (ref 98–111)
Creatinine, Ser: 0.88 mg/dL (ref 0.44–1.00)
GFR, Estimated: >60 mL/min (ref >60)
Glucose, Bld: 142 mg/dL — ABNORMAL HIGH (ref 70–99)
Potassium: 3.6 mmol/L (ref 3.5–5.1)
Sodium: 139 mmol/L (ref 135–145)

## 2022-08-15 LAB — CBG MONITORING, ED: Glucose-Capillary: 137 mg/dL — ABNORMAL HIGH (ref 70–99)

## 2022-08-15 LAB — T4, FREE: Free T4: 0.89 ng/dL (ref 0.61–1.12)

## 2022-08-15 LAB — MAGNESIUM: Magnesium: 1.7 mg/dL (ref 1.7–2.4)

## 2022-08-15 MED ORDER — BACITRACIN ZINC 500 UNIT/GM EX OINT
TOPICAL_OINTMENT | Freq: Two times a day (BID) | CUTANEOUS | Status: DC
  Administered 2022-08-15: 2 via TOPICAL

## 2022-08-15 MED ORDER — TETANUS-DIPHTH-ACELL PERTUSSIS 5-2.5-18.5 LF-MCG/0.5 IM SUSY
0.5000 mL | PREFILLED_SYRINGE | Freq: Once | INTRAMUSCULAR | Status: AC
  Administered 2022-08-15: 0.5 mL via INTRAMUSCULAR
  Filled 2022-08-15: qty 0.5

## 2022-08-15 MED ORDER — LIDOCAINE-EPINEPHRINE-TETRACAINE (LET) TOPICAL GEL
3.0000 mL | Freq: Once | TOPICAL | Status: AC
  Administered 2022-08-15: 3 mL via TOPICAL
  Filled 2022-08-15: qty 3

## 2022-08-15 NOTE — ED Notes (Signed)
Discharge paperwork reviewed entirely with patient, including follow up care. Pain was under control. No prescriptions were called in, but all questions were addressed.  Pt verbalized understanding as well as all parties involved. No questions or concerns voiced at the time of discharge. No acute distress noted.   Pt ambulated out to PVA without incident or assistance.  

## 2022-08-15 NOTE — ED Notes (Signed)
Pt called out complaining of an acute increase in anxiety after LET gel was applied. Pt has history of palpitations and weakness/uneasiness when her sugar drops from not eating. Provider notified and orders placed and fulfilled.   Pt advised it feels similar to when her sugar drops. Meal given as precaution, crackers, gingerale, nutrigrain bar. 12L obtained.

## 2022-08-15 NOTE — ED Triage Notes (Addendum)
Pt got tangled in her patio umbrella last night, tripped and fell onto her left shoulder/arm.  Pt able to move arm.  Pt has multiple skin tears/lacerations to left arm.  Bleeding controlled

## 2022-08-15 NOTE — Discharge Instructions (Signed)
As discussed, from fall perspective workup today overall reassuring.  X-ray without abnormality.  Recommend antibiotic ointment as well as changes in dressing daily over area that was not glued.  Regarding arrhythmia appreciated on the emergency department, recommend follow-up with cardiology in outpatient setting; I have sent a referral to set up an appointment.  Please not hesitate to return to emergency department for worrisome signs and symptoms we discussed become apparent.

## 2022-08-15 NOTE — ED Provider Notes (Signed)
[facility] Provider Note   CSN: [id number] Arrival date & time: 08/15/22  1015     History  Chief Complaint  Patient presents with   Shoulder Injury   Laceration    Mary Fitzpatrick is a 82 y.o. female.   Shoulder Injury  Laceration   82 year old female presents emergency department with complaints of right shoulder/arm pain.  Patient states that she was trying to set up an umbrella around 7 to 8 PM last night when she tripped subsequently falling to her left and scraping her left arm on the brick wall and then subsequently falling to her bottom.  Denies trauma to head, loss consciousness, blood thinner use.  States that she has been trying to bandage skin tear at home but due to persistent oozing of blood, prompted visit to the emergency department.  Denies chest pain, shortness of breath, Donnell pain, nausea, vomiting.  Patient denies any weakness or loss of range of motion of bilateral upper or lower extremities.  Past medical history significant for hypertension, hyperlipidemia, hyperparathyroidism  Home Medications Prior to Admission medications   Medication Sig Start Date End Date Taking? Authorizing Provider  amLODipine (NORVASC) 2.5 MG tablet Take 1 tablet (2.5 mg total) by mouth daily. 07/08/22   Olive Bass, FNP  atorvastatin (LIPITOR) 20 MG tablet Take 1 tablet (20 mg total) by mouth every morning. 07/08/22   Olive Bass, FNP  cholecalciferol (VITAMIN D3) 25 MCG (1000 UNIT) tablet Take 1,000 Units by mouth daily.    [provider]      Allergies    Hctz [hydrochlorothiazide]    Review of Systems   Review of Systems  All other systems reviewed and are negative.   Physical Exam Updated Vital Signs BP (!) 147/74   Pulse 98   Temp 98.1 F (36.7 C) (Oral)   Resp (!) 23   Ht 5\' 7"  (1.702 m)   Wt 53.1 kg   SpO2 98%   BMI 18.32 kg/m  Physical Exam Vitals and nursing note reviewed.   Constitutional:      General: She is not in acute distress.    Appearance: She is well-developed.  HENT:     Head: Normocephalic and atraumatic.  Eyes:     Conjunctiva/sclera: Conjunctivae normal.  Cardiovascular:     Rate and Rhythm: Normal rate and regular rhythm.     Heart sounds: No murmur heard. Pulmonary:     Effort: Pulmonary effort is normal. No respiratory distress.     Breath sounds: Normal breath sounds.  Abdominal:     Palpations: Abdomen is soft.     Tenderness: There is no abdominal tenderness.  Musculoskeletal:        General: No swelling.     Cervical back: Neck supple.     Comments: No midline tenderness of cervical, thoracic, lumbar spine lower step-off or Forei noted.  No anterior chest wall tenderness to palpation.  Patient with large skin avulsion noted on the dorsal lateral aspect of mid left arm with oozing of blood appreciated.  No active foreign body.  Patient with additional superficial skin tear noted on left shoulder as well as thenar eminence primarily of right hand.  Patient has full range of motion of bilateral upper and lower extremities at shoulders, elbows, wrists, digits, hips, knees, ankles, digits.  Radial and pedal pulses 2+ bilaterally.  Skin:    General: Skin is warm and dry.     Capillary Refill: Capillary  refill takes less than 2 seconds.  Neurological:     Mental Status: She is alert.  Psychiatric:        Mood and Affect: Mood normal.     ED Results / Procedures / Treatments   Labs (all labs ordered are listed, but only abnormal results are displayed) Labs Reviewed  CBC WITH DIFFERENTIAL/PLATELET - Abnormal; Notable for the following components:      Result Value   WBC 11.7 (*)    Neutro Abs 9.6 (*)    All other components within normal limits  BASIC METABOLIC PANEL - Abnormal; Notable for the following components:   Glucose, Bld 142 (*)    All other components within normal limits  CBG MONITORING, ED - Abnormal; Notable for the  following components:   Glucose-Capillary 137 (*)    All other components within normal limits  MAGNESIUM  TSH  T4, FREE    EKG EKG Interpretation  Date/Time:  Friday Aug 15 2022 14:16:45 EDT Ventricular Rate:  80 PR Interval:  147 QRS Duration: 91 QT Interval:  379 QTC Calculation: 438 R Axis:   64 Text Interpretation: Sinus rhythm RSR' in V1 or V2, probably normal variant Confirmed by Gilda Crease [phone]) on 08/16/2022 1:47:29 PM  Radiology DG Humerus Left  Result Date: 08/15/2022 CLINICAL DATA:  Fall, injury.  Prior humeral ORIF 1.5 years ago. EXAM: LEFT HUMERUS - 2+ VIEW COMPARISON:  None Available. FINDINGS: There is no evidence of fracture or other focal bone lesions. Status post ORIF for prior left humeral. Soft tissue swelling about the posterior aspect of the distal humerus. IMPRESSION: No acute fracture or dislocation. Status post ORIF for prior left humeral fracture. Electronically Signed   By: Larose Hires D.O.   On: 08/15/2022 11:56    Procedures .Marland KitchenLaceration Repair  Date/Time: 08/15/2022 7:14 PM  Performed by: Peter Garter, PA Authorized by: Peter Garter, PA   Consent:    Consent obtained:  Verbal   Consent given by:  Patient   Risks, benefits, and alternatives were discussed: yes     Risks discussed:  Infection, need for additional repair, nerve damage, poor wound healing, poor cosmetic result, pain, retained foreign body, vascular damage and tendon damage   Alternatives discussed:  No treatment, delayed treatment, referral and observation Universal protocol:    Procedure explained and questions answered to patient or proxy's satisfaction: yes     Patient identity confirmed:  Verbally with patient Anesthesia:    Anesthesia method:  Topical application   Topical anesthetic:  LET Laceration details:    Location:  Shoulder/arm   Shoulder/arm location:  L upper arm   Length (cm):  18.5 Pre-procedure details:    Preparation:  Patient was  prepped and draped in usual sterile fashion and imaging obtained to evaluate for foreign bodies Exploration:    Limited defect created (wound extended): no     Hemostasis achieved with:  Direct pressure   Imaging obtained: x-ray     Imaging outcome: foreign body not noted     Wound exploration: wound explored through full range of motion and entire depth of wound visualized     Contaminated: no   Treatment:    Area cleansed with:  Saline   Amount of cleaning:  Standard   Irrigation solution:  Sterile water   Irrigation volume:  500cc   Irrigation method:  Syringe   Visualized foreign bodies/material removed: no     Debridement:  None   Undermining:  None  Scar revision: no   Skin repair:    Repair method:  Tissue adhesive Approximation:    Approximation:  Close Repair type:    Repair type:  Simple Post-procedure details:    Dressing:  Bulky dressing and antibiotic ointment   Procedure completion:  Tolerated well, no immediate complications .Marland KitchenLaceration Repair  Date/Time: 08/15/2022 7:15 PM  Performed by: Peter Garter, PA Authorized by: Peter Garter, PA   Consent:    Consent obtained:  Verbal   Consent given by:  Patient   Risks, benefits, and alternatives were discussed: yes     Risks discussed:  Need for additional repair, nerve damage, infection, pain, poor cosmetic result, poor wound healing, vascular damage, tendon damage and retained foreign body   Alternatives discussed:  No treatment, delayed treatment, observation and referral Universal protocol:    Procedure explained and questions answered to patient or proxy's satisfaction: yes     Patient identity confirmed:  Verbally with patient Anesthesia:    Anesthesia method:  Topical application   Topical anesthetic:  LET Laceration details:    Location:  Shoulder/arm   Shoulder/arm location:  L shoulder   Length (cm):  3 Pre-procedure details:    Preparation:  Patient was prepped and draped in usual  sterile fashion Exploration:    Limited defect created (wound extended): no     Hemostasis achieved with:  Direct pressure   Imaging outcome: foreign body not noted     Wound exploration: wound explored through full range of motion and entire depth of wound visualized     Contaminated: no   Treatment:    Area cleansed with:  Saline   Amount of cleaning:  Standard   Irrigation solution:  Sterile saline   Irrigation volume:  250cc   Irrigation method:  Syringe   Visualized foreign bodies/material removed: no     Debridement:  None   Undermining:  None   Scar revision: no   Skin repair:    Repair method:  Tissue adhesive Approximation:    Approximation:  Close Repair type:    Repair type:  Simple Post-procedure details:    Dressing:  Open (no dressing)   Procedure completion:  Tolerated well, no immediate complications .Marland KitchenLaceration Repair  Date/Time: 08/15/2022 7:16 PM  Performed by: Peter Garter, PA Authorized by: Peter Garter, PA   Consent:    Consent obtained:  Verbal   Consent given by:  Patient   Risks, benefits, and alternatives were discussed: yes     Risks discussed:  Infection, need for additional repair, poor wound healing, poor cosmetic result, pain, retained foreign body, tendon damage, vascular damage and nerve damage   Alternatives discussed:  Referral, observation, no treatment and delayed treatment Universal protocol:    Procedure explained and questions answered to patient or proxy's satisfaction: yes     Patient identity confirmed:  Verbally with patient Anesthesia:    Anesthesia method:  Topical application   Topical anesthetic:  LET Laceration details:    Location:  Hand   Hand location:  R palm   Length (cm):  1.7 Pre-procedure details:    Preparation:  Patient was prepped and draped in usual sterile fashion Exploration:    Limited defect created (wound extended): no     Hemostasis achieved with:  Direct pressure   Imaging outcome:  foreign body not noted     Wound exploration: wound explored through full range of motion and entire depth of wound visualized  Contaminated: no   Treatment:    Area cleansed with:  Saline   Amount of cleaning:  Standard   Irrigation solution:  Sterile saline   Irrigation volume:  100cc   Irrigation method:  Syringe   Visualized foreign bodies/material removed: no     Debridement:  None   Undermining:  None   Scar revision: no   Skin repair:    Repair method:  Tissue adhesive Approximation:    Approximation:  Close Repair type:    Repair type:  Simple Post-procedure details:    Dressing:  Open (no dressing)   Procedure completion:  Tolerated well, no immediate complications     Medications Ordered in ED Medications  Tdap (BOOSTRIX) injection 0.5 mL (0.5 mLs Intramuscular Given 08/15/22 1128)  lidocaine-EPINEPHrine-tetracaine (LET) topical gel (3 mLs Topical Given 08/15/22 1134)    ED Course/ Medical Decision Making/ A&P Clinical Course as of 08/16/22 1753  Fri Aug 15, 2022  1250 Just prior to repairing skin tear, patient with elevated heart rate 100s or 1 teens with evidence of **on EKG.  Laboratory studies ordered [CR]    Clinical Course User Index [CR] Peter Garter, PA                             Medical Decision Making Amount and/or Complexity of Data Reviewed Labs: ordered. Radiology: ordered.  Risk OTC drugs. Prescription drug management.   This patient presents to the ED for concern of fall, this involves an extensive number of treatment options, and is a complaint that carries with it a high risk of complications and morbidity.  The differential diagnosis includes fracture, dislocation, strain/sprain, laceration, foreign body retainment, skin tear, ligamentous/tendinous injury, neurovascular compromise   Co morbidities that complicate the patient evaluation  See HPI   Additional history obtained:  Additional history obtained from EMR External  records from outside source obtained and reviewed including hospital records   Lab Tests:  Mild leukocytosis of 1.7.  No evidence of anemia.  Platelets within normal range.  No electrolyte abnormalities.  No renal dysfunction.  CBG 137.  Magnesium within normal limits.  TSH and T4 of 1.2.89 respectively   Imaging Studies ordered:  I ordered imaging studies including x-ray left humerus I independently visualized and interpreted imaging which showed no acute fracture or dislocation I agree with the radiologist interpretation   Cardiac Monitoring: / EKG:  The patient was maintained on a cardiac monitor.  I personally viewed and interpreted the cardiac monitored which showed an underlying rhythm of: Sinus rhythm   Consultations Obtained:  N/a   Problem List / ED Course / Critical interventions / Medication management  Fall/skin tear I ordered medication including Tdap, let   Reevaluation of the patient after these medicines showed that the patient improved I have reviewed the patients home medicines and have made adjustments as needed   Social Determinants of Health:  Some cigarette use.  Denies illicit drug use.   Test / Admission - Considered:  Fall/skin tear  vitals signs significant for hypertension with blood pressure 151/91. Otherwise within normal range and stable throughout visit. Imaging studies significant for: See above 82 year old female presents emergency department with complaints of fall.  Patient without evidence of acute fracture or dislocation but with evidence of 3 distinct skin tears repaired in manner as depicted above.  While waiting for repair, patient began to have palpitations with evidence of possible atrial fibrillation on cardiac monitoring  with rate in the 1 teens.  Episode was brief lasting about a few minutes before spontaneous resolution.  Laboratory study were then performed which were relatively unremarkable as depicted above.  Patient  recommended follow-up with cardiology given brief episode of arrhythmia.  Treatment plan discussed at length with patient and she acknowledged understanding was agreeable to said plan.  Patient overall well-appearing, febrile no acute distress.   Worrisome signs and symptoms were discussed with the patient, and the patient acknowledged understanding to return to the ED if noticed. Patient was stable upon discharge.          Final Clinical Impression(s) / ED Diagnoses Final diagnoses:  Fall, initial encounter  Skin tear of left upper arm without complication, initial encounter  Cardiac arrhythmia, unspecified cardiac arrhythmia type    Rx / DC Orders ED Discharge Orders          Ordered    Ambulatory referral to Cardiology        08/15/22 1458              Peter Garter, [place] 08/16/22 1754    Loetta Rough, MD 08/25/22 [phone]

## 2022-10-06 ENCOUNTER — Ambulatory Visit (HOSPITAL_BASED_OUTPATIENT_CLINIC_OR_DEPARTMENT_OTHER): Admission: RE | Admit: 2022-10-06 | Payer: Medicare Other | Source: Ambulatory Visit

## 2022-10-06 ENCOUNTER — Ambulatory Visit (HOSPITAL_BASED_OUTPATIENT_CLINIC_OR_DEPARTMENT_OTHER)
Admission: RE | Admit: 2022-10-06 | Discharge: 2022-10-06 | Disposition: A | Discharge status: Home / Self Care | Payer: Medicare Other | Source: Ambulatory Visit | Attending: Family | Admitting: Family

## 2022-10-06 ENCOUNTER — Encounter (HOSPITAL_BASED_OUTPATIENT_CLINIC_OR_DEPARTMENT_OTHER): Payer: Self-pay

## 2022-10-06 DIAGNOSIS — Z1231 Encounter for screening mammogram for malignant neoplasm of breast: Secondary | ICD-10-CM | POA: Diagnosis not present

## 2022-10-06 DIAGNOSIS — M810 Age-related osteoporosis without current pathological fracture: Secondary | ICD-10-CM

## 2022-10-09 ENCOUNTER — Telehealth: Payer: Self-pay | Admitting: *Deleted

## 2022-10-09 NOTE — Telephone Encounter (Signed)
Please run Prolia benefit.  Pt has osteoporosis.

## 2022-10-10 ENCOUNTER — Telehealth: Payer: Self-pay

## 2022-10-10 NOTE — Telephone Encounter (Signed)
Created new encounter for Prolia BIV. Will route encounter back once benefit verification is complete.  

## 2022-10-10 NOTE — Telephone Encounter (Signed)
Pt has an appt on 10/16/22 to discuss to see if she would like to get it.

## 2022-10-10 NOTE — Telephone Encounter (Signed)
Pt ready for scheduling for PROLIA on or after : 10/10/22  Out-of-pocket cost due at time of visit: $0  Primary: MEDICARE Prolia co-insurance: 0% Admin fee co-insurance: 0%  Secondary: [employer] Prolia co-insurance:  Admin fee co-insurance:   Medical Benefit Details: Date Benefits were checked: 10/10/22 Deductible: $240 MET OF $240 REQUIRED/ Coinsurance: 0%/ Admin Fee: 0%  Prior Auth: N/A PA# Expiration Date:   # of doses approved:  Pharmacy benefit: Copay $--- If patient wants fill through the pharmacy benefit please send prescription to:  --- , and include estimated need by date in rx notes. Pharmacy will ship medication directly to the office.  Patient NOT eligible for Prolia Copay Card. Copay Card can make patient's cost as little as $25. Link to apply: [email or web address]://[email or web address]  ** This summary of benefits is an estimation of the patient's out-of-pocket cost. Exact cost may very based on individual plan coverage.

## 2022-10-10 NOTE — Telephone Encounter (Signed)
Prolia VOB initiated via [email or web address]  New to therapy

## 2022-10-16 ENCOUNTER — Encounter: Payer: Self-pay | Admitting: Family

## 2022-10-16 ENCOUNTER — Ambulatory Visit (INDEPENDENT_AMBULATORY_CARE_PROVIDER_SITE_OTHER): Payer: Medicare Other | Admitting: Family

## 2022-10-16 VITALS — BP 138/86 | HR 65 | Ht 67.0 in | Wt 114.8 lb

## 2022-10-16 DIAGNOSIS — M810 Age-related osteoporosis without current pathological fracture: Secondary | ICD-10-CM

## 2022-10-16 MED ORDER — ALENDRONATE SODIUM 70 MG PO TABS: 70.0000 mg | ORAL_TABLET | ORAL | 11 refills | Status: DC

## 2022-10-16 NOTE — Progress Notes (Signed)
Mary Fitzpatrick is a 82 y.o. female with the following history as recorded in Epic[employer]:  Patient Active Problem List   Diagnosis Date Noted   S/P ORIF (open reduction internal fixation) fracture 01/15/2021   Hyperkalemia 12/20/2020   Primary hyperparathyroidism (HCC) 12/20/2020   Need for immunization against influenza 12/20/2020   Hypertension 07/20/2020   Hyperlipidemia 07/20/2020   Osteoporosis 07/20/2020   Tobacco abuse 07/20/2020    Current Outpatient Medications  Medication Sig Dispense Refill   alendronate (FOSAMAX) 70 MG tablet Take 1 tablet (70 mg total) by mouth every 7 (seven) days. Take with a full glass of water on an empty stomach. 4 tablet 11   amLODipine (NORVASC) 2.5 MG tablet Take 1 tablet (2.5 mg total) by mouth daily. 90 tablet 3   atorvastatin (LIPITOR) 20 MG tablet Take 1 tablet (20 mg total) by mouth every morning. 90 tablet 3   cholecalciferol (VITAMIN D3) 25 MCG (1000 UNIT) tablet Take 1,000 Units by mouth daily.     No current facility-administered medications for this visit.    Allergies: Hctz [hydrochlorothiazide]  Past Medical History:  Diagnosis Date   Arthritis    Hyperlipidemia    Hypertension    Parathyroid abnormality (HCC)    nodule on one of the glands    Past Surgical History:  Procedure Laterality Date   BREAST EXCISIONAL BIOPSY Left    BREAST EXCISIONAL BIOPSY Right    GASTRECTOMY     Due to a ruptured bleeding ulcer   LAPAROTOMY     exploratory kink in bowel due to water sking   ORIF HUMERUS FRACTURE Left 01/15/2021   Procedure: OPEN REDUCTION INTERNAL FIXATION (ORIF) PROXIMAL HUMERUS FRACTURE;  Surgeon: Francena Hanly, MD;  Location: [facility];  Service: Orthopedics;  Laterality: Left;   TONSILLECTOMY      No family history on file.  Social History   Tobacco Use   Smoking status: Some Days    Types: Cigarettes   Smokeless tobacco: Never  Substance Use Topics   Alcohol use: Yes    Alcohol/week: 2.0 standard drinks of alcohol     Types: 2 Glasses of wine per week    Comment: most nights    Subjective:   Review treatment options for osteoporosis- worsening changes noted on recent DEXA; has had a fracture in the past year; Took Fosamax in the past and stopped around 2020; was given information on Prolia but not comfortable with that medication; would be willing to re-start Fosamax;   Has had history of chronically elevated calcium but normal PTH- ? Regarding diagnosis of hyperparathyroidism; calcium has normalized since she was taking off hydrochlorothiazide and change to Amlodipine; most recent calcium was 9 when checked in May 2024;   Objective:  Vitals:   10/16/22 1039  BP: 138/86  Pulse: 65  SpO2: 100%  Weight: 114 lb 12.8 oz (52.1 kg)  Height: 5\' 7"  (1.702 m)    General: Well developed, well nourished, in no acute distress  Skin : Warm and dry.  Head: Normocephalic and atraumatic  Lungs: Respirations unlabored;  Neurologic: Alert and oriented; speech intact; face symmetrical; moves all extremities well; CNII-XII intact without focal deficit   Assessment:  1. Osteoporosis without current pathological fracture, unspecified osteoporosis type     Plan:  Due to marked worsening of bone density and history of complex fracture, initially recommended Prolia; patient defers that medication at this time but is willing to re-start Fosamax with plan to re-check DEXA in 1 year; dietary changes  discussed and will have her try Caltrate D once daily for now with plan to re-check calcium and Pth at follow up visit in October;   There is confusion regarding diagnosis of hyperparathyroidism as her PTH has always been normal and calcium was normal at last check but patient aware that will need to closely monitor and she is in agreement;   Time spent 30+ minutes  Return for Keep your follow up in October.  No orders of the defined types were placed in this encounter.   Requested Prescriptions   Signed Prescriptions Disp  Refills   alendronate (FOSAMAX) 70 MG tablet 4 tablet 11    Sig: Take 1 tablet (70 mg total) by mouth every 7 (seven) days. Take with a full glass of water on an empty stomach.

## 2022-10-16 NOTE — Patient Instructions (Signed)
Please try Caltrate D once a day as discussed; we will plan to re-check the calcium at the OV in October; it was normal when checked in May;

## 2022-10-17 NOTE — Telephone Encounter (Signed)
Pt started fosamax instead.

## 2022-10-28 ENCOUNTER — Ambulatory Visit (HOSPITAL_BASED_OUTPATIENT_CLINIC_OR_DEPARTMENT_OTHER): Payer: Medicare Other | Admitting: Cardiology

## 2022-11-05 NOTE — H&P (Signed)
 [facility]  Electrophysiology  Admission H&P      Patient Identification:  Patient: Kathryn [name]  MRN: [id number]  DOB: August 22, 1940   Hospital: [facility]    Outpatient Cardiologist: Dr. Phil Dopp  Primary care physician: Sena Slate, MD     Admit Date: 11/06/22   Chief complaint: ICD battery depletion    Problem List:    NICMO  S/p BiVICD implanted in 2010  NST 08/25/19 no ischemia   Echo 02/29/2020 EF 34%  Echo 10/03/20 EF 45%, LVH, mod TR, PAP 44 mmHg   Chronic HFrEF  Hypertension  Dyslipidemia       Assessment & Plan:  Proceed with generator change of existing Abbott CRT-D       HPI:  Kathryn [name] is a 82 y.o. female with history of NICMO s/p CRT-D. Device reached ERI 09/13/22. Presents for generator change.    Past Medical History:   The following medical history was reviewed.    Medical History:  Past Medical History:   Diagnosis Date   . Arthropathy, unspecified, site unspecified    . Blood transfusion    . Cardiac arrhythmia    . 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 Coronary artery disease    . Gastric reflux    . Gout     feet & toes   . History of cancer chemotherapy    . History of left mastectomy    . History of radiation therapy    . HX: breast cancer 03/17/1990   . Hypertension    . ICD (implantable cardiac defibrillator) in place     [facility] Jude Med BI-V   . Other malignant neoplasm without specification of site    . Type 2 diabetes mellitus [facility])         Surgical History:  Past Surgical History:   Procedure Laterality Date   . HYSTERECTOMY     . MASTECTOMY Left         Social History:   Social History     Socioeconomic History   . Marital status: Married     Spouse name: Not on file   . Number of children: Not on file   . Years of education: Not on file   . Highest education level: Not on file   Occupational History   . Not on file   Tobacco Use   . Smoking status: Former     Current packs/day: 0.00      Types: Cigarettes     Quit date: 1969     Years since quitting: 55.6   . Smokeless tobacco: Never   Vaping Use   . Vaping status: Never Used   Substance and Sexual Activity   . Alcohol use: No   . Drug use: No   . Sexual activity: Not on file   Other Topics Concern   . Caffeine Concerns Greater Than 3 Cups Per Day No     Comment: 1 cup   . Exercise Yes   . Energy Drinks No   . Special Diet No   Social History Narrative   . Not on file     Social Determinants of Health     Financial Resource Strain: Not on file   Food Insecurity: Not on file   Transportation Needs: Not on file   Physical Activity: Not on file   Stress: Not on file   Social Connections: Not on file  Intimate Partner Violence: Not on file   Housing Stability: Not on file       Family History:   Family History   Problem Relation Age of Onset   . Diabetes Mother    . Hypertension Mother    . Stroke Father    . Hypertension Father        Allergies:   Allergies   Allergen Reactions   . Penicillins hives   . Sulfa (Sulfonamide Antibiotics) hives       Home Medications:    Home Medication List - Marked as Reviewed on 11/06/22 1233   Medication Sig   allopurinol (ZYLOPRIM) 100 mg PO TABS Take 1 Tab by Mouth Once a Day.   aspirin EC 81 mg PO TBEC Take 1 Tab by Mouth Once a Day.   atorvastatin (LIPITOR) 20 mg PO TABS Take 1 Tab by Mouth Every Night at Bedtime.   carvediloL (COREG) 12.5 mg PO TABS Take 1 Tab by Mouth 2 Times Daily with Meals.   donepeziL (ARICEPT) 10 mg PO TABS Take 1 Tab by Mouth Every Night at Bedtime.   furosemide (LASIX) 20 mg PO TABS Take 1 Tab by Mouth Once a Day.   metFORMIN ER (GLUCOPHAGE ER) 500 mg PO TB24 24 hour tablet Take 1 Tab by Mouth Daily Before Supper.   mirtazapine (REMERON) 15 mg PO TABS    sacubitriL-valsartan (ENTRESTO) 49-51 mg PO TABS Take 1 Tab by Mouth Twice Daily.   spironolactone (ALDACTONE) 25 mg PO TABS Take 0.5 Tabs by Mouth Once a Day.   traZODone (DESYREL) 50 mg PO TABS Take 1 Tab by Mouth Every Night at Bedtime.          REVIEW OF SYSTEMS:     Constitutional: negative for chills, fever, malaise/fatigue   Skin: negative for itching and rash   ENT: negative for ear discharge, ear pain, headaches, and nose bleeds   Eyes: negative for blurred vision, double vision   Cardiovascular: negative for chest pain, claudication, leg swelling, orthopnea, palpitations, paroxysmal nocturnal dyspnea   Respiratory: negative for cough, hemoptysis, shortness of breath, sputum production or wheezing   Gastointestinal: negative for abdominal pain,melena, nausea and vomiting   Genitourinary: Negative for dysuria, hematuria   Musculoskeletal: negative for back pain   Endo: Not assessed.   Heme: negative for  easy bleeding and easy bruising   neuro Negative for weakness dysarthria   All other systems reviewed and are negative       PHYSICAL EXAM:   BP 143/57   Resp 12   Ht 5\' 5"  (1.651 m)   Wt 65.8 kg (145 lb)   SpO2 100%   BMI 24.13 kg/m     CONSTITUTIONAL:   well developed, nourished, no distress   HENT:  Atraumatic, normocephalic, moist mucous membranes   EYES:  conjunctiva normal   NECK: Negative for JVD or carotid bruits, normal range of motion   CARDIOVASCULAR:  Regular rate and rhythm, normal S1 and S2, no murmur, rub or gallop, PMI non displaced   PULMONARY/CHEST WALL: clear to auscultation bilaterally, normal effort, no wheezes, rales or rhonchi   ABDOMINAL: soft, non tender, non distended, + bowel sounds   GENITOURINARY:  deferred   EXTREMITIES:  moving all extremities, 2+ pedal pulses, no edema, clubbing or cyanosis   NEUROLOGICAL:  alert and oriented   SKIN: warm, dry and intact   PSYCH:  normal affect        LAB STUDIES:    Complete  Blood Count  Lab Results   Component Value Date/Time    WBC 5.1 10/30/2022 11:12 AM    RBC 3.88 10/30/2022 11:12 AM    HEMOGLOBIN 12.0 10/30/2022 11:12 AM    HCT 36.1 10/30/2022 11:12 AM    MCV 93 10/30/2022 11:12 AM    MCH 31 10/30/2022 11:12 AM    MCHC 33 10/30/2022 11:12 AM    RDW 14.0 10/30/2022  11:12 AM    PLATELET 320 10/30/2022 11:12 AM    MPV 8.8 (L) 10/30/2022 11:12 AM       Basic Metabolic Panel  Lab Results   Component Value Date    NA 135 10/30/2022    POTASSIUM 4.6 10/30/2022    CHLORIDE 99 10/30/2022    CO2 28 10/30/2022    BUN 13 10/30/2022    CREAT 1.6 (H) 10/30/2022    GLUCOSE 119 (H) 10/30/2022    CALCIUM 9.7 10/30/2022       Coagulation:  Lab Results   Component Value Date    PT 10.2 10/30/2022    PT 11.9 04/15/2006    INR 0.95 10/30/2022    INR 1.00 04/15/2006           CARDIAC DIAGNOSTICS:    EKG independently reviewed: SR with AV pacing      Echocardiogram  Results for orders placed or performed in visit on 10/03/20   Echo Cardiogram Complete   Result Value Ref Range    EF Echo 45     Narrative    CONCLUSIONS    * Left ventricular systolic function is mildly reduced with an ejection  fraction of 45% visually (43 % by Simpson's biplane).    * Global hypokinesis of the left ventricle.    * Left ventricular chamber size is normal.    * There is asymmetrical left ventricular hypertrophy with a mildly thickened  septal wall.    * Left ventricular diastolic function: indeterminate.    * Right ventricular systolic function is normal with TAPSE measuring 1.80  cm.    * Right ventricular chamber dimension is normal.    * Right atrial chamber size is enlarged.    * Mild pulmonary hypertension, estimated pulmonary arterial systolic  pressure is 44 mmHg.    * There is moderate tricuspid valve regurgitation.    Comparison    * Compared to prior study from 02/29/2020.    * EF was 34% with mild LVH. Normal RV size and function. Mild to moderate  TR. RVSP was 32 mmHg.      Patient Info  Name:     Kathryn [name]  Age:     82 years  DOB:     November 30, 1940  Gender:     Female  MRN:     [id number]  Ht:     62 in  Wt:     138 lb  BSA:     1.67 m2  BP:     153  /     86 mmHg  Exam Date:     10/03/2020 9:09 AM  Site Location:     [facility]  Patient Status:     Outpatient    Exam Type:     ECHO CARDIOGRAM  COMPLETE    Indications      I42.8 - Nonischemic cardiomyopathy (HCC)      History/Risk Factors  Hypertension:     Yes  Dyslipidemia:     Yes  Diabetic Therapy:     Oral  Coronary Artery Disease (CAD)     Yes  Diabetes Mellitus:     Type II  Tobacco Use:     Former      Prior Interventions  Pacemaker:     Yes  ICD:     Yes      Left Ventricle    Left ventricular systolic function is mildly reduced with an ejection  fraction of 45% visually (43 % by Simpson's biplane).    Global hypokinesis of the left ventricle.    Left ventricular chamber size is normal.    There is asymmetrical left ventricular hypertrophy with a mildly thickened  septal wall.    Left ventricular diastolic function: indeterminate.    Right Ventricle    Right ventricular systolic function is normal with TAPSE measuring 1.80 cm.    Right ventricular chamber dimension is normal.    A linear echo consistent with device lead is seen in the right ventricle.      Left Atrium    Left atrial chamber is normal with a left atrial volume index of 24.70  ml/m^2 by BP MOD.    Right Atrium    Right atrial chamber size is enlarged.    A linear echo consistent with device lead is seen in the right atrium.      Aortic Valve    The aortic valve is tricuspid.    There is no aortic valve stenosis.    There is mild diffuse thickening (sclerosis) of the aortic valve cusps.    There is trace aortic valve regurgitation.    Pulmonic Valve    The pulmonic valve is not well visualized.    There is no pulmonic valve stenosis.    There is no pulmonic regurgitation.    Mitral Valve    The mitral valve has normal leaflets.    There is no mitral valve stenosis.    There is trace mitral valve regurgitation.    Tricuspid Valve    The tricuspid valve leaflets are normal.    There is no tricuspid valve stenosis.    There is moderate tricuspid valve regurgitation.    Mild pulmonary hypertension, estimated pulmonary arterial systolic pressure  is 44 mmHg.      Pericardium/Pleural     There is trivial pericardial effusion.    Inferior Vena Cava    Normal inferior vena cava with >50% collapse upon inspiration consistent  with normal right atrial pressure, 3 mmHg.    Aorta    The aortic root at the sinus of Valsalva is normal measuring 3.1 cm with an  index of 1.86 cm/m2.    The aortic measurements are indexed to age and body surface area.      Mitral Valve  ----------------------------------------------------------------------  Name                                 Value        Normal  ----------------------------------------------------------------------    MV Diastolic Function  ----------------------------------------------------------------------  MV E Peak Velocity              38.30 cm/s                 MV A Peak Velocity             114.00 cm/s                 MV E/A  0.34        <=1.60   MV A Wave Duration                  124 ms                   MV Annular TDI  ----------------------------------------------------------------------  MV Septal e' Velocity            3.96 cm/s        >=7.00   MV Lateral e' Velocity          11.80 cm/s       >=10.00   MV e' Average                         7.88                 MV E/e' (Average)                     6.46       <=14.00    Tricuspid Valve  ----------------------------------------------------------------------  Name                                 Value        Normal  ----------------------------------------------------------------------    TV Regurgitation Doppler  ----------------------------------------------------------------------  TR Peak Velocity               320.00 cm/s      <=280.00   TR Peak Gradient                   41 mmHg                   Estimated PAP/RSVP  ----------------------------------------------------------------------  RA Pressure                         3 mmHg            <8   PA Systolic Pressure               44 mmHg           <35   RV Systolic Pressure               44 mmHg            <36    Aorta  ----------------------------------------------------------------------  Name                                 Value        Normal  ----------------------------------------------------------------------    Ascending Aorta  ----------------------------------------------------------------------  Sinus of Valsalva Diameter          3.1 cm         <=3.3   Sinus of Valsalva Index         1.86 cm/m2        <=2.00    Aortic Valve  ----------------------------------------------------------------------  Name                                 Value        Normal  ----------------------------------------------------------------------    AV Doppler  ----------------------------------------------------------------------  AV Peak Velocity  132.67 cm/s                 AV Peak Gradient                    7 mmHg    Ventricles  ----------------------------------------------------------------------  Name                                 Value        Normal  ----------------------------------------------------------------------    LV Dimensions 2D/MM  ----------------------------------------------------------------------  IVS Diastolic Thickness (2D)       4.13 cm        <=0.94   LVID Diastole (2D)                 4.42 cm     3.80-5.20   LVPW Diastolic Thickness  (2D)                               0.86 cm        <=0.94   LVID Systole (2D)                  3.87 cm     2.20-3.50   LVID Diastolic Index (2D)       2.65 cm/m2     2.30-3.10   LV Mass (2D Cubed)                152.12 g  67.00-162.00   LV Mass Index (2D Cubed)        0.01 g/cm2     0.00-0.01   Relative Wall Thickness (2D)          0.39                   LV Fractional Shortening/Ejection Fraction 2D/MM  ----------------------------------------------------------------------  LV EF (BP MOD)                        43 %         54-74   LV Diastolic Volume (BP MOD)      24.40 ml  46.00-106.00   LV Diastolic Volume Index  (BP MOD)                       47.61  ml/m2   29.00-61.00   LV Systolic Volume (BP MOD)       45.50 ml   14.00-42.00   LV Systolic Volume Index (BP  MOD)                           27.28 ml/m2    8.00-24.00   LV Diastolic Length (4C)           7.76 cm                 LV Systolic Length (4C)            6.79 cm                 LV Stroke Volume (4C MOD)         26.80 ml                   RV Dimensions 2D/MM  ----------------------------------------------------------------------  RV Basal Diastolic Dimension       3.24  cm     2.50-4.10   RV Mid-Cavity Diastolic  Dimension                          2.08 cm     1.90-3.50   TAPSE                               1.8 cm         >=1.7   TAPSV                            8.90 cm/s    Atria  ----------------------------------------------------------------------  Name                                 Value        Normal  ----------------------------------------------------------------------    LA Dimensions  ----------------------------------------------------------------------  LA Dimension (2D)                  3.20 cm     2.70-3.80   LA Dimen Index (2D)             1.92 cm/m2                 LA Volume (BP MOD)                41.20 ml                 LA Volume Index (BP MOD)       24.70 ml/m2   16.00-34.00     RA Dimensions  ----------------------------------------------------------------------  RA Diastolic Major Axis  Length (4C)                        4.74 cm                 RA Diastolic Major Axis  Length Index (4C)                     0.28                 RA Area (4C)                     15.80 cm2        <21.00   RA ESV (4C MOD)                   45.10 ml   15.00-27.00   RA ESV Index (4C MOD)          27.04 ml/m2       <=27.00      Report Signatures  Finalized by Roque Cash  MD on 10/03/2020 10:28 AM      Technical Quality:     Adequate image quality    Any Known Allergies:     pcn  sulfa    Staff  Ordering Provider:     MCKECHNIE, Berneta Sages ; MCKECHNIE, RONALD S  Referring Provider:     Reinaldo Berber,  SHIRIN F ;    Complete transthoracic echocardiogram performed with 2D imaging, color  Doppler, and spectral Doppler.    [id number]    Sonographer:     Ezzie Dural RCS  Sharlyn Bologna, PA-C  [facility]

## 2022-11-28 ENCOUNTER — Ambulatory Visit (HOSPITAL_BASED_OUTPATIENT_CLINIC_OR_DEPARTMENT_OTHER): Payer: Medicare Other

## 2022-11-28 ENCOUNTER — Encounter (HOSPITAL_BASED_OUTPATIENT_CLINIC_OR_DEPARTMENT_OTHER): Payer: Self-pay | Admitting: Cardiology

## 2022-11-28 ENCOUNTER — Ambulatory Visit (HOSPITAL_BASED_OUTPATIENT_CLINIC_OR_DEPARTMENT_OTHER): Payer: Medicare Other | Admitting: Cardiology

## 2022-11-28 ENCOUNTER — Other Ambulatory Visit (HOSPITAL_BASED_OUTPATIENT_CLINIC_OR_DEPARTMENT_OTHER): Payer: Medicare Other

## 2022-11-28 VITALS — BP 140/86 | HR 83 | Ht 66.75 in | Wt 121.3 lb

## 2022-11-28 DIAGNOSIS — R002 Palpitations: Secondary | ICD-10-CM

## 2022-11-28 DIAGNOSIS — I10 Essential (primary) hypertension: Secondary | ICD-10-CM | POA: Diagnosis not present

## 2022-11-28 DIAGNOSIS — Z716 Tobacco abuse counseling: Secondary | ICD-10-CM

## 2022-11-28 DIAGNOSIS — E7800 Pure hypercholesterolemia, unspecified: Secondary | ICD-10-CM | POA: Diagnosis not present

## 2022-11-28 DIAGNOSIS — Z7189 Other specified counseling: Secondary | ICD-10-CM

## 2022-11-28 DIAGNOSIS — F17210 Nicotine dependence, cigarettes, uncomplicated: Secondary | ICD-10-CM

## 2022-11-28 NOTE — Patient Instructions (Addendum)
Medication Instructions:  Continue current medications  *If you need a refill on your cardiac medications before your next appointment, please call your pharmacy*   Lab Work: None Ordered   Testing/Procedures: Your physician has recommended that you wear a 2 weeks Zio monitor. Holter monitors are medical devices that record the heart's electrical activity. Doctors most often use these monitors to diagnose arrhythmias. Arrhythmias are problems with the speed or rhythm of the heartbeat. The monitor is a small, portable device. You can wear one while you do your normal daily activities. This is usually used to diagnose what is causing palpitations/syncope (passing out).   Follow-Up: At [facility], you and your health needs are our priority.  As part of our continuing mission to provide you with exceptional heart care, we have created designated Provider Care Teams.  These Care Teams include your primary Cardiologist (physician) and Advanced Practice Providers (APPs -  Physician Assistants and Nurse Practitioners) who all work together to provide you with the care you need, when you need it.  We recommend signing up for the patient portal called "MyChart".  Sign up information is provided on this After Visit Summary.  MyChart is used to connect with patients for Virtual Visits (Telemedicine).  Patients are able to view lab/test results, encounter notes, upcoming appointments, etc.  Non-urgent messages can be sent to your provider as well.   To learn more about what you can do with MyChart, go to [email or web address].    Your next appointment:   As Needed  OTHER:  ZIO XT- Long Term Monitor Instructions  Your physician has requested you wear a ZIO patch monitor for 14 days.  This is a single patch monitor. [employer] supplies one patch monitor per enrollment. Additional stickers are not available. Please do not apply patch if you will be having a Nuclear Stress Test,   Echocardiogram, Cardiac CT, MRI, or Chest Xray during the period you would be wearing the  monitor. The patch cannot be worn during these tests. You cannot remove and re-apply the  ZIO XT patch monitor.  Your ZIO patch monitor will be mailed 3 day USPS to your address on file. It may take 3-5 days  to receive your monitor after you have been enrolled.  Once you have received your monitor, please review the enclosed instructions. Your monitor  has already been registered assigning a specific monitor serial # to you.  Billing and Patient Assistance Program Information  We have supplied [employer] with any of your insurance information on file for billing purposes. [employer] offers a sliding scale Patient Assistance Program for patients that do not have  insurance, or whose insurance does not completely cover the cost of the ZIO monitor.  You must apply for the Patient Assistance Program to qualify for this discounted rate.  To apply, please call [employer] at [phone], select option 4, select option 2, ask to apply for  Patient Assistance Program. Meredeth Ide will ask your household income, and how many people  are in your household. They will quote your out-of-pocket cost based on that information.  [employer] will also be able to set up a 25-month, interest-free payment plan if needed.  Applying the monitor   Shave hair from upper left chest.  Hold abrader disc by orange tab. Rub abrader in 40 strokes over the upper left chest as  indicated in your monitor instructions.  Clean area with 4 enclosed alcohol pads. Let dry.  Apply patch as indicated in monitor instructions. Patch will  be placed under collarbone on left  side of chest with arrow pointing upward.  Rub patch adhesive wings for 2 minutes. Remove white label marked "1". Remove the white  label marked "2". Rub patch adhesive wings for 2 additional minutes.  While looking in a mirror, press and release button in center of patch. A small  green light will  flash 3-4 times. This will be your only indicator that the monitor has been turned on.  Do not shower for the first 24 hours. You may shower after the first 24 hours.  Press the button if you feel a symptom. You will hear a small click. Record Date, Time and  Symptom in the Patient Logbook.  When you are ready to remove the patch, follow instructions on the last 2 pages of Patient  Logbook. Stick patch monitor onto the last page of Patient Logbook.  Place Patient Logbook in the blue and white box. Use locking tab on box and tape box closed  securely. The blue and white box has prepaid postage on it. Please place it in the mailbox as  soon as possible. Your physician should have your test results approximately 7 days after the  monitor has been mailed back to [facility].  Call [facility] Customer Care at [phone] if you have questions regarding  your ZIO XT patch monitor. Call them immediately if you see an orange light blinking on your  monitor.  If your monitor falls off in less than 4 days, contact our Monitor department at [phone].  If your monitor becomes loose or falls off after 4 days call [employer] at [phone] for  suggestions on securing your monitor

## 2023-01-06 ENCOUNTER — Ambulatory Visit: Payer: Medicare Other | Admitting: Family

## 2023-01-06 VITALS — BP 146/92 | HR 70 | Resp 18 | Ht 66.75 in | Wt 120.6 lb

## 2023-01-06 DIAGNOSIS — Z23 Encounter for immunization: Secondary | ICD-10-CM | POA: Diagnosis not present

## 2023-01-06 DIAGNOSIS — E210 Primary hyperparathyroidism: Secondary | ICD-10-CM

## 2023-01-06 DIAGNOSIS — T148XXA Other injury of unspecified body region, initial encounter: Secondary | ICD-10-CM | POA: Diagnosis not present

## 2023-01-06 LAB — CBC WITH DIFFERENTIAL/PLATELET
Basophils Absolute: 0 10*3/uL (ref 0.0–0.1)
Basophils Relative: 0.4 % (ref 0.0–3.0)
Eosinophils Absolute: 0 10*3/uL (ref 0.0–0.7)
Eosinophils Relative: 0.3 % (ref 0.0–5.0)
HCT: 41.2 % (ref 36.0–46.0)
Hemoglobin: 13.4 g/dL (ref 12.0–15.0)
Lymphocytes Relative: 20.7 % (ref 12.0–46.0)
Lymphs Abs: 1.8 10*3/uL (ref 0.7–4.0)
MCHC: 32.5 g/dL (ref 30.0–36.0)
MCV: 93.6 fL (ref 78.0–100.0)
Monocytes Absolute: 0.6 10*3/uL (ref 0.1–1.0)
Monocytes Relative: 6.5 % (ref 3.0–12.0)
Neutro Abs: 6.4 10*3/uL (ref 1.4–7.7)
Neutrophils Relative %: 72.1 % (ref 43.0–77.0)
Platelets: 212 10*3/uL (ref 150.0–400.0)
RBC: 4.4 Mil/uL (ref 3.87–5.11)
RDW: 15.8 % — ABNORMAL HIGH (ref 11.5–15.5)
WBC: 8.8 10*3/uL (ref 4.0–10.5)

## 2023-01-06 NOTE — Progress Notes (Signed)
Mary Fitzpatrick is a 82 y.o. female with the following history as recorded in EpicCare:  Patient Active Problem List   Diagnosis Date Noted   S/P ORIF (open reduction internal fixation) fracture 01/15/2021   Hyperkalemia 12/20/2020   Primary hyperparathyroidism (HCC) 12/20/2020   Need for immunization against influenza 12/20/2020   Hypertension 07/20/2020   Hyperlipidemia 07/20/2020   Osteoporosis 07/20/2020   Tobacco abuse 07/20/2020    Current Outpatient Medications  Medication Sig Dispense Refill   alendronate (FOSAMAX) 70 MG tablet Take 1 tablet (70 mg total) by mouth every 7 (seven) days. Take with a full glass of water on an empty stomach. 4 tablet 11   amLODipine (NORVASC) 2.5 MG tablet Take 1 tablet (2.5 mg total) by mouth daily. 90 tablet 3   atorvastatin (LIPITOR) 20 MG tablet Take 1 tablet (20 mg total) by mouth every morning. 90 tablet 3   Calcium Carbonate-Vit D-Min (CALTRATE 600+D PLUS PO) Take 1 tablet by mouth daily.     cholecalciferol (VITAMIN D3) 25 MCG (1000 UNIT) tablet Take 1,000 Units by mouth daily. Per patient taking 2,000 units daily     No current facility-administered medications for this visit.    Allergies: Hctz [hydrochlorothiazide]  Past Medical History:  Diagnosis Date   Arthritis    Hyperlipidemia    Hypertension    Parathyroid abnormality (HCC)    nodule on one of the glands    Past Surgical History:  Procedure Laterality Date   BREAST EXCISIONAL BIOPSY Left    BREAST EXCISIONAL BIOPSY Right    GASTRECTOMY     Due to a ruptured bleeding ulcer   LAPAROTOMY     exploratory kink in bowel due to water sking   ORIF HUMERUS FRACTURE Left 01/15/2021   Procedure: OPEN REDUCTION INTERNAL FIXATION (ORIF) PROXIMAL HUMERUS FRACTURE;  Surgeon: Francena Hanly, MD;  Location: [facility];  Service: Orthopedics;  Laterality: Left;   TONSILLECTOMY      No family history on file.  Social History   Tobacco Use   Smoking status: Some Days    Types: Cigarettes    Smokeless tobacco: Never  Substance Use Topics   Alcohol use: Yes    Alcohol/week: 2.0 standard drinks of alcohol    Types: 2 Glasses of wine per week    Comment: most nights    Subjective:   Presents for 4 month follow up to check on calcium/ PTH;  Does have known history of white coat HTN- does monitor at home and is always normal;  Would like flu shot today;   Objective:  Vitals:   01/06/23 1259 01/06/23 1448  BP: (!) 142/84 (!) 146/92  Pulse: 70   Resp: 18   SpO2: 98%   Weight: 120 lb 9.6 oz (54.7 kg)   Height: 5' 6.75" (1.695 m)     General: Well developed, well nourished, in no acute distress  Skin : Warm and dry. Bruising noted over forearms Head: Normocephalic and atraumatic  Lungs: Respirations unlabored; clear to auscultation bilaterally without wheeze, rales, rhonchi  CVS exam: normal rate and regular rhythm.  Neurologic: Alert and oriented; speech intact; face symmetrical; moves all extremities well; CNII-XII intact without focal deficit   Assessment:  1. Primary hyperparathyroidism (HCC)   2. Bruising   3. Need for influenza vaccination     Plan:  Will update labs today; follow up to be determined; to consider second opinion with different endocrinologist;   Flu shot updated;   No follow-ups on file.  Orders Placed This Encounter  Procedures   Flu Vaccine Trivalent High Dose (Fluad)   PTH, intact and calcium   CBC with Differential/Platelet    Requested Prescriptions    No prescriptions requested or ordered in this encounter

## 2023-01-07 LAB — PTH, INTACT AND CALCIUM
Calcium: 11.5 mg/dL — ABNORMAL HIGH (ref 8.6–10.4)
PTH: 37 pg/mL (ref 16–77)

## 2023-01-08 ENCOUNTER — Ambulatory Visit: Payer: Medicare Other | Admitting: Family

## 2023-01-13 ENCOUNTER — Other Ambulatory Visit: Payer: Self-pay | Admitting: Family

## 2023-01-13 DIAGNOSIS — M810 Age-related osteoporosis without current pathological fracture: Secondary | ICD-10-CM

## 2023-01-15 ENCOUNTER — Telehealth (HOSPITAL_BASED_OUTPATIENT_CLINIC_OR_DEPARTMENT_OTHER): Payer: Self-pay

## 2023-01-15 NOTE — Telephone Encounter (Addendum)
Seen by patient Mary Fitzpatrick on 01/15/2023  9:54 AM; follow up mychart message sent to patient.    ----- Message from Jodelle Red sent at 01/15/2023  8:59 AM EDT ----- Monitor with frequent but brief atrial ectopy. Intermittent SVT but very short, about 10 secs was the longest. No patient triggered events. Based on this, not high risk. If the symptoms are very bothersome, we can do a low dose metoprolol. If they do not limit her, it's ok not to treat. I would use metoprolol succinate 12.5 mg daily to start if she is interested. Thanks!

## 2023-01-22 IMAGING — RF DG SHOULDER 2+V*L*
1 series · 4 of 4 positions shown · non-contrast
Comparison: None.

CLINICAL DATA: surgery; ORIF Left proximal humerus

EXAM:
DG C-ARM 1-60 MIN; LEFT SHOULDER - 2+ VIEW
FLUOROSCOPY TIME:  Fluoroscopy Time:  48 seconds
Number of Acquired Spot Images: 3

[Series 1: unknown protocol · 0.14mm/px · 4 of 4 slices shown]
[im 1/4]
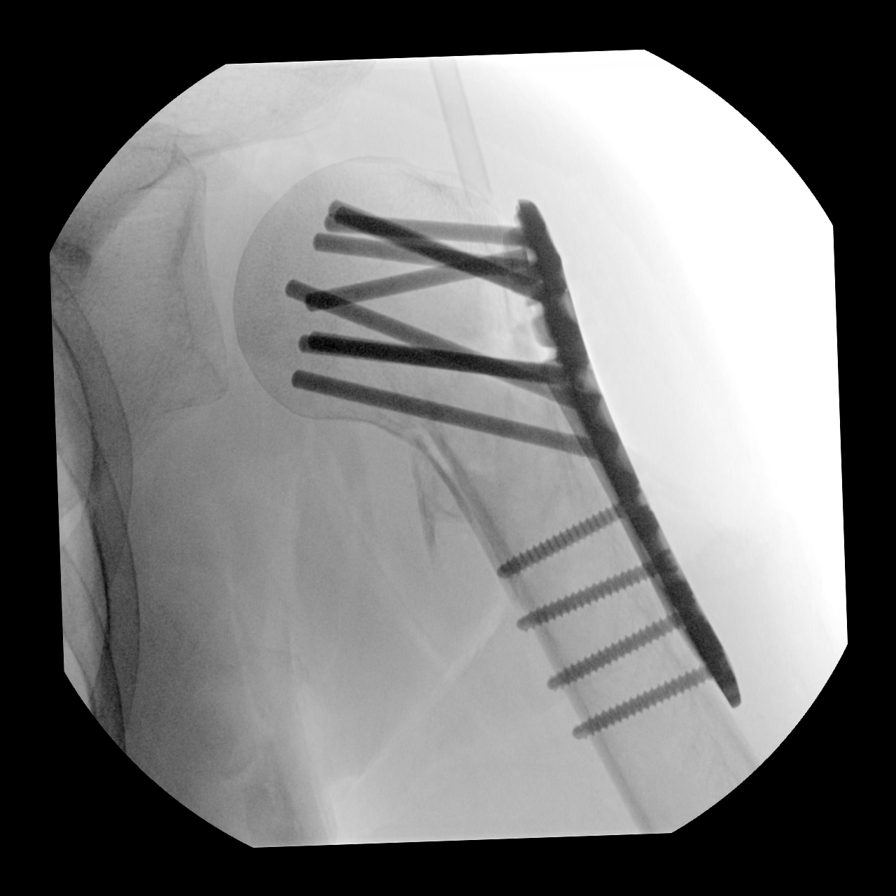
[im 2/4]
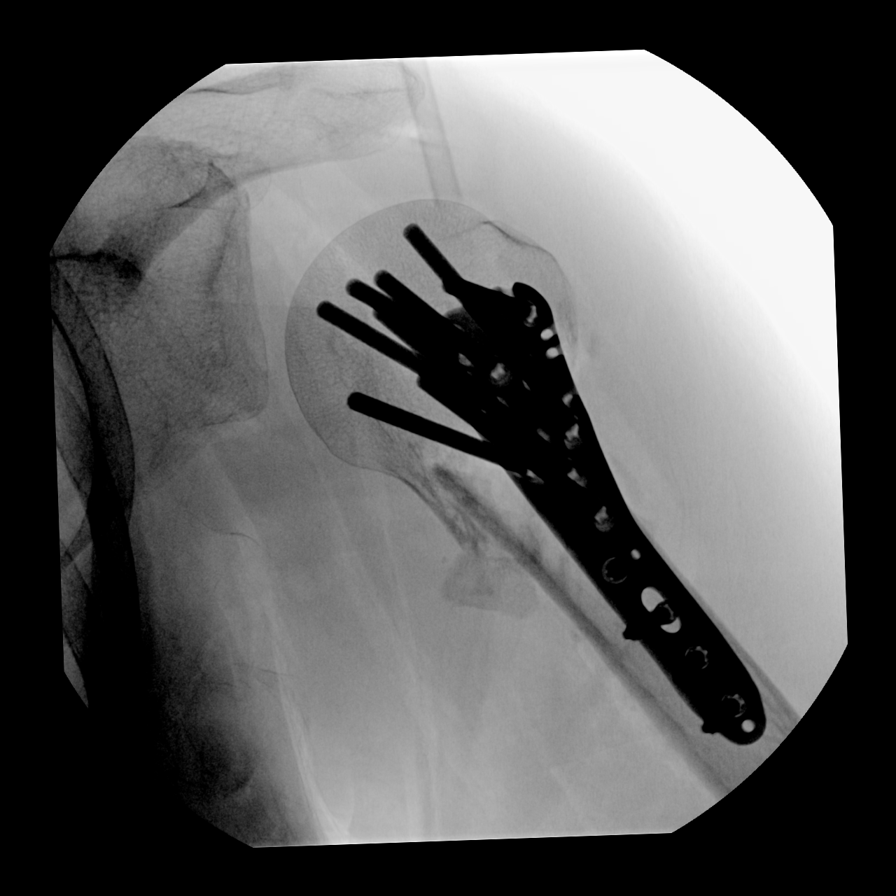
[im 3/4]
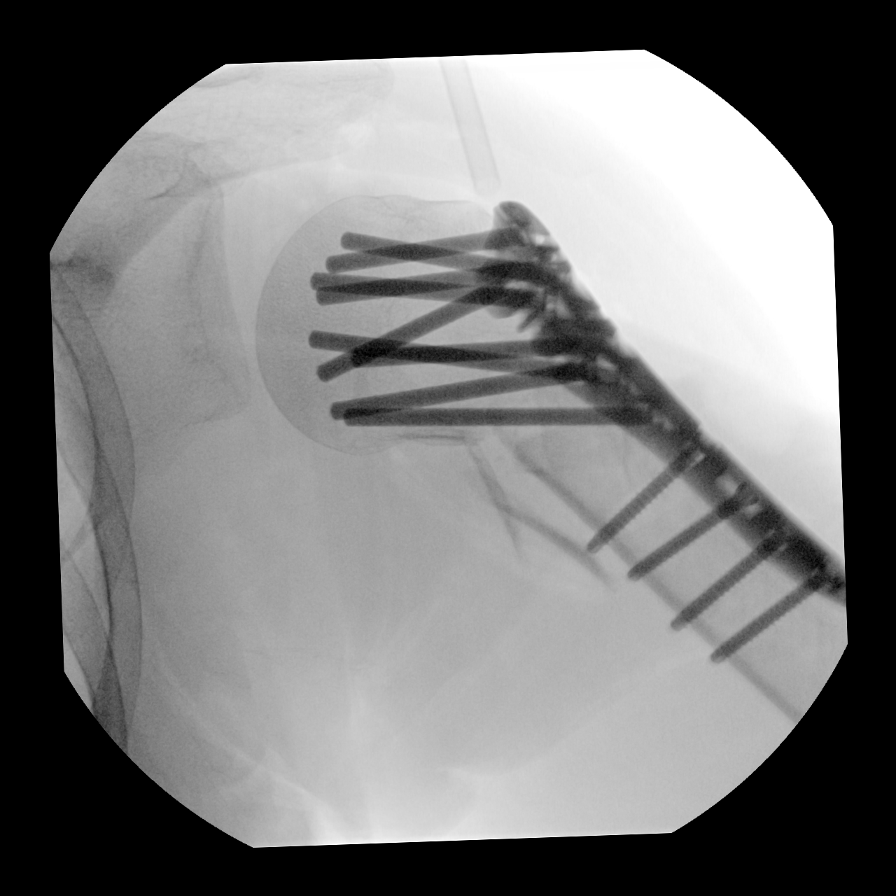
[im 4/4]
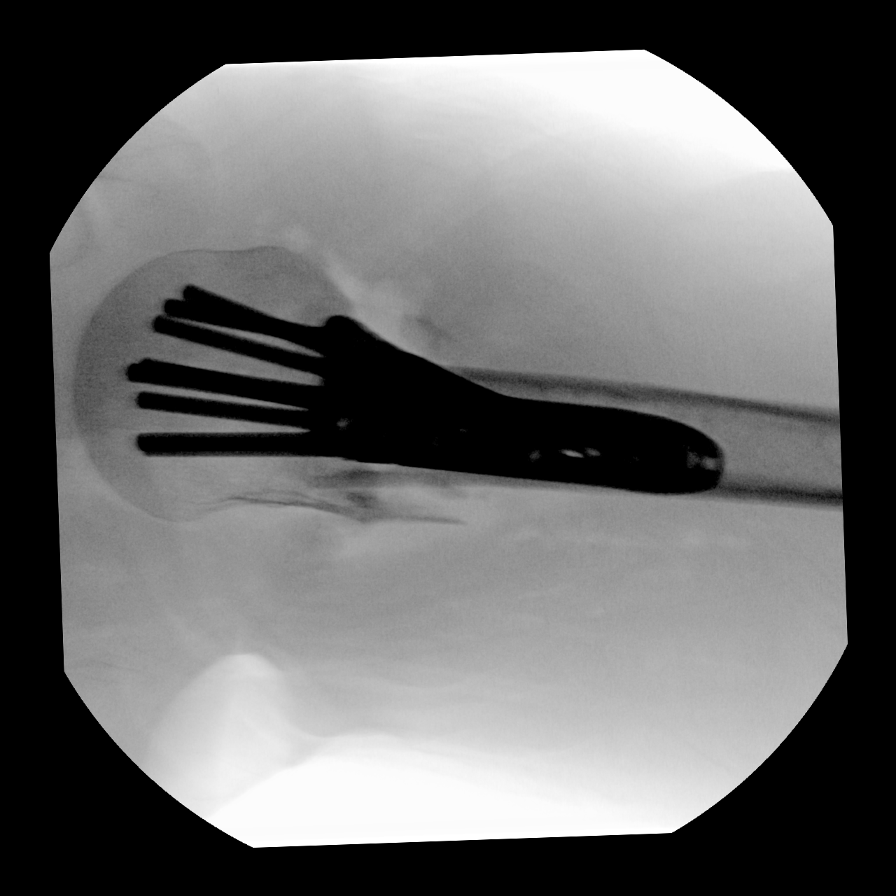

[4 of 4 positions shown; findings below may reference images not displayed]

FINDINGS: Three C-arm fluoroscopic images were obtained intraoperatively and
submitted for post operative interpretation. These images
demonstrate plate screw fixation of a proximal humerus fracture.
Medially displaced fracture fragment. Please see the performing
provider's procedural report for further detail.
IMPRESSION: Intraoperative fluoroscopy, as detailed above.

## 2023-06-04 NOTE — Progress Notes (Signed)
 [facility]     Office Follow Up      Patient Kathryn [name], 83 y.o. female   MRN [id number]   Location [facility]   Date  06/04/2023   Cardiologist Dr. Phil [name]     Chief Complaint: Follow-Up Office Visit    Assessment & Plan:    NICM/HFrEF  Chronic LBBB  EF improved on last echo to 45%.   GDMT with Coreg, Entresto and Aldactone.   P limits further titration.   She is compensated on exam.  Continue Lasix and Aldactone.  Continue with diet low in sodium.    S/p BiVICD   Recent gen change 10/2022  Routine device checks through device clinic     Hypertension  Continue current treatments with Coreg, Entresto, Aldactone    Dyslipidemia  Routine blood work including cholesterol is checked through Kathryn Slate, MD. Goal LDL <70. Patient denies myalgias. Continue statin with routine LFT check. Discussed low cholesterol and heart healthy diet.    Follow up will be scheduled in: 1 year     Healthy lifestyle has been encouraged including avoidance of tobacco, heart healthy diet, regular exercise and maintaining an optimal BMI.     The patient has been encouraged to seek emergency care in the interim should signs or symptoms dictate. The patient has also been encouraged to contact the office for any concerns or questions       Problem List:    NICM/HFrEF  S/p BiVICD implanted in 2010, with gen change in the last 2-3 years   NST 08/25/19 no ischemia   Echo 02/29/2020 EF 34%  Echo 10/03/20 EF 45%, LVH, mod TR, PAP 44 mmHg   S/p gen change 11/06/22 Kathryn [name])   Hypertension  Dyslipidemia     HPI:  Kathryn [name] is a 83 y.o. year old followed by Dr. Phil [name] who presents today in follow up for NICM/HFrEF. Recently had gen change on ICD 10/2022.  She has no concerns today and is doing well. She denies palpitations, lightheaded/dizziness. She has not had any chest pain, pressure or increased shortness of breath. No edema. Husband is with her today. Only concern is her appetite has decreased but otherwise stable.      Is the patient a current smoker? No  Has the patient been compliant with her medications? Yes      Review of Systems:   She denies any chest pain, pressure, shortness of breath, palpitations, syncope, orthopnea, PND, edema, nausea/vomiting.         Medications:  Home Medication List - Marked as Reviewed on 06/04/23 1045   Medication Sig   allopurinol (ZYLOPRIM) 100 mg PO TABS Take 1 Tab by Mouth Once a Day.   aspirin EC 81 mg PO TBEC Take 1 Tab by Mouth Once a Day.   atorvastatin (LIPITOR) 20 mg PO TABS Take 1 Tab by Mouth Every Night at Bedtime.   carvediloL (COREG) 12.5 mg PO TABS Take 1 Tab by Mouth 2 Times Daily with Meals.   donepeziL (ARICEPT) 10 mg PO TABS Take 1 Tab by Mouth Every Night at Bedtime.   furosemide (LASIX) 20 mg PO TABS Take 1 Tab by Mouth Once a Day.   metFORMIN ER (GLUCOPHAGE ER) 500 mg PO TB24 24 hour tablet Take 1 Tab by Mouth Daily Before Supper.   mirtazapine (REMERON) 15 mg PO TABS    sacubitriL-valsartan (ENTRESTO) 49-51 mg PO TABS Take 1 Tab by Mouth Twice Daily.   spironolactone (ALDACTONE)  25 mg PO TABS Take 0.5 Tabs by Mouth Once a Day.   traZODone (DESYREL) 50 mg PO TABS Take 1 Tab by Mouth Every Night at Bedtime.       Allergies:  Allergies   Allergen Reactions   . Penicillins hives   . Sulfa (Sulfonamide Antibiotics) hives       Social History:  The patient's social history (including tobacco usage) was reviewed and updated as appropriate.    Social History     Tobacco Use   . Smoking status: Former     Current packs/day: 0.00     Types: Cigarettes     Quit date: 1969     Years since quitting: 56.2   . Smokeless tobacco: Never   Vaping Use   . Vaping status: Never Used   Substance Use Topics   . Alcohol use: No   . Drug use: No         Physical Exam:  BP 111/63   Pulse 64   Ht 5\' 5"  (1.651 m)   Wt 56.4 kg (124 lb 6.4 oz)   BMI 20.70 kg/m      CONSTITUTIONAL:   Well developed, nourished, no distress   CARDIOVASCULAR:  Regular rate (tachy) and rhythm  No murmur, rub or  gallop   No edema   PULMONARY/CHEST WALL: Clear to auscultation bilaterally  Normal effort   SKIN: Warm  Dry and intact   PSYCH: Demonstrates good judgement and insight into his or her medical condition  Alert and oriented x 3       Laboratory Data Reviewed:    CBC  Lab Results   Component Value Date/Time    WBC 5.1 10/30/2022 11:12 AM    RBC 3.88 10/30/2022 11:12 AM    HEMOGLOBIN 12.0 10/30/2022 11:12 AM    HCT 36.1 10/30/2022 11:12 AM    MCV 93 10/30/2022 11:12 AM    MCH 31 10/30/2022 11:12 AM    MCHC 33 10/30/2022 11:12 AM    RDW 14.0 10/30/2022 11:12 AM    PLATELET 320 10/30/2022 11:12 AM    MPV 8.8 (L) 10/30/2022 11:12 AM        Basic Metabolic Profile  Lab Results   Component Value Date    NA 135 10/30/2022    POTASSIUM 4.6 10/30/2022    CHLORIDE 99 10/30/2022    CO2 28 10/30/2022    BUN 13 10/30/2022    CREAT 1.6 (H) 10/30/2022    GLUCOSE 119 (H) 10/30/2022    CALCIUM 9.7 10/30/2022       Last Lipid Profile  No results found for: "CHOLESTEROL", "HDL", "LDLIPO", "TRIGLYCERIDE"      Ancillary Data Reviewed:    EKG independently reviewed: not performed     Echo:  Results for orders placed or performed in visit on 10/03/20   Echo Cardiogram Complete   Result Value Ref Range    EF Echo 45     Narrative    CONCLUSIONS    * Left ventricular systolic function is mildly reduced with an ejection  fraction of 45% visually (43 % by Simpson's biplane).    * Global hypokinesis of the left ventricle.    * Left ventricular chamber size is normal.    * There is asymmetrical left ventricular hypertrophy with a mildly thickened  septal wall.    * Left ventricular diastolic function: indeterminate.    * Right ventricular systolic function is normal with TAPSE measuring 1.80  cm.    *  Right ventricular chamber dimension is normal.    * Right atrial chamber size is enlarged.    * Mild pulmonary hypertension, estimated pulmonary arterial systolic  pressure is 44 mmHg.    * There is moderate tricuspid valve  regurgitation.    Comparison    * Compared to prior study from 02/29/2020.    * EF was 34% with mild LVH. Normal RV size and function. Mild to moderate  TR. RVSP was 32 mmHg.      Patient Info  Name:     Kathryn [name]  Age:     3 years  DOB:     [birth year]  Gender:     Female  MRN:     [id number]  Ht:     62 in  Wt:     138 lb  BSA:     1.67 m2  BP:     153  /     86 mmHg  Exam Date:     10/03/2020 9:09 AM  Site Location:     [facility]  Patient Status:     Outpatient    Exam Type:     ECHO CARDIOGRAM COMPLETE    Indications      I42.8 - Nonischemic cardiomyopathy (HCC)      History/Risk Factors  Hypertension:     Yes  Dyslipidemia:     Yes  Diabetic Therapy:     Oral  Coronary Artery Disease (CAD)     Yes  Diabetes Mellitus:     Type II  Tobacco Use:     Former      Prior Interventions  Pacemaker:     Yes  ICD:     Yes      Left Ventricle    Left ventricular systolic function is mildly reduced with an ejection  fraction of 45% visually (43 % by Simpson's biplane).    Global hypokinesis of the left ventricle.    Left ventricular chamber size is normal.    There is asymmetrical left ventricular hypertrophy with a mildly thickened  septal wall.    Left ventricular diastolic function: indeterminate.    Right Ventricle    Right ventricular systolic function is normal with TAPSE measuring 1.80 cm.    Right ventricular chamber dimension is normal.    A linear echo consistent with device lead is seen in the right ventricle.      Left Atrium    Left atrial chamber is normal with a left atrial volume index of 24.70  ml/m^2 by BP MOD.    Right Atrium    Right atrial chamber size is enlarged.    A linear echo consistent with device lead is seen in the right atrium.      Aortic Valve    The aortic valve is tricuspid.    There is no aortic valve stenosis.    There is mild diffuse thickening (sclerosis) of the aortic valve cusps.    There is trace aortic valve regurgitation.    Pulmonic Valve    The pulmonic valve is not well  visualized.    There is no pulmonic valve stenosis.    There is no pulmonic regurgitation.    Mitral Valve    The mitral valve has normal leaflets.    There is no mitral valve stenosis.    There is trace mitral valve regurgitation.    Tricuspid Valve    The tricuspid valve leaflets are normal.    There is no  tricuspid valve stenosis.    There is moderate tricuspid valve regurgitation.    Mild pulmonary hypertension, estimated pulmonary arterial systolic pressure  is 44 mmHg.      Pericardium/Pleural    There is trivial pericardial effusion.    Inferior Vena Cava    Normal inferior vena cava with >50% collapse upon inspiration consistent  with normal right atrial pressure, 3 mmHg.    Aorta    The aortic root at the sinus of Valsalva is normal measuring 3.1 cm with an  index of 1.86 cm/m2.    The aortic measurements are indexed to age and body surface area.      Mitral Valve  ----------------------------------------------------------------------  Name                                 Value        Normal  ----------------------------------------------------------------------    MV Diastolic Function  ----------------------------------------------------------------------  MV E Peak Velocity              38.30 cm/s                 MV A Peak Velocity             114.00 cm/s                 MV E/A                                0.34        <=1.60   MV A Wave Duration                  124 ms                   MV Annular TDI  ----------------------------------------------------------------------  MV Septal e' Velocity            3.96 cm/s        >=7.00   MV Lateral e' Velocity          11.80 cm/s       >=10.00   MV e' Average                         7.88                 MV E/e' (Average)                     6.46       <=14.00    Tricuspid Valve  ----------------------------------------------------------------------  Name                                 Value         Normal  ----------------------------------------------------------------------    TV Regurgitation Doppler  ----------------------------------------------------------------------  TR Peak Velocity               320.00 cm/s      <=280.00   TR Peak Gradient                   41 mmHg                   Estimated PAP/RSVP  ----------------------------------------------------------------------  RA Pressure  3 mmHg            <8   PA Systolic Pressure               44 mmHg           <35   RV Systolic Pressure               44 mmHg           <36    Aorta  ----------------------------------------------------------------------  Name                                 Value        Normal  ----------------------------------------------------------------------    Ascending Aorta  ----------------------------------------------------------------------  Sinus of Valsalva Diameter          3.1 cm         <=3.3   Sinus of Valsalva Index         1.86 cm/m2        <=2.00    Aortic Valve  ----------------------------------------------------------------------  Name                                 Value        Normal  ----------------------------------------------------------------------    AV Doppler  ----------------------------------------------------------------------  AV Peak Velocity               132.67 cm/s                 AV Peak Gradient                    7 mmHg    Ventricles  ----------------------------------------------------------------------  Name                                 Value        Normal  ----------------------------------------------------------------------    LV Dimensions 2D/MM  ----------------------------------------------------------------------  IVS Diastolic Thickness (2D)       9.56 cm        <=0.94   LVID Diastole (2D)                 4.42 cm     3.80-5.20   LVPW Diastolic Thickness  (2D)                               0.86 cm        <=0.94   LVID Systole (2D)                  3.87  cm     2.20-3.50   LVID Diastolic Index (2D)       2.65 cm/m2     2.30-3.10   LV Mass (2D Cubed)                152.12 g  67.00-162.00   LV Mass Index (2D Cubed)        0.01 g/cm2     0.00-0.01   Relative Wall Thickness (2D)          0.39                   LV Fractional Shortening/Ejection Fraction 2D/MM  ----------------------------------------------------------------------  LV EF (BP MOD)  43 %         54-74   LV Diastolic Volume (BP MOD)      16.10 ml  46.00-106.00   LV Diastolic Volume Index  (BP MOD)                       47.61 ml/m2   29.00-61.00   LV Systolic Volume (BP MOD)       45.50 ml   14.00-42.00   LV Systolic Volume Index (BP  MOD)                           27.28 ml/m2    8.00-24.00   LV Diastolic Length (4C)           7.76 cm                 LV Systolic Length (4C)            6.79 cm                 LV Stroke Volume (4C MOD)         26.80 ml                   RV Dimensions 2D/MM  ----------------------------------------------------------------------  RV Basal Diastolic Dimension       3.24 cm     2.50-4.10   RV Mid-Cavity Diastolic  Dimension                          2.08 cm     1.90-3.50   TAPSE                               1.8 cm         >=1.7   TAPSV                            8.90 cm/s    Atria  ----------------------------------------------------------------------  Name                                 Value        Normal  ----------------------------------------------------------------------    LA Dimensions  ----------------------------------------------------------------------  LA Dimension (2D)                  3.20 cm     2.70-3.80   LA Dimen Index (2D)             1.92 cm/m2                 LA Volume (BP MOD)                41.20 ml                 LA Volume Index (BP MOD)       24.70 ml/m2   16.00-34.00     RA Dimensions  ----------------------------------------------------------------------  RA Diastolic Major Axis  Length (4C)                        4.74 cm                  RA Diastolic Major Axis  Length Index (  4C)                     0.28                 RA Area (4C)                     15.80 cm2        <21.00   RA ESV (4C MOD)                   45.10 ml   15.00-27.00   RA ESV Index (4C MOD)          27.04 ml/m2       <=27.00      Report Signatures  Finalized by Roque Cash  MD on 10/03/2020 10:28 AM      Technical Quality:     Adequate image quality    Any Known Allergies:     pcn  sulfa    Staff  Ordering Provider:     MCKECHNIE, Berneta Sages ; MCKECHNIE, RONALD S  Referring Provider:     Reinaldo Berber, SHIRIN F ;    Complete transthoracic echocardiogram performed with 2D imaging, color  Doppler, and spectral Doppler.    [id number]    Sonographer:     Ezzie Dural RCS           Marnee Guarneri, [place]  [facility]    CC: Kathryn Slate, MD        This note includes all face to face findings that were personally documented by me during the encounter today, 06/04/23. It includes, but is not limited to, my own assessment and plan, personal history, review of systems, and physical examination.

## 2023-06-14 ENCOUNTER — Emergency Department: Admit: 2023-06-14 | Payer: MEDICARE | Primary: Internal Medicine

## 2023-06-14 ENCOUNTER — Inpatient Hospital Stay
Admit: 2023-06-14 | Discharge: 2023-06-14 | Disposition: A | Discharge status: Home / Self Care | Payer: MEDICARE | Attending: Student in an Organized Health Care Education/Training Program

## 2023-06-14 DIAGNOSIS — E871 Hypo-osmolality and hyponatremia: Secondary | ICD-10-CM

## 2023-06-14 DIAGNOSIS — F0390 Unspecified dementia without behavioral disturbance: Secondary | ICD-10-CM

## 2023-06-14 LAB — COMPREHENSIVE METABOLIC PANEL
ALT: 12 U/L (ref 10–49)
AST: 25 U/L (ref 0.0–33.9)
Albumin: 4 g/dL (ref 3.4–5.0)
Alkaline Phosphatase: 98 U/L (ref 46–116)
Anion Gap: 11 mmol/L (ref 5–15)
BUN: 10 mg/dL (ref 9–23)
CO2: 27 meq/L (ref 20–31)
Calcium: 9.8 mg/dL (ref 8.7–10.4)
Chloride: 93 meq/L — ABNORMAL LOW (ref 98–107)
Creatinine: 1.45 mg/dL — ABNORMAL HIGH (ref 0.55–1.02)
GFR African American: 44
GFR Non-African American: 37
Glucose: 92 mg/dL (ref 74–106)
Potassium: 3.7 meq/L (ref 3.5–5.1)
Sodium: 130 meq/L — ABNORMAL LOW (ref 136–145)
Total Bilirubin: 1.2 mg/dL (ref 0.30–1.20)
Total Protein: 7.2 g/dL (ref 5.7–8.2)

## 2023-06-14 LAB — CBC WITH AUTO DIFFERENTIAL
Basophils: 1.3 % (ref 0–3)
Eosinophils: 4.9 % (ref 0–5)
Hematocrit: 35.8 % (ref 35.0–47.0)
Hemoglobin: 11.9 g/dL (ref 11.0–16.0)
Immature Granulocytes %: 0.4 % (ref 0.0–3.0)
Lymphocytes: 28.8 % (ref 28–48)
MCH: 30.6 pg (ref 25.4–34.6)
MCHC: 33.2 g/dL (ref 30.0–36.0)
MCV: 92 fL (ref 80.0–98.0)
MPV: 8.4 fL (ref 6.0–10.0)
Monocytes: 7.7 % (ref 1–13)
Neutrophils Segmented: 56.9 % (ref 34–64)
Nucleated RBCs: 0 (ref 0–0)
Platelets: 274 10*3/uL (ref 140–450)
RBC: 3.89 M/uL (ref 3.60–5.20)
RDW: 46.7 — ABNORMAL HIGH (ref 36.4–46.3)
WBC: 4.7 10*3/uL (ref 4.0–11.0)

## 2023-06-14 LAB — POCT URINALYSIS DIPSTICK
Bilirubin, Urine: NEGATIVE
Blood, Urine: NEGATIVE
Glucose, Ur: NEGATIVE mg/dL
Ketones, Urine: NEGATIVE mg/dL
Leukocyte Esterase, Urine: NEGATIVE
Nitrite, Urine: NEGATIVE
Protein, Urine: NEGATIVE mg/dL
Specific Gravity, UA: 1.01 (ref 1.005–1.030)
Urobilinogen, Urine: 0.2 EU/dl (ref 0.0–1.0)
pH, Urine: 6 (ref 5–9)

## 2023-06-14 LAB — POC CHEM 8
BUN: 12 mg/dL (ref 7–25)
Calcium, Ionized: 4.6 mg/dL (ref 4.40–5.40)
Chloride: 93 meq/L — ABNORMAL LOW (ref 98–107)
Creatinine: 1.6 mg/dL — ABNORMAL HIGH (ref 0.6–1.3)
Glucose: 95 mg/dL (ref 74–106)
Hematocrit: 38 % (ref 38–45)
Hemoglobin: 12.9 g/dL (ref 12.4–17.2)
Potassium: 4.6 meq/L (ref 3.5–4.9)
Sodium: 129 meq/L — ABNORMAL LOW (ref 136–145)
Total CO2: 27 mmol/L (ref 21–32)

## 2023-06-14 LAB — COVID-19, FLU A/B, AND RSV COMBO
Influenza A PCR: NEGATIVE
Influenza B by PCR: NEGATIVE
RSV A/B PCR: NEGATIVE
SARS-CoV-2: NEGATIVE

## 2023-06-14 LAB — AMMONIA: Ammonia: 22 umol/L (ref 11.2–31.7)

## 2023-06-14 LAB — PROBNP, N-TERMINAL: NT Pro-BNP: 505 pg/mL — ABNORMAL HIGH (ref 0.0–125.0)

## 2023-06-14 LAB — TSH: TSH, High Sensitivity: 0.745 u[IU]/mL (ref 0.550–4.780)

## 2023-06-14 MED ORDER — SODIUM CHLORIDE 0.9 % IV BOLUS
0.9 | Freq: Once | INTRAVENOUS | Status: AC
Start: 2023-06-14 — End: 2023-06-14
  Administered 2023-06-14: 18:00:00 500 mL via INTRAVENOUS

## 2023-06-14 NOTE — ED Notes (Signed)
 This Clinical research associate took a phone call initially answered by ED secretary from a person who identified as this patient's daughter. Caller speaking with elevated tone, states to this writer that she needs her grandson, present in the triage WR, to be allowed to come back with the patient. Patient at time of call is in [facility] 2 with husband at bedside. Caller states concern that the patient's husband is "visually impaired and upset about what's going on with his wife and needs the grandson there to be able to speak to the doctor". This writer attempted to communicate that at this time, patient is being treated in a hallway space and unable to accommodate 2 visitors but that i will be working on moving her into a main ED room. The caller continued to interrupt me and repeatedly request that the grandson be allowed back immediately. I again emphasized that i understood her concerns and was attempting to accommodate them but that the grandson would have to continue to wait a short time in the WR until the patient was moved. The caller became increasingly agitated, nearly shouting at this writer, eventually states understanding that her mother would be moving into a room and the grandson would be allowed to be at bedside at that time. Caller states to this Clinical research associate that she is currently driving to the ER to visit her mother. I communicated that the patient would be allowed a total of two visitors only at bedside and she could switch out with her grandson on arrival. Patient moved to bed 31, triage staff made aware that grandson was permitted at bedside at this time and that additional visitor en route/would require switching out with grandson on arrival.      Matt Holmes, [place]  06/14/23 1216

## 2023-06-14 NOTE — ED Notes (Signed)
 Pro bnp added to recent lab draw     Carlye Grippe, RN  06/14/23 1224

## 2023-06-14 NOTE — ED Notes (Signed)
 This Clinical research associate took the patient to the restroom and back to her room. Hooked up to the monitor. 2 family members at bedside.      Moots, Ashley Mariner  06/14/23 1343

## 2023-06-14 NOTE — ED Notes (Signed)
 Bedside and Verbal shift change report given to Alyssa (Cabin crew) by Lowella Bandy (offgoing nurse). Report included the following information Nurse Handoff Report.       Marda Stalker, RN  06/14/23 [phone]

## 2023-06-14 NOTE — ED Notes (Signed)
 Patients family is requesting restraints because the patient is pulling on her IV line. This writer attempted to cover the line but family is still requesting restraints. RN aware.      Moots, Ashley Mariner  06/14/23 1419

## 2023-06-14 NOTE — ED Notes (Signed)
 Pt went to ct     Marda Stalker, [place]  06/14/23 [phone]

## 2023-06-14 NOTE — ED Notes (Signed)
 Report given to Theodoro Parma, RN  06/14/23 [phone]

## 2023-06-14 NOTE — Discharge Instructions (Signed)
 Please return to the emergency department for any worsening symptoms.  Please make sure to stay hydrated.  Schedule follow-up with your primary care and neurology outpatient.

## 2023-06-14 NOTE — Progress Notes (Signed)
 4:13 PM  06/14/23    Discharge instructions given to husband with verbalization of understanding.  Pt accompanied by husband and grandson.  Pt discharged with the following prescriptions      Medication List        ASK your doctor about these medications      allopurinol 100 MG tablet  Commonly known as: ZYLOPRIM     aspirin 81 MG EC tablet     atorvastatin 20 MG tablet  Commonly known as: LIPITOR     carvedilol 12.5 MG tablet  Commonly known as: COREG     donepezil 10 MG tablet  Commonly known as: ARICEPT     Entresto 49-51 MG per tablet  Generic drug: sacubitril-valsartan     furosemide 20 MG tablet  Commonly known as: LASIX     metFORMIN 500 MG extended release tablet  Commonly known as: GLUCOPHAGE-XR     mirtazapine 15 MG tablet  Commonly known as: REMERON     spironolactone 25 MG tablet  Commonly known as: ALDACTONE     traZODone 50 MG tablet  Commonly known as: DESYREL          .  Pt discharged to home.    Rhae Lerner, RN

## 2023-06-14 NOTE — ED Triage Notes (Signed)
 Patient via EMS from home, family endorses increased confusion noticed early this AM. Hx dementia. BGL 116. BEFAST negative

## 2023-06-14 NOTE — ED Notes (Signed)
 Went into pt room d/t Ed tech telling me about pt family requesting restraint. RN let family know, restraints is not needed right now. RN coban pt IV and covered pt arm with pt sweater and blanket. Rn redirected pt to not touch IV. Pt requested to use the restroom. Pt was escorted, by this Clinical research associate, to the restroom. Pt ambulated to restroom and back into stretcher with steady gait and denies any dizziness. Pt is alert and confused. Pt has no needs. Call light within reach. Pt grandson and husband at bedside.      Marda Stalker, RN  06/14/23 [phone]

## 2023-06-14 NOTE — ED Notes (Addendum)
 Patient husband at the bedside. States patient had lab work a few days ago and told her sodium was low. Informed that if pt becomes confused to bring her to ER. Patient began confused this morning not knowing where she is but normally would. Patient hx of dementia but is pleasant and has no complaints. Ambulated to restroom with steady gait and denies any dizziness. Urine sample obtained. IV and labs obtained.           Rachel Moulds, RN  06/14/23 [phone]

## 2023-06-14 NOTE — ED Provider Notes (Signed)
 [facility]  Emergency Department Treatment Report      Patient: Kathryn [name] Age: 83 y.o. Sex: female    Date of Birth: 03/01/1941 Admit Date: 06/14/2023 PCP: Sena Slate, MD   MRN: [id number]  CSN: [id number]     Room: ER31/ER31 Time Dictated: 8:55 PM      Chief Complaint   Chief Complaint   Patient presents with    Altered Mental Status       History of Present Illness   83 y.o. female with a past medical history of AICD, chronic CHF, hypertension, hyperlipidemia, as well as dementia, most of the time she knows where she is, she is presenting to the emergency department because today she did not realize she was at home, Asking her husband to take her home when she was already there.  She has otherwise been behaving her normal self and has had no fevers, coughing, congestion, abdominal pain, nausea, vomiting, urinary symptoms or diarrhea.  Husband states that she is eating/drinking, taking her medications as prescribed.  She is on Lasix daily.  She does not drink very much daily.  A few days ago she had labs through her primary care physician and they noted her sodium to be 129.  They instructed husband to take her to the ER if she were to have any confusion to be assessed for her sodium.    Review of Systems   ROS   As outlined in HPI    Past Medical/Surgical History     Past Medical History:   Diagnosis Date    Arrhythmia     Arthritis     CAD (coronary artery disease)     Dementia (HCC)     Diabetes (HCC)     GERD (gastroesophageal reflux disease)     Heart failure (HCC)     History of left breast cancer     mastectomy    Hypertension     ICD (implantable cardioverter-defibrillator) battery depletion 06/28/2014    Implants: EXPLANTED DEVICE [employer] JUDE [id number] DOI 06/12/08 NEW DEVICE [address] JUDE UNIFY ASSURA [id number] RA LEAD ST JUDE 1999 [id number] RV LEAD ST JUDE 7122Q [id number] LV LEAD 1156T [id number] PROGRAMMED DDD 60-130 VT 1 150 MONITOR VT 2 171 ATPX2,30J,40J VF  200 ATP,30J,40J     Past Surgical History:   Procedure Laterality Date    BREAST SURGERY      Left Mastectomy    COLONOSCOPY N/A 10/22/2016    COLONOSCOPY, SURVEILLANCE performed by Willaim Rayas, MD at [facility] ENDOSCOPY    GYN      Hysterectomy    HEENT      Tosillectomy    PACEMAKER  2010    [employer] - ICD defribrillator/pacemaker       Social History     Social History     Socioeconomic History    Marital status: Married     Spouse name: Not on file    Number of children: Not on file    Years of education: Not on file    Highest education level: Not on file   Occupational History    Not on file   Tobacco Use    Smoking status: Former     Current packs/day: 0.00     Types: Cigarettes     Quit date: 07/01/1967     Years since quitting: 55.9    Smokeless tobacco: Never   Substance and Sexual Activity    Alcohol use: No  Drug use: No    Sexual activity: Not on file   Other Topics Concern    Not on file   Social History Narrative    Not on file     Social Drivers of Health     Financial Resource Strain: Not on file   Food Insecurity: Not on file   Transportation Needs: Not on file   Physical Activity: Not on file   Stress: Not on file   Social Connections: Not on file   Intimate Partner Violence: Not on file   Housing Stability: Not on file       Family History     Family History   Problem Relation Age of Onset    Diabetes Mother     Hypertension Mother     Diabetes Father     Stroke Father     Hypertension Father        Current Medications     No current facility-administered medications for this encounter.     Current Outpatient Medications   Medication Sig Dispense Refill    carvedilol (COREG) 12.5 MG tablet Take 1 tablet by mouth 2 times daily (with meals)      aspirin 81 MG EC tablet Take 1 tablet by mouth daily      metFORMIN (GLUCOPHAGE-XR) 500 MG extended release tablet Take 1 tablet by mouth daily (with breakfast)      allopurinol (ZYLOPRIM) 100 MG tablet Take 100 mg by mouth daily      atorvastatin (LIPITOR)  20 MG tablet Take 1 tablet by mouth nightly      donepezil (ARICEPT) 10 MG tablet nightly.      furosemide (LASIX) 20 MG tablet Take 20 mg by mouth daily      mirtazapine (REMERON) 15 MG tablet Take 15 mg by mouth      sacubitril-valsartan (ENTRESTO) 49-51 MG per tablet Take 1 tablet by mouth 2 times daily      spironolactone (ALDACTONE) 25 MG tablet Take 0.5 tablets by mouth daily      traZODone (DESYREL) 50 MG tablet Take 1 tablet by mouth nightly         Allergies     Allergies   Allergen Reactions    Penicillins Itching    Sulfa Antibiotics Hives       Physical Exam     ED Triage Vitals   BP Systolic BP Percentile Diastolic BP Percentile Temp Temp Source Pulse Respirations SpO2   06/14/23 1037 -- -- 06/14/23 1115 06/14/23 1115 06/14/23 1037 06/14/23 1037 06/14/23 1037   136/72   97.5 F (36.4 C) Oral 60 18 96 %      Height Weight - Scale         06/14/23 1037 06/14/23 1037         1.651 m (5\' 5" ) 63.5 kg (140 lb)            Physical Exam  Vitals and nursing note reviewed.   Constitutional:       Appearance: Normal appearance.   HENT:      Head: Normocephalic and atraumatic.   Cardiovascular:      Rate and Rhythm: Normal rate and regular rhythm.      Pulses: Normal pulses.      Heart sounds: Normal heart sounds. No murmur heard.     No friction rub. No gallop.   Pulmonary:      Effort: Pulmonary effort is normal. No respiratory distress.      Breath  sounds: Normal breath sounds. No wheezing or rales.   Abdominal:      General: Abdomen is flat. There is no distension.      Palpations: Abdomen is soft.      Tenderness: There is no abdominal tenderness. There is no guarding.   Musculoskeletal:         General: Normal range of motion.   Skin:     General: Skin is warm.      Capillary Refill: Capillary refill takes less than 2 seconds.   Neurological:      General: No focal deficit present.      Mental Status: She is alert.      Comments: Answering my questions appropriately, oriented to self, not place or year but  understands situation and what is going on           Impression and Management Plan   83 year old female presenting to the emergency department as she did not realize she was already at home with some mild confusion.    Consider dehydration, AKI, electrolyte abnormality, UTI, thyroid dysfunction, pneumonia, pneumothorax, intracranial process, etc.    Patient denies any chest pain, low suspicion for ACS.    Will obtain basic labs, EKG, chest x-ray, CT head, urinalysis, TSH, UDS, reassess.    Patient noted to be hemodynamically stable, afebrile and very well-appearing, ambulatory without support, pleasant on exam and answering all questions otherwise appropriately.  She denies any symptoms.    She appears mildly dry on examination.  Husband says she does not drink much usually.    EKG nonischemic.  Chest x-ray unremarkable.   Viral swabs negative.  TSH normal.  Pneumonia negative.  NT proBNP normal when age-adjusted.  CBC without any leukocytosis or anemia.  No UTI on urinalysis.  CMP with mild hyponatremia with a sodium of 130, overall not significantly different from sodium of 135 a couple of months ago.  Creatinine is 1.45, which is mildly increased from baseline but not significantly.  CT head with no acute process.    I suspect that patient is mildly dehydrated as she has mild bump in creatinine and decrease sodium without any evidence of fluid overload.  Have given a small bolus of fluids and have encouraged p.o. intake at home.  Patient has no source of infection, no neurodeficits, no overall complaints on examination, and I suspect some mild variation in mental status related to her dementia.  Family is comfortable with this plan and discharge.  Have recommended following up with her PCP, neurologist for further dementia care and returning to the ER for any worsening symptoms, progressing symptoms.    Patient is appropriate for discharge home.  Considered admission however patient is well-appearing, symptoms  have improved here, and I think that patient can be treated symptomatically at home with specialist follow-up as indicated.  Patient is agreeable with this plan to discharge.  I discussed all indications to return to the ER and if any symptoms are worsening.    Diagnostic Studies   Lab:   Results for orders placed or performed during the hospital encounter of 06/14/23   COVID-19, Flu A/B, and RSV Combo    Specimen: Nasopharyngeal Swab   Result Value Ref Range    SARS-CoV-2 NEGATIVE NEGATIVE      Influenza A PCR NEGATIVE NEGATIVE      Influenza B by PCR NEGATIVE NEGATIVE      RSV A/B PCR NEGATIVE NEGATIVE      Narrative    Pregnant?->No  CT HEAD WO CONTRAST    Narrative    CT HEAD WO CONTRAST    HISTORY:confuion, dementia pt    COMPARISON: None.    TECHNIQUE: Axial images were obtained of the head without intravenous contrast.    FINDINGS: No acute intracranial hemorrhage, mass effect or midline shift. No  extra-axial fluid collections. The calvarium is intact. Visualized paranasal  sinuses and mastoid air cells are clear.      Impression    IMPRESSION:  No acute intracranial hemorrhage or extra axial fluid collections.    All CT scans at this facility are performed using low  dose modulation  techniques as appropriate to perform exam including the following: automated  exposure control; use of iterative reconstruction technique; adjustment of the  mA and/or kV according to patient size (this includes techniques or standardized  protocols for targeted exams where dose is matched to indication/reason for  exam).      Electronically signed by: Regan Rakers, MD 06/14/2023 1:26 PM EDT            Workstation ID: [id number]     XR CHEST (2 VW)    Narrative    XR CHEST (2 VW)    INDICATION:. 82 years Female Chest Pain       COMPARISON: 08/01/2022       FINDINGS: PA and lateral views of the chest demonstrates  normal heart size.  Dual-chamber pacing device is noted. The lungs are clear.  There is no evidence  of pneumothorax  or pleural effusion.      Impression    IMPRESSION:        No acute pulmonary findings.       Electronically signed by: Regan Rakers, MD 06/14/2023 12:58 PM EDT            Workstation ID: [id number]     CBC with Auto Differential   Result Value Ref Range    WBC 4.7 4.0 - 11.0 1000/mm3    RBC 3.89 3.60 - 5.20 M/uL    Hemoglobin 11.9 11.0 - 16.0 gm/dl    Hematocrit 72.5 36.6 - 47.0 %    MCV 92.0 80.0 - 98.0 fL    MCH 30.6 25.4 - 34.6 pg    MCHC 33.2 30.0 - 36.0 gm/dl    Platelets 440 347 - 450 1000/mm3    MPV 8.4 6.0 - 10.0 fL    RDW 46.7 (H) 36.4 - 46.3      Nucleated RBCs 0 0 - 0      Immature Granulocytes % 0.4 0.0 - 3.0 %    Neutrophils Segmented 56.9 34 - 64 %    Lymphocytes 28.8 28 - 48 %    Monocytes 7.7 1 - 13 %    Eosinophils 4.9 0 - 5 %    Basophils 1.3 0 - 3 %   Comprehensive Metabolic Panel   Result Value Ref Range    Potassium 3.7 3.5 - 5.1 mEq/L    Chloride 93 (L) 98 - 107 mEq/L    Sodium 130 (L) 136 - 145 mEq/L    CO2 27 20 - 31 mEq/L    Glucose 92 74 - 106 mg/dl    BUN 10 9 - 23 mg/dl    Creatinine 4.25 (H) 0.55 - 1.02 mg/dl    GFR African American 44.0      GFR Non-African American 37      Calcium 9.8 8.7 - 10.4 mg/dl    Anion Gap 11 5 -  15 mmol/L    AST 25.0 0.0 - 33.9 U/L    ALT 12 10 - 49 U/L    Alkaline Phosphatase 98 46 - 116 U/L    Total Bilirubin 1.20 0.30 - 1.20 mg/dl    Total Protein 7.2 5.7 - 8.2 gm/dl    Albumin 4.0 3.4 - 5.0 gm/dl   proBNP, N-TERMINAL   Result Value Ref Range    NT Pro-BNP 505.0 (H) 0.0 - 125.0 pg/ml   TSH   Result Value Ref Range    TSH, High Sensitivity 0.745 0.550 - 4.780 uIU/mL   Ammonia   Result Value Ref Range    Ammonia 22.0 11.2 - 31.7 umol/L   POCT Urinalysis no Micro   Result Value Ref Range    Glucose, Ur Negative NEGATIVE,Negative mg/dl    Bilirubin, Urine Negative NEGATIVE,Negative      Ketones, Urine Negative NEGATIVE,Negative mg/dl    Specific Gravity, UA 1.010 1.005 - 1.030      Blood, Urine Negative NEGATIVE,Negative      pH, Urine 6.0 5 - 9      Protein,  Urine Negative NEGATIVE,Negative mg/dl    Urobilinogen, Urine 0.2 0.0 - 1.0 EU/dl    Nitrite, Urine Negative NEGATIVE,Negative      Leukocyte Esterase, Urine Negative NEGATIVE,Negative      Color, UA Yellow      Clarity, UA Clear     POC CHEM 8   Result Value Ref Range    Sodium 129 (L) 136 - 145 mEq/L    Potassium 4.6 3.5 - 4.9 mEq/L    Chloride 93 (L) 98 - 107 mEq/L    Total CO2 27 21 - 32 mmol/L    Glucose 95 74 - 106 mg/dL    BUN 12 7 - 25 mg/dl    Creatinine 1.6 (H) 0.6 - 1.3 mg/dl    Hematocrit 38 38 - 45 %    Hemoglobin 12.9 12.4 - 17.2 gm/dl    Calcium, Ionized 5.78 4.40 - 5.40 mg/dL        Medications   sodium chloride 0.9 % bolus 500 mL (0 mLs IntraVENous Stopped 06/14/23 1439)       Procedures    My interpretation of imaging shows CTH no ICH by my interpretation, CXR with no consolidation or pneumothorax by my interpretation    EKG shows paced rhythm with a rate of 60 without any significant ST segment changes by my interpretation    Telemetry sinus rhythm monitor by my interpretation    Medical Decision Making/ED Course        Medical Decision Making  Amount and/or Complexity of Data Reviewed  Labs: ordered.  Radiology: ordered.  ECG/medicine tests: ordered.    Risk  Prescription drug management.        RECORD REVIEW:  I reviewed the patient's previous records here at [facility] and available outside facilities and note that reviewed office visit on 11/06/2022    COMORBIDITIES impacting Evaluation and Management: see above    Severe exacerbation or progression of chronic illness:    Threat to body function without evaluation and management:      Social Determinants impacting Evaluation and Management:     Final Diagnosis       ICD-10-CM    1. Dementia, unspecified dementia severity, unspecified dementia type, unspecified whether behavioral, psychotic, or mood disturbance or anxiety (HCC)  F03.90       2. Hyponatremia  E87.1  Disposition   Home    Mikael Spray, MD  June 14, 2023  8:55 PM     *Portions  of this electronic record were dictated using Dragon voice recognition software.  Unintended errors in translation may occur.    My signature above authenticates this document and my orders, the final    diagnosis (es), discharge prescription (s), and instructions in the Epic    record.  If you have any questions please contact [phone].     Nursing notes have been reviewed by the physician/ advanced practice    Clinician.               Mikael Spray, MD  06/14/23 2118

## 2023-06-14 NOTE — ED Notes (Signed)
 Pt was escorted, by this Clinical research associate, to the restroom. Pt ambulated to restroom and back into stretcher with steady gait and denies any dizziness.      Marda Stalker, RN  06/14/23 [phone]

## 2023-06-15 LAB — EKG 12-LEAD
Atrial Rate: 60 {beats}/min
Calculated R Axis: 142 degrees
Calculated T Axis: 31 degrees
P-R Interval: 114 ms
Q-T Interval: 442 ms
QRS Duration: 108 ms
QTC Calculation (Bezet): 442 ms
Ventricular Rate: 60 {beats}/min

## 2023-06-17 ENCOUNTER — Encounter

## 2023-07-25 ENCOUNTER — Other Ambulatory Visit: Payer: Self-pay | Admitting: Family

## 2023-08-25 ENCOUNTER — Other Ambulatory Visit: Payer: Self-pay | Admitting: Family

## 2023-08-29 ENCOUNTER — Other Ambulatory Visit: Payer: Self-pay | Admitting: Family

## 2023-09-01 ENCOUNTER — Encounter: Payer: Self-pay | Admitting: Family

## 2023-09-01 ENCOUNTER — Ambulatory Visit (INDEPENDENT_AMBULATORY_CARE_PROVIDER_SITE_OTHER): Admitting: Family

## 2023-09-01 VITALS — BP 138/82 | HR 62 | Ht 66.75 in | Wt 120.8 lb

## 2023-09-01 DIAGNOSIS — Z1231 Encounter for screening mammogram for malignant neoplasm of breast: Secondary | ICD-10-CM

## 2023-09-01 DIAGNOSIS — I10 Essential (primary) hypertension: Secondary | ICD-10-CM

## 2023-09-01 MED ORDER — AMLODIPINE BESYLATE 2.5 MG PO TABS: 2.5000 mg | ORAL_TABLET | Freq: Every day | ORAL | 2 refills | Status: AC

## 2023-09-01 NOTE — Progress Notes (Signed)
 Mary Fitzpatrick is a 83 y.o. female with the following history as recorded in EpicCare:  Patient Active Problem List   Diagnosis Date Noted   S/P ORIF (open reduction internal fixation) fracture 01/15/2021   Hyperkalemia 12/20/2020   Primary hyperparathyroidism (HCC) 12/20/2020   Need for immunization against influenza 12/20/2020   Hypertension 07/20/2020   Hyperlipidemia 07/20/2020   Osteoporosis 07/20/2020   Tobacco abuse 07/20/2020    Current Outpatient Medications  Medication Sig Dispense Refill   alendronate  (FOSAMAX ) 70 MG tablet Take 1 tablet (70 mg total) by mouth every 7 (seven) days. Take with a full glass of water on an empty stomach. 4 tablet 11   atorvastatin  (LIPITOR) 20 MG tablet Take 1 tablet (20 mg total) by mouth every morning. 90 tablet 3   cholecalciferol (VITAMIN D3) 25 MCG (1000 UNIT) tablet Take 1,000 Units by mouth daily. Per patient taking 2,000 units daily     amLODipine  (NORVASC ) 2.5 MG tablet Take 1 tablet (2.5 mg total) by mouth daily. 90 tablet 2   No current facility-administered medications for this visit.    Allergies: Hctz Heracl[place] ]  Past Medical History:  Diagnosis Date   Arthritis    Hyperlipidemia    Hypertension    Parathyroid abnormality (HCC)    nodule on one of the glands    Past Surgical History:  Procedure Laterality Date   BREAST EXCISIONAL BIOPSY Left    BREAST EXCISIONAL BIOPSY Right    GASTRECTOMY     Due to a ruptured bleeding ulcer   LAPAROTOMY     exploratory kink in bowel due to water sking   ORIF HUMERUS FRACTURE Left 01/15/2021   Procedure: OPEN REDUCTION INTERNAL FIXATION (ORIF) PROXIMAL HUMERUS FRACTURE;  Surgeon: Ellard Gunning, MD;  Location: [facility] ORS;  Service: Orthopedics;  Laterality: Left;   TONSILLECTOMY      No family history on file.  Social History   Tobacco Use   Smoking status: Some Days    Types: Cigarettes   Smokeless tobacco: Never  Substance Use Topics   Alcohol use: Yes    Alcohol/week:  2.0 standard drinks of alcohol    Types: 2 Glasses of wine per week    Comment: most nights    Subjective:   6 month follow up on HTN; no acute concerns today; Denies any chest pain, shortness of breath, blurred vision or headache.   Objective:  Vitals:   09/01/23 1048  BP: 138/82  Pulse: 62  SpO2: 95%  Weight: 120 lb 12.8 oz (54.8 kg)  Height: 5' 6.75 (1.695 m)    General: Well developed, well nourished, in no acute distress  Skin : Warm and dry.  Head: Normocephalic and atraumatic  Eyes: Sclera and conjunctiva clear; pupils round and reactive to light; extraocular movements intact  Ears: External normal; canals clear; tympanic membranes normal  Oropharynx: Pink, supple. No suspicious lesions  Neck: Supple without thyromegaly, adenopathy  Lungs: Respirations unlabored; clear to auscultation bilaterally without wheeze, rales, rhonchi  CVS exam: normal rate and regular rhythm.  Neurologic: Alert and oriented; speech intact; face symmetrical; moves all extremities well; CNII-XII intact without focal deficit   Assessment:  1. Primary hypertension   2. Visit for screening mammogram     Plan:  Stable; patient's blood pressure runs higher in the office- she checks at home and consistently well controlled; refill updated; Order update;   Return in about 4 months (around 01/01/2024) for CPE.  Orders Placed This Encounter  Procedures   MM  Digital Screening    Standing Status:   Future    Expiration Date:   08/31/2024    Reason for Exam (SYMPTOM  OR DIAGNOSIS REQUIRED):   screening mammogram    Preferred imaging location?:   [facility]    Requested Prescriptions   Signed Prescriptions Disp Refills   amLODipine  (NORVASC ) 2.5 MG tablet 90 tablet 2    Sig: Take 1 tablet (2.5 mg total) by mouth daily.

## 2023-09-10 ENCOUNTER — Other Ambulatory Visit: Payer: Self-pay | Admitting: Family

## 2023-09-11 ENCOUNTER — Other Ambulatory Visit: Payer: Self-pay | Admitting: Family

## 2023-09-15 ENCOUNTER — Ambulatory Visit

## 2023-09-16 ENCOUNTER — Ambulatory Visit

## 2023-09-23 ENCOUNTER — Ambulatory Visit: Admitting: *Deleted

## 2023-09-23 VITALS — Ht 66.75 in | Wt 120.0 lb

## 2023-09-23 DIAGNOSIS — Z0000 Encounter for general adult medical examination without abnormal findings: Secondary | ICD-10-CM | POA: Diagnosis not present

## 2023-09-23 NOTE — Patient Instructions (Addendum)
 Mary Fitzpatrick , Thank you for taking time out of your busy schedule to complete your Annual Wellness Visit with me. I enjoyed our conversation and look forward to speaking with you again next year. I, as well as your care team,  appreciate your ongoing commitment to your health goals. Please review the following plan we discussed and let me know if I can assist you in the future.       Your Game plan/ To Do List     Follow up Visits: Next Medicare AWV with our clinical staff: 09/23/24  3pm.   Next Office Visit with your provider: 01/01/24  11am.  Clinician Recommendations:  Aim for 30 minutes of exercise or brisk walking, 6-8 glasses of water, and 5 servings of fruits and vegetables each day.   You will need to get the following vaccines at your local pharmacy: Pneumonia     This is a list of the screening recommended for you and due dates:  Health Maintenance  Topic Date Due   Pneumococcal Vaccine for age over 29 (1 of 2 - PCV) Never done   Medicare Annual Wellness Visit  07/23/2022   COVID-19 Vaccine (3 - 2024-25 season) 11/16/2022   Flu Shot  10/16/2023   DEXA scan (bone density measurement)  Completed   Hepatitis B Vaccine  Aged Out   HPV Vaccine  Aged Out   Meningitis B Vaccine  Aged Out   DTaP/Tdap/Td vaccine  Discontinued   Zoster (Shingles) Vaccine  Discontinued    Advanced directives: (Copy Requested) Please bring a copy of your health care power of attorney and living will to the office to be added to your chart at your convenience. You can mail to [facility] [address] [place], [place] [address] or email to [email or web address] or bring them in person at your next office visit.   See attachments for Preventive Care and Fall Prevention Tips.

## 2023-09-23 NOTE — Progress Notes (Signed)
 Please attest this visit in the absence of patient primary care provider.    Subjective:   Mary Fitzpatrick is a 83 y.o. who presents for a Medicare Wellness preventive visit.  As a reminder, Annual Wellness Visits don't include a physical exam, and some assessments may be limited, especially if this visit is performed virtually. We may recommend an in-person follow-up visit with your provider if needed.  Visit Complete: Virtual I connected with  Mary Fitzpatrick on 09/23/23 by a audio enabled telemedicine application and verified that I am speaking with the correct person using two identifiers.  Patient Location: Home  Provider Location: Home Office  I discussed the limitations of evaluation and management by telemedicine. The patient expressed understanding and agreed to proceed.  Vital Signs: Because this visit was a virtual/telehealth visit, some criteria may be missing or patient reported. Any vitals not documented were not able to be obtained and vitals that have been documented are patient reported.  VideoDeclined- This patient declined Librarian, academic. Therefore the visit was completed with audio only.  Persons Participating in Visit: Patient.  AWV Questionnaire: No: Patient Medicare AWV questionnaire was not completed prior to this visit.  Cardiac Risk Factors include: advanced age (>73men, >6 women);dyslipidemia;hypertension     Objective:    Today's Vitals   09/23/23 1417  Weight: 120 lb (54.4 kg)  Height: 5' 6.75 (1.695 m)   Body mass index is 18.94 kg/m.     08/15/2022   10:50 AM 07/22/2021   12:34 PM 04/02/2021   10:25 AM 01/15/2021    1:29 PM 01/14/2021    1:37 PM  Advanced Directives  Does Patient Have a Medical Advance Directive? No No No No No  Would patient like information on creating a medical advance directive? No - Patient declined Yes (MAU/Ambulatory/Procedural Areas - Information given) No - Patient declined No -  Patient declined No - Patient declined    Current Medications (verified) Outpatient Encounter Medications as of 09/23/2023  Medication Sig   alendronate  (FOSAMAX ) 70 MG tablet Take 1 tablet (70 mg total) by mouth once a week. Take with full glass of water on an empty stomach   amLODipine  (NORVASC ) 2.5 MG tablet Take 1 tablet (2.5 mg total) by mouth daily.   atorvastatin  (LIPITOR) 20 MG tablet Take 1 tablet (20 mg total) by mouth daily.   cholecalciferol (VITAMIN D3) 25 MCG (1000 UNIT) tablet Take 1,000 Units by mouth daily. Per patient taking 2,000 units daily   No facility-administered encounter medications on file as of 09/23/2023.    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   Past Surgical History:  Procedure Laterality Date   BREAST EXCISIONAL BIOPSY Left    BREAST EXCISIONAL BIOPSY Right    CATARACT EXTRACTION, BILATERAL Bilateral 2024   GASTRECTOMY     Due to a ruptured bleeding ulcer   LAPAROTOMY     exploratory kink in bowel due to water sking   ORIF HUMERUS FRACTURE Left 01/15/2021   Procedure: OPEN REDUCTION INTERNAL FIXATION (ORIF) PROXIMAL HUMERUS FRACTURE;  Surgeon: Melita Drivers, MD;  Location: [facility];  Service: Orthopedics;  Laterality: Left;   TONSILLECTOMY     Family History  Problem Relation Age of Onset   Cancer Brother        appendix ?primary source  Social History   Socioeconomic History   Marital status: Widowed    Spouse name: Not on file   Number of children: Not on file   Years of education: Not on file   Highest education level: Associate degree: occupational, Scientist, product/process development, or vocational program  Occupational History   Not on file  Tobacco Use   Smoking status: Some Days    Types: Cigarettes   Smokeless tobacco: Never  Vaping Use   Vaping status: Never Used  Substance and  Sexual Activity   Alcohol use: Yes    Alcohol/week: 2.0 standard drinks of alcohol    Types: 2 Glasses of wine per week    Comment: most nights, 3-4 times week   Drug use: Not Currently   Sexual activity: Not Currently  Other Topics Concern   Not on file  Social History Narrative   Not on file   Social Drivers of Health   Financial Resource Strain: Low Risk  (07/22/2021)   Overall Financial Resource Strain (CARDIA)    Difficulty of Paying Living Expenses: Not hard at all  Food Insecurity: No Food Insecurity (09/23/2023)   Hunger Vital Sign    Worried About Running Out of Food in the Last Year: Never true    Ran Out of Food in the Last Year: Never true  Transportation Needs: No Transportation Needs (09/23/2023)   PRAPARE - Administrator, Civil Service (Medical): No    Lack of Transportation (Non-Medical): No  Physical Activity: Sufficiently Active (07/22/2021)   Exercise Vital Sign    Days of Exercise per Week: 7 days    Minutes of Exercise per Session: 40 min  Stress: No Stress Concern Present (07/22/2021)   [employer] of Occupational Health - Occupational Stress Questionnaire    Feeling of Stress : Not at all  Social Connections: Unknown (09/23/2023)   Social Connection and Isolation Panel    Frequency of Communication with Friends and Family: More than three times a week    Frequency of Social Gatherings with Friends and Family: Twice a week    Attends Religious Services: Not on Marketing executive or Organizations: Not on file    Attends Banker Meetings: Not on file    Marital Status: Not on file    Tobacco Counseling Ready to quit: Not Answered Counseling given: Not Answered    Clinical Intake:  Pre-visit preparation completed: Yes  Pain : No/denies pain     BMI - recorded: 18.94 Nutritional Status: BMI <19  Underweight Nutritional Risks: None Diabetes: No  No results found for: HGBA1C   How often do you need to have  someone help you when you read instructions, pamphlets, or other written materials from your doctor or pharmacy?: 1 - Never What is the last grade level you completed in school?: associate's degree  Interpreter Needed?: No  Information entered by :: Lolita Libra, CMA   Activities of Daily Living     09/23/2023    2:30 PM  In your present state of health, do you have any difficulty performing the following activities:  Hearing? 0  Vision? 0  Difficulty concentrating or making decisions? 0  Walking or climbing stairs? 0  Dressing or bathing? 0  Doing errands, shopping? 0  Preparing Food and eating ? N  Using the Toilet? N  In the past six months, have you accidently leaked urine? Y  Comment Must go when first gets the urge,currently manageable  Do  you have problems with loss of bowel control? N  Managing your Medications? N  Managing your Finances? N  Housekeeping or managing your Housekeeping? N    Patient Care Team: Jason Leita Repine, FNP as PCP - General (Internal Medicine) Lonni Slain, MD as PCP - Cardiology (Cardiology) [place], [facility]  I have updated your Care Teams any recent Medical Services you may have received from other providers in the past year.     Assessment:   This is a routine wellness examination for [place].  Hearing/Vision screen Hearing Screening - Comments:: Denies hearing difficulties.  Vision Screening - Comments:: Last eye exam 09/22/23 with  Dr Janit at [facility] in [facility].    Goals Addressed   None    Depression Screen     09/01/2023   10:59 AM 07/08/2022    1:10 PM 01/10/2022    1:25 PM 07/22/2021   12:42 PM 07/11/2021    1:00 PM 10/26/2020    9:21 AM 07/20/2020    9:55 AM  PHQ 2/9 Scores  PHQ - 2 Score 0 0 0 0 0 0 0    Fall Risk     09/23/2023    2:29 PM 07/08/2022    1:10 PM 01/10/2022    1:25 PM 07/22/2021   12:39 PM 07/11/2021    1:00 PM  Fall Risk   Falls in the past year? 0 0 0 1 1   Number falls in past yr: 0 0 0 0 0  Injury with Fall? 0 0 0 1 1  Risk for fall due to : No Fall Risks No Fall Risks No Fall Risks History of fall(s) Impaired balance/gait;History of fall(s)  Follow up Falls evaluation completed Falls evaluation completed Falls evaluation completed  Falls prevention discussed  Falls evaluation completed;Falls prevention discussed      Data saved with a previous flowsheet row definition    MEDICARE RISK AT HOME:  Medicare Risk at Home Any stairs in or around the home?: Yes If so, are there any without handrails?: No Home free of loose throw rugs in walkways, pet beds, electrical cords, etc?: Yes Adequate lighting in your home to reduce risk of falls?: Yes Life alert?: Yes (has smart watch that will notify family) Use of a cane, walker or w/c?: No Grab bars in the bathroom?: No Shower chair or bench in shower?: Yes Elevated toilet seat or a handicapped toilet?: Yes  TIMED UP AND GO:  Was the test performed?  No  Cognitive Function: 6CIT completed        Immunizations Immunization History  Administered Date(s) Administered   Fluad Quad(high Dose 65+) 01/01/2017, 12/18/2020, 01/10/2022   Fluad Trivalent(High Dose 65+) 01/06/2023   Influenza, High Dose Seasonal PF 01/11/2013, 01/09/2014, 01/19/2015, 12/27/2015, 01/28/2018, 12/21/2018, 01/16/2020   Moderna Sars-Covid-2 Vaccination 04/04/2019, 05/02/2019   Tdap 08/15/2022    Screening Tests Health Maintenance  Topic Date Due   Pneumococcal Vaccine: 50+ Years (1 of 2 - PCV) Never done   Medicare Annual Wellness (AWV)  07/23/2022   COVID-19 Vaccine (3 - 2024-25 season) 11/16/2022   INFLUENZA VACCINE  10/16/2023   DEXA SCAN  Completed   Hepatitis B Vaccines  Aged Out   HPV VACCINES  Aged Out   Meningococcal B Vaccine  Aged Out   DTaP/Tdap/Td  Discontinued   Zoster Vaccines- Shingrix  Discontinued    Health Maintenance  Health Maintenance Due  Topic Date Due   Pneumococcal Vaccine: 50+  Years (1 of  2 - PCV) Never done   Medicare Annual Wellness (AWV)  07/23/2022   COVID-19 Vaccine (3 - 2024-25 season) 11/16/2022   Health Maintenance Items Addressed: Pt will get at local pharmacy.  Additional Screening:  Vision Screening: Recommended annual ophthalmology exams for early detection of glaucoma and other disorders of the eye. Would you like a referral to an eye doctor? No    Dental Screening: Recommended annual dental exams for proper oral hygiene  Community Resource Referral / Chronic Care Management: CRR required this visit?  No   CCM required this visit?  No   Plan:    I have personally reviewed and noted the following in the patient's chart:   Medical and social history Use of alcohol, tobacco or illicit drugs  Current medications and supplements including opioid prescriptions. Patient is not currently taking opioid prescriptions. Functional ability and status Nutritional status Physical activity Advanced directives List of other physicians Hospitalizations, surgeries, and ER visits in previous 12 months Vitals Screenings to include cognitive, depression, and falls Referrals and appointments  In addition, I have reviewed and discussed with patient certain preventive protocols, quality metrics, and best practice recommendations. A written personalized care plan for preventive services as well as general preventive health recommendations were provided to patient.   Lolita Libra, CMA   09/23/2023   After Visit Summary: (MyChart) Due to this being a telephonic visit, the after visit summary with patients personalized plan was offered to patient via MyChart   Notes: Nothing significant to report at this time.

## 2023-09-25 NOTE — Progress Notes (Signed)
 Please attest this visit in the absence of patient primary care provider.    Subjective:   Mary Fitzpatrick is a 83 y.o. who presents for a Medicare Wellness preventive visit.  As a reminder, Annual Wellness Visits don't include a physical exam, and some assessments may be limited, especially if this visit is performed virtually. We may recommend an in-person follow-up visit with your provider if needed.  Visit Complete: Virtual I connected with  Mary Fitzpatrick on 09/23/23 by a audio enabled telemedicine application and verified that I am speaking with the correct person using two identifiers.  Patient Location: Home  Provider Location: Home Office  I discussed the limitations of evaluation and management by telemedicine. The patient expressed understanding and agreed to proceed.  Vital Signs: Because this visit was a virtual/telehealth visit, some criteria may be missing or patient reported. Any vitals not documented were not able to be obtained and vitals that have been documented are patient reported.  VideoDeclined- This patient declined Librarian, academic. Therefore the visit was completed with audio only.  Persons Participating in Visit: Patient.  AWV Questionnaire: No: Patient Medicare AWV questionnaire was not completed prior to this visit.  Cardiac Risk Factors include: advanced age (>81men, >16 women);dyslipidemia;hypertension     Objective:    Today's Vitals   09/23/23 1417  Weight: 120 lb (54.4 kg)  Height: 5' 6.75 (1.695 m)   Body mass index is 18.94 kg/m.     09/23/2023    2:40 PM 08/15/2022   10:50 AM 07/22/2021   12:34 PM 04/02/2021   10:25 AM 01/15/2021    1:29 PM 01/14/2021    1:37 PM  Advanced Directives  Does Patient Have a Medical Advance Directive? Yes No No No No No  Type of Estate agent of [place];Living will       Does patient want to make changes to medical advance directive? No - Patient declined        Copy of Healthcare Power of Attorney in Chart? No - copy requested       Would patient like information on creating a medical advance directive?  No - Patient declined Yes (MAU/Ambulatory/Procedural Areas - Information given) No - Patient declined No - Patient declined No - Patient declined    Current Medications (verified) Outpatient Encounter Medications as of 09/23/2023  Medication Sig   alendronate  (FOSAMAX ) 70 MG tablet Take 1 tablet (70 mg total) by mouth once a week. Take with full glass of water on an empty stomach   amLODipine  (NORVASC ) 2.5 MG tablet Take 1 tablet (2.5 mg total) by mouth daily.   atorvastatin  (LIPITOR) 20 MG tablet Take 1 tablet (20 mg total) by mouth daily.   cholecalciferol (VITAMIN D3) 25 MCG (1000 UNIT) tablet Take 1,000 Units by mouth daily. Per patient taking 2,000 units daily   No facility-administered encounter medications on file as of 09/23/2023.    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   Past Surgical History:  Procedure Laterality Date   BREAST EXCISIONAL BIOPSY Left    BREAST EXCISIONAL BIOPSY Right    CATARACT EXTRACTION, BILATERAL Bilateral 2024   GASTRECTOMY     Due to a ruptured bleeding ulcer   LAPAROTOMY     exploratory kink in bowel due to water sking  ORIF HUMERUS FRACTURE Left 01/15/2021   Procedure: OPEN REDUCTION INTERNAL FIXATION (ORIF) PROXIMAL HUMERUS FRACTURE;  Surgeon: Melita Drivers, MD;  Location: [facility];  Service: Orthopedics;  Laterality: Left;   TONSILLECTOMY     Family History  Problem Relation Age of Onset   Cancer Brother        appendix ?primary source   Social History   Socioeconomic History   Marital status: Widowed    Spouse name: Not on file   Number of children: Not on file   Years of education: Not on file   Highest education  level: Associate degree: occupational, Scientist, product/process development, or vocational program  Occupational History   Not on file  Tobacco Use   Smoking status: Some Days    Types: Cigarettes   Smokeless tobacco: Never  Vaping Use   Vaping status: Never Used  Substance and Sexual Activity   Alcohol use: Yes    Alcohol/week: 2.0 standard drinks of alcohol    Types: 2 Glasses of wine per week    Comment: most nights, 3-4 times week   Drug use: Not Currently   Sexual activity: Not Currently  Other Topics Concern   Not on file  Social History Narrative   Not on file   Social Drivers of Health   Financial Resource Strain: Low Risk  (09/23/2023)   Overall Financial Resource Strain (CARDIA)    Difficulty of Paying Living Expenses: Not very hard  Food Insecurity: No Food Insecurity (09/23/2023)   Hunger Vital Sign    Worried About Running Out of Food in the Last Year: Never true    Ran Out of Food in the Last Year: Never true  Transportation Needs: No Transportation Needs (09/23/2023)   PRAPARE - Administrator, Civil Service (Medical): No    Lack of Transportation (Non-Medical): No  Physical Activity: Sufficiently Active (09/23/2023)   Exercise Vital Sign    Days of Exercise per Week: 7 days    Minutes of Exercise per Session: 30 min  Stress: No Stress Concern Present (09/23/2023)   [employer] of Occupational Health - Occupational Stress Questionnaire    Feeling of Stress: Only a little  Social Connections: Moderately Isolated (09/23/2023)   Social Connection and Isolation Panel    Frequency of Communication with Friends and Family: More than three times a week    Frequency of Social Gatherings with Friends and Family: Once a week    Attends Religious Services: More than 4 times per year    Active Member of [employer] or Organizations: No    Attends Banker Meetings: Never    Marital Status: Widowed    Tobacco Counseling Ready to quit: Not Answered Counseling given: Not  Answered    Clinical Intake:  Pre-visit preparation completed: Yes  Pain : No/denies pain     BMI - recorded: 18.94 Nutritional Status: BMI <19  Underweight Nutritional Risks: None Diabetes: No  No results found for: HGBA1C   How often do you need to have someone help you when you read instructions, pamphlets, or other written materials from your doctor or pharmacy?: 1 - Never What is the last grade level you completed in school?: associate's degree  Interpreter Needed?: No  Information entered by :: Lolita Libra, CMA   Activities of Daily Living     09/23/2023    2:30 PM  In your present state of health, do you have any difficulty performing the following activities:  Hearing? 0  Vision? 0  Difficulty concentrating or making decisions? 0  Walking or climbing stairs? 0  Dressing or bathing? 0  Doing errands, shopping? 0  Preparing Food and eating ? N  Using the Toilet? N  In the past six months, have you accidently leaked urine? Y  Comment Must go when first gets the urge,currently manageable  Do you have problems with loss of bowel control? N  Managing your Medications? N  Managing your Finances? N  Housekeeping or managing your Housekeeping? N    Patient Care Team: Jason Leita Repine, FNP as PCP - General (Internal Medicine) Lonni Slain, MD as PCP - Cardiology (Cardiology) [place], [facility]  I have updated your Care Teams any recent Medical Services you may have received from other providers in the past year.     Assessment:   This is a routine wellness examination for [place].  Hearing/Vision screen Hearing Screening - Comments:: Denies hearing difficulties.  Vision Screening - Comments:: Last eye exam 09/22/23 with  Dr Janit at [facility] in [facility].    Goals Addressed   None    Depression Screen     09/01/2023   10:59 AM 07/08/2022    1:10 PM 01/10/2022    1:25 PM 07/22/2021   12:42 PM 07/11/2021    1:00  PM 10/26/2020    9:21 AM 07/20/2020    9:55 AM  PHQ 2/9 Scores  PHQ - 2 Score 0 0 0 0 0 0 0   No changes since screening done on 09/01/23.   Fall Risk     09/23/2023    2:29 PM 07/08/2022    1:10 PM 01/10/2022    1:25 PM 07/22/2021   12:39 PM 07/11/2021    1:00 PM  Fall Risk   Falls in the past year? 0 0 0 1 1  Number falls in past yr: 0 0 0 0 0  Injury with Fall? 0 0 0 1 1  Risk for fall due to : No Fall Risks No Fall Risks No Fall Risks History of fall(s) Impaired balance/gait;History of fall(s)  Follow up Falls evaluation completed Falls evaluation completed Falls evaluation completed  Falls prevention discussed  Falls evaluation completed;Falls prevention discussed      Data saved with a previous flowsheet row definition    MEDICARE RISK AT HOME:  Medicare Risk at Home Any stairs in or around the home?: Yes If so, are there any without handrails?: No Home free of loose throw rugs in walkways, pet beds, electrical cords, etc?: Yes Adequate lighting in your home to reduce risk of falls?: Yes Life alert?: Yes (has smart watch that will notify family) Use of a cane, walker or w/c?: No Grab bars in the bathroom?: No Shower chair or bench in shower?: Yes Elevated toilet seat or a handicapped toilet?: Yes  TIMED UP AND GO:  Was the test performed?  No  Cognitive Function: 6CIT completed        09/23/2023    2:41 PM  6CIT Screen  What Year? 0 points  What month? 0 points  What time? 0 points  Count back from 20 0 points  Months in reverse 0 points  Repeat phrase 0 points  Total Score 0 points    Immunizations Immunization History  Administered Date(s) Administered   Fluad Quad(high Dose 65+) 01/01/2017, 12/18/2020, 01/10/2022   Fluad Trivalent(High Dose 65+) 01/06/2023   Influenza, High Dose Seasonal PF 01/11/2013, 01/09/2014, 01/19/2015, 12/27/2015, 01/28/2018, 12/21/2018, 01/16/2020   Moderna Sars-Covid-2 Vaccination  04/04/2019, 05/02/2019   Tdap 08/15/2022     Screening Tests Health Maintenance  Topic Date Due   Pneumococcal Vaccine: 50+ Years (1 of 2 - PCV) Never done   COVID-19 Vaccine (3 - 2024-25 season) 09/22/2024 (Originally 11/16/2022)   INFLUENZA VACCINE  10/16/2023   Medicare Annual Wellness (AWV)  09/22/2024   DEXA SCAN  Completed   Hepatitis B Vaccines  Aged Out   HPV VACCINES  Aged Out   Meningococcal B Vaccine  Aged Out   DTaP/Tdap/Td  Discontinued   Zoster Vaccines- Shingrix  Discontinued    Health Maintenance  Health Maintenance Due  Topic Date Due   Pneumococcal Vaccine: 50+ Years (1 of 2 - PCV) Never done   Health Maintenance Items Addressed: Pt will get at local pharmacy.  Additional Screening:  Vision Screening: Recommended annual ophthalmology exams for early detection of glaucoma and other disorders of the eye. Would you like a referral to an eye doctor? No    Dental Screening: Recommended annual dental exams for proper oral hygiene  Community Resource Referral / Chronic Care Management: CRR required this visit?  No   CCM required this visit?  No   Plan:    I have personally reviewed and noted the following in the patient's chart:   Medical and social history Use of alcohol, tobacco or illicit drugs  Current medications and supplements including opioid prescriptions. Patient is not currently taking opioid prescriptions. Functional ability and status Nutritional status Physical activity Advanced directives List of other physicians Hospitalizations, surgeries, and ER visits in previous 12 months Vitals Screenings to include cognitive, depression, and falls Referrals and appointments  In addition, I have reviewed and discussed with patient certain preventive protocols, quality metrics, and best practice recommendations. A written personalized care plan for preventive services as well as general preventive health recommendations were provided to patient.   Lolita Libra, CMA   09/25/2023    After Visit Summary: (MyChart) Due to this being a telephonic visit, the after visit summary with patients personalized plan was offered to patient via MyChart   Notes: Nothing significant to report at this time.

## 2023-10-12 ENCOUNTER — Encounter (HOSPITAL_BASED_OUTPATIENT_CLINIC_OR_DEPARTMENT_OTHER): Payer: Self-pay

## 2023-10-12 ENCOUNTER — Ambulatory Visit (HOSPITAL_BASED_OUTPATIENT_CLINIC_OR_DEPARTMENT_OTHER)
Admission: RE | Admit: 2023-10-12 | Discharge: 2023-10-12 | Disposition: A | Discharge status: Home / Self Care | Source: Ambulatory Visit | Attending: Family | Admitting: Family

## 2023-10-12 DIAGNOSIS — Z1231 Encounter for screening mammogram for malignant neoplasm of breast: Secondary | ICD-10-CM | POA: Diagnosis present

## 2023-12-12 ENCOUNTER — Encounter

## 2023-12-21 ENCOUNTER — Inpatient Hospital Stay: Admit: 2023-12-21 | Payer: Medicare (Managed Care) | Primary: Internal Medicine

## 2023-12-21 DIAGNOSIS — N1832 Chronic kidney disease, stage 3b (HCC): Principal | ICD-10-CM

## 2024-01-01 ENCOUNTER — Ambulatory Visit: Admitting: Family

## 2024-01-01 ENCOUNTER — Ambulatory Visit: Payer: Self-pay | Admitting: Family

## 2024-01-01 ENCOUNTER — Encounter: Payer: Self-pay | Admitting: Family

## 2024-01-01 VITALS — BP 149/79 | HR 77 | Temp 97.5°F | Resp 16 | Ht 66.5 in | Wt 122.2 lb

## 2024-01-01 DIAGNOSIS — Z13220 Encounter for screening for lipoid disorders: Secondary | ICD-10-CM

## 2024-01-01 DIAGNOSIS — E782 Mixed hyperlipidemia: Secondary | ICD-10-CM | POA: Diagnosis not present

## 2024-01-01 DIAGNOSIS — L989 Disorder of the skin and subcutaneous tissue, unspecified: Secondary | ICD-10-CM

## 2024-01-01 DIAGNOSIS — E215 Disorder of parathyroid gland, unspecified: Secondary | ICD-10-CM

## 2024-01-01 DIAGNOSIS — I10 Essential (primary) hypertension: Secondary | ICD-10-CM | POA: Diagnosis not present

## 2024-01-01 DIAGNOSIS — Z23 Encounter for immunization: Secondary | ICD-10-CM

## 2024-01-01 DIAGNOSIS — M810 Age-related osteoporosis without current pathological fracture: Secondary | ICD-10-CM

## 2024-01-01 LAB — BASIC METABOLIC PANEL WITH GFR
BUN: 22 mg/dL (ref 6–23)
CO2: 27 meq/L (ref 19–32)
Calcium: 10 mg/dL (ref 8.4–10.5)
Chloride: 105 meq/L (ref 96–112)
Creatinine, Ser: 0.86 mg/dL (ref 0.40–1.20)
GFR: 62.75 mL/min (ref >60.00)
Glucose, Bld: 90 mg/dL (ref 70–99)
Potassium: 3.9 meq/L (ref 3.5–5.1)
Sodium: 141 meq/L (ref 135–145)

## 2024-01-01 LAB — LIPID PANEL
Cholesterol: 192 mg/dL (ref 0–200)
HDL: 110.9 mg/dL (ref >39.00)
LDL Cholesterol: 69 mg/dL (ref 0–99)
NonHDL: 80.99
Total CHOL/HDL Ratio: 2
Triglycerides: 61 mg/dL (ref 0.0–149.0)
VLDL: 12.2 mg/dL (ref 0.0–40.0)

## 2024-01-01 NOTE — Patient Instructions (Signed)
 VISIT SUMMARY:  During your visit, we discussed your concerns about skin lesions, managed your hypertension, osteoporosis, and high cholesterol, and planned for further evaluation of your high calcium  levels. You also received a flu shot.  YOUR PLAN:  SKIN LESIONS: You have a mole on your face that has grown, a spot on your leg that sometimes stings, and a small place inside your nose resembling a blood vessel. -We are referring you to [facility] for further evaluation of these skin lesions.  HYPERTENSION: Your blood pressure is higher in the office than at home and is managed with amlodipine  2.5 mg. -Please check your blood pressure at home on the day of your office visits and bring the readings with you to your appointments.  OSTEOPOROSIS: You are on your second round of Fosamax , and your bone density has decreased significantly between 2022 and 2024. -We will check your bone density again in the summer of 2026. -If there is further bone loss, we may consider switching your treatment to Prolia.  HYPERCALCEMIA: You have high calcium  levels and are being evaluated for a possible parathyroid disorder. -Continue your follow-up with the endocrinologist in early November for further blood work.  HYPERLIPIDEMIA: Your cholesterol levels were good a year and a half ago and are managed with Lipitor. -We will order a lipid panel to reassess your cholesterol levels. -We will also check your kidney function.

## 2024-01-01 NOTE — Assessment & Plan Note (Signed)
  Lesion on leg suspicious for skin cancer. Lesion on face has grown. Lesion inside nose noted. - Refer to [facility] for evaluation of skin lesions.

## 2024-01-01 NOTE — Assessment & Plan Note (Signed)
  Blood pressure mildly elevated in office today. States usually better at home. Managed with amlodipine  2.5 mg. - Advise to check blood pressure at home on the day of office visits and bring readings to appointments. - Continue to monitor bp at home regularly.  BP Readings from Last 3 Encounters:  01/01/24 (!) 149/79  09/01/23 138/82  01/06/23 (!) 146/92

## 2024-01-01 NOTE — Assessment & Plan Note (Signed)
  On second round of Fosamax . Bone density decreased significantly between 2022 and 2024. Discussed potential switch to Prolia if further bone loss observed. - Plan to check bone density in summer 2026. - Consider switching to Prolia if further bone loss is observed.

## 2024-01-01 NOTE — Progress Notes (Signed)
 Subjective:     Patient ID: Mary Fitzpatrick, female    DOB: 1940-06-04, 83 y.o.   MRN: [id number]  Chief Complaint  Patient presents with   Transitions Of Care    HPI  Discussed the use of AI scribe software for clinical note transcription with the patient, who gave verbal consent to proceed.  History of Present Illness  Mary Fitzpatrick is an 83 year old female who presents for transfer of care visit.   She is concerned about a mole on her face that has increased in size over the past year and a half, a spot on her right leg that sometimes stings, and a small place inside her nose resembling a blood vessel. She is interested in seeing a dermatologist.   Hypertension is managed with amlodipine  2.5 mg. Blood pressure is typically higher in clinical settings than at home. Osteoporosis is treated with Fosamax , currently in her second round for about two years. Previously on Fosamax  for 5 years first round. High cholesterol is managed with Lipitor, with a lipid panel conducted a year and a half ago reported as good.  She has high calcium  levels and is scheduled to see an endocrinologist in early November for possible hyperparathyroidism. She avoids calcium  supplements but includes dairy in her diet.  She is interested in receiving a flu shot during this visit. Will consider prevnar next visit.      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    Past Surgical History:  Procedure Laterality Date   BREAST EXCISIONAL BIOPSY Left    BREAST EXCISIONAL BIOPSY Right    CATARACT EXTRACTION, BILATERAL Bilateral 2024   GASTRECTOMY     Due to a ruptured bleeding ulcer   LAPAROTOMY     exploratory kink  in bowel due to water sking   ORIF HUMERUS FRACTURE Left 01/15/2021   Procedure: OPEN REDUCTION INTERNAL FIXATION (ORIF) PROXIMAL HUMERUS FRACTURE;  Surgeon: Melita Drivers, MD;  Location: [facility];  Service: Orthopedics;  Laterality: Left;   TONSILLECTOMY      Family History  Problem Relation Age of Onset   Cancer Brother        appendix ?primary source    Social History   Socioeconomic History   Marital status: Widowed    Spouse name: Not on file   Number of children: Not on file   Years of education: Not on file   Highest education level: Associate degree: occupational, Scientist, product/process development, or vocational program  Occupational History   Not on file  Tobacco Use   Smoking status: Some Days    Types: Cigarettes   Smokeless tobacco: Never  Vaping Use   Vaping status: Never Used  Substance and Sexual Activity   Alcohol use: Yes    Alcohol/week: 2.0 standard drinks of alcohol    Types: 2 Glasses of wine per week    Comment: most nights, 3-4 times week   Drug use: Not Currently   Sexual activity: Not Currently  Other Topics Concern   Not on file  Social History Narrative   Widow   One son in [place] 2 grandchildren   Retired Print production planner  Enjoys Moved her from [facility]   Enjoys bridge, travel   Social Drivers of Health   Financial Resource Strain: Low Risk  (09/23/2023)   Overall Financial Resource Strain (CARDIA)    Difficulty of Paying Living Expenses: Not very hard  Food Insecurity: No Food Insecurity (09/23/2023)   Hunger Vital Sign    Worried About Running Out of Food in the Last Year: Never true    Ran Out of Food in the Last Year: Never true  Transportation Needs: No Transportation Needs (09/23/2023)   PRAPARE - Administrator, Civil Service (Medical): No    Lack of Transportation (Non-Medical): No  Physical Activity: Sufficiently Active (09/23/2023)   Exercise Vital Sign    Days of Exercise per Week: 7 days    Minutes of Exercise per Session: 30 min   Stress: No Stress Concern Present (09/23/2023)   [employer] of Occupational Health - Occupational Stress Questionnaire    Feeling of Stress: Only a little  Social Connections: Moderately Isolated (09/23/2023)   Social Connection and Isolation Panel    Frequency of Communication with Friends and Family: More than three times a week    Frequency of Social Gatherings with Friends and Family: Once a week    Attends Religious Services: More than 4 times per year    Active Member of [employer] or Organizations: No    Attends Banker Meetings: Never    Marital Status: Widowed  Intimate Partner Violence: Not At Risk (09/23/2023)   Humiliation, Afraid, Rape, and Kick questionnaire    Fear of Current or Ex-Partner: No    Emotionally Abused: No    Physically Abused: No    Sexually Abused: No    Outpatient Medications Prior to Visit  Medication Sig Dispense Refill   alendronate  (FOSAMAX ) 70 MG tablet Take 1 tablet (70 mg total) by mouth once a week. Take with full glass of water on an empty stomach 12 tablet 3   amLODipine  (NORVASC ) 2.5 MG tablet Take 1 tablet (2.5 mg total) by mouth daily. 90 tablet 2   atorvastatin  (LIPITOR) 20 MG tablet Take 1 tablet (20 mg total) by mouth daily. 90 tablet 1   cholecalciferol (VITAMIN D3) 25 MCG (1000 UNIT) tablet Take 1,000 Units by mouth daily. Per patient taking 2,000 units daily     No facility-administered medications prior to visit.    Allergies  Allergen Reactions   Hctz [Hydrochlorothiazide ] Other (See Comments)    Elevated calcium     ROS    See HPI Objective:    Physical Exam Constitutional:      General: She is not in acute distress.    Appearance: Normal appearance. She is well-developed.  HENT:     Head: Normocephalic and atraumatic.     Right Ear: External ear normal.     Left Ear: External ear normal.  Eyes:     General: No scleral icterus. Neck:     Thyroid : No thyromegaly.  Cardiovascular:     Rate and Rhythm:  Normal rate and regular rhythm.     Heart sounds: Normal heart sounds. No murmur heard. Pulmonary:     Effort: Pulmonary effort is normal. No respiratory distress.     Breath sounds: Normal breath sounds. No wheezing.  Musculoskeletal:     Cervical back: Neck supple.  Skin:    General: Skin is warm and dry.     Comments: Smooth flesh colored mole adjacent to right nare Dry scaling lesion right  inner shin  Neurological:     Mental Status: She is alert and oriented to person, place, and time.  Psychiatric:        Mood and Affect: Mood normal.        Behavior: Behavior normal.        Thought Content: Thought content normal.        Judgment: Judgment normal.       BP (!) 149/79 (Patient Position: Sitting)   Pulse 77   Temp (!) 97.5 F (36.4 C) (Oral)   Resp 16   Ht 5' 6.5 (1.689 m)   Wt 122 lb 3.2 oz (55.4 kg)   SpO2 100%   BMI 19.43 kg/m  Wt Readings from Last 3 Encounters:  01/01/24 122 lb 3.2 oz (55.4 kg)  09/23/23 120 lb (54.4 kg)  09/01/23 120 lb 12.8 oz (54.8 kg)       Assessment & Plan:   Problem List Items Addressed This Visit       Unprioritized   Skin lesion    Lesion on leg suspicious for skin cancer. Lesion on face has grown. Lesion inside nose noted. - Refer to [facility] for evaluation of skin lesions.      Relevant Orders   Ambulatory referral to Dermatology   Parathyroid abnormality    Chronic elevated calcium  levels. Under evaluation by endocrinologist. Potential contribution to osteoporosis. - Continue follow-up with endocrinologist in early November for blood work.      Osteoporosis    On second round of Fosamax . Bone density decreased significantly between 2022 and 2024. Discussed potential switch to Prolia if further bone loss observed. - Plan to check bone density in summer 2026. - Consider switching to Prolia if further bone loss is observed.      Hypertension    Blood pressure mildly elevated in office today. States  usually better at home. Managed with amlodipine  2.5 mg. - Advise to check blood pressure at home on the day of office visits and bring readings to appointments. - Continue to monitor bp at home regularly.  BP Readings from Last 3 Encounters:  01/01/24 (!) 149/79  09/01/23 138/82  01/06/23 (!) 146/92         Relevant Orders   Basic Metabolic Panel (BMET)   Hyperlipidemia   Lab Results  Component Value Date   CHOL 192 07/08/2022   HDL 102.00 07/08/2022   LDLCALC 79 07/08/2022   TRIG 57.0 07/08/2022   CHOLHDL 2 07/08/2022   Last lipid panel at goal on atorvastatin . Update lipid panel today.       Other Visit Diagnoses       Needs flu shot    -  Primary   Relevant Orders   Flu vaccine HIGH DOSE PF(Fluzone Trivalent)     Lipid screening       Relevant Orders   Lipid panel       I am having Elveria Bihari maintain her cholecalciferol, amLODipine , atorvastatin , and alendronate .  No orders of the defined types were placed in this encounter.

## 2024-01-01 NOTE — Assessment & Plan Note (Signed)
  Chronic elevated calcium  levels. Under evaluation by endocrinologist. Potential contribution to osteoporosis. - Continue follow-up with endocrinologist in early November for blood work.

## 2024-01-01 NOTE — Assessment & Plan Note (Addendum)
 Lab Results  Component Value Date   CHOL 192 07/08/2022   HDL 102.00 07/08/2022   LDLCALC 79 07/08/2022   TRIG 57.0 07/08/2022   CHOLHDL 2 07/08/2022   Last lipid panel at goal on atorvastatin . Update lipid panel today.

## 2024-01-11 ENCOUNTER — Encounter: Payer: Self-pay | Admitting: Family

## 2024-02-04 ENCOUNTER — Encounter

## 2024-02-04 ENCOUNTER — Inpatient Hospital Stay: Admit: 2024-02-04 | Payer: Medicare (Managed Care) | Primary: Internal Medicine

## 2024-02-04 DIAGNOSIS — N184 Chronic kidney disease, stage 4 (severe): Principal | ICD-10-CM

## 2024-02-04 DIAGNOSIS — E1122 Type 2 diabetes mellitus with diabetic chronic kidney disease: Secondary | ICD-10-CM

## 2024-02-04 LAB — CBC WITH AUTO DIFFERENTIAL
Basophils %: 0.6 % (ref 0.0–2.0)
Basophils Absolute: 0.03 K/UL (ref 0.00–0.10)
Hemoglobin: 11.1 g/dL — ABNORMAL LOW (ref 12.0–16.0)
Immature Granulocytes %: 0.4 % (ref 0.0–0.5)
Immature Granulocytes Absolute: 0.02 K/UL (ref 0.00–0.04)
Lymphocytes %: 24.7 % (ref 21.0–52.0)
Lymphocytes Absolute: 1.21 K/UL (ref 0.90–3.60)
MCHC: 33.1 g/dL (ref 31.0–37.0)
Monocytes %: 9 % (ref 3.0–10.0)
Monocytes Absolute: 0.44 K/UL (ref 0.05–1.20)
Neutrophils Absolute: 2.99 K/UL (ref 1.80–8.00)
Nucleated RBCs: 0 /100{WBCs}
Platelets: 290 K/uL (ref 135–420)
RBC: 3.54 M/uL — ABNORMAL LOW (ref 4.20–5.30)
RDW: 13.5 % (ref 11.6–14.5)
WBC: 4.9 K/uL (ref 4.6–13.2)

## 2024-02-04 LAB — HEMOGLOBIN A1C: Hemoglobin A1C: 5.5 % (ref 4.2–5.6)

## 2024-02-04 LAB — RENAL FUNCTION PANEL
Anion Gap: 10 mmol/L (ref 3.0–18.0)
CO2: 23 mmol/L (ref 21–32)
Chloride: 103 mmol/L (ref 98–107)
Est, Glom Filt Rate: 42 ml/min/1.73m2 — ABNORMAL LOW (ref >60)
Potassium: 4.5 mmol/L (ref 3.5–5.5)
Sodium: 136 mmol/L (ref 136–145)

## 2024-02-04 LAB — VITAMIN D 25 HYDROXY: Vit D, 25-Hydroxy: 88 ng/mL (ref 30.0–100.0)

## 2024-02-04 LAB — LIPID PANEL
Cholesterol, Total: 109 mg/dL
VLDL Cholesterol Calculated: 14 mg/dL

## 2024-02-04 LAB — VITAMIN B12 & FOLATE: Folate: 10.7 ng/mL (ref 4.6–34.8)

## 2024-02-05 LAB — ANA COMPREHENSIVE PANEL
Anti-RNP: 0.2 AI (ref 0.0–0.9)
CENTROMERE PROTEIN B ANTIBODY: <0.2 AI (ref 0.0–0.9)
Chromatin Antibody: <0.2 AI (ref 0.0–0.9)
ENA JO-1 Ab: <0.2 AI (ref 0.0–0.9)
ENA SCL-70 Ab: <0.2 AI (ref 0.0–0.9)
ENA SSA (RO) Ab: <0.2 AI (ref 0.0–0.9)
ENA SSB (LA) Ab: <0.2 AI (ref 0.0–0.9)
ENA Smith (SM) Ab: <0.2 AI (ref 0.0–0.9)
dsDNA Ab: <1 [IU]/mL (ref 0–9)

## 2024-02-06 LAB — CYSTATIN C: Cystatin C: 1.07 mg/L (ref 0.87–1.12)

## 2024-02-06 LAB — COMPLEMENT COMPONENTS 3 & 4
C3 Complement: 139 mg/dL (ref 82–167)
C4 Complement: 31 mg/dL (ref 12–38)

## 2024-02-08 LAB — ANCA PANEL
Atypical pANCA: <1:20 {titer}
Cytoplasmic (C-ANCA): <1:20 {titer}
Myeloperoxidase Ab: <0.2 U (ref 0.0–0.9)
Perinuclear (P-ANCA): <1:20 {titer}
Proteinase 3 AB: <0.2 U (ref 0.0–0.9)

## 2024-03-18 ENCOUNTER — Encounter

## 2024-03-18 ENCOUNTER — Inpatient Hospital Stay: Admit: 2024-03-18 | Payer: Medicare (Managed Care) | Primary: Internal Medicine

## 2024-03-18 DIAGNOSIS — N179 Acute kidney failure, unspecified: Secondary | ICD-10-CM

## 2024-03-18 LAB — CBC WITH AUTO DIFFERENTIAL
Basophils %: 1.2 % (ref 0.0–2.0)
Basophils Absolute: 0.06 K/UL (ref 0.00–0.10)
Eosinophils %: 7.5 % — ABNORMAL HIGH (ref 0.0–5.0)
Eosinophils Absolute: 0.37 K/UL (ref 0.00–0.40)
Hematocrit: 37.5 % (ref 35.0–45.0)
Hemoglobin: 12.1 g/dL (ref 12.0–16.0)
Immature Granulocytes %: 0.2 % (ref 0.0–0.5)
Immature Granulocytes Absolute: 0.01 K/UL (ref 0.00–0.04)
Lymphocytes %: 31.8 % (ref 21.0–52.0)
Lymphocytes Absolute: 1.57 K/UL (ref 0.90–3.60)
MCH: 29.3 pg (ref 24.0–34.0)
MCHC: 32.3 g/dL (ref 31.0–37.0)
MCV: 90.8 FL (ref 78.0–100.0)
MPV: 9.3 FL (ref 9.2–11.8)
Monocytes %: 7.9 % (ref 3.0–10.0)
Monocytes Absolute: 0.39 K/UL (ref 0.05–1.20)
Neutrophils %: 51.4 % (ref 40.0–73.0)
Neutrophils Absolute: 2.54 K/UL (ref 1.80–8.00)
Nucleated RBCs: 0 /100{WBCs}
Platelets: 242 K/uL (ref 135–420)
RBC: 4.13 M/uL — ABNORMAL LOW (ref 4.20–5.30)
RDW: 13.1 % (ref 11.6–14.5)
WBC: 4.9 K/uL (ref 4.6–13.2)
nRBC: 0 K/uL (ref 0.00–0.01)

## 2024-03-18 LAB — COMPREHENSIVE METABOLIC PANEL
ALT: 17 U/L (ref 10–35)
AST: 25 U/L (ref 10–38)
Albumin/Globulin Ratio: 1.2 (ref 0.8–1.7)
Albumin: 3.5 g/dL (ref 3.4–5.0)
Alk Phosphatase: 89 U/L (ref 45–117)
Anion Gap: 8 mmol/L (ref 3.0–18.0)
BUN/Creatinine Ratio: 11 — ABNORMAL LOW (ref 12–20)
BUN: 13 mg/dL (ref 6–23)
CO2: 23 mmol/L (ref 21–32)
Calcium: 9.7 mg/dL (ref 8.6–10.2)
Chloride: 107 mmol/L (ref 98–107)
Creatinine: 1.23 mg/dL (ref 0.6–1.3)
Est, Glom Filt Rate: 44 ml/min/1.73m2 — ABNORMAL LOW (ref >60)
Globulin: 3 g/dL (ref 2.0–4.0)
Glucose: 99 mg/dL (ref 74–108)
Potassium: 4.2 mmol/L (ref 3.5–5.5)
Sodium: 139 mmol/L (ref 136–145)
Total Bilirubin: 0.9 mg/dL (ref 0.2–1.0)
Total Protein: 6.5 g/dL (ref 6.4–8.2)

## 2024-03-18 LAB — LACTATE DEHYDROGENASE: LD: 260 U/L — ABNORMAL HIGH (ref 81–231)

## 2024-03-18 LAB — HEPATITIS C ANTIBODY: Hepatitis C Ab: NONREACTIVE

## 2024-03-18 LAB — URIC ACID: Uric Acid: 4.4 mg/dL (ref 2.6–7.2)

## 2024-03-18 LAB — LACTIC ACID: Lactic Acid: 0.7 mmol/L (ref 0.4–2.0)

## 2024-03-18 LAB — HEPATITIS B CORE ANTIBODY, IGM: Hep B Core Ab, IgM: NONREACTIVE

## 2024-03-18 LAB — CK: Total CK: 65 U/L (ref 26–192)

## 2024-03-19 LAB — COMPLEMENT COMPONENTS 3 & 4
C3 Complement: 149 mg/dL (ref 82–167)
C4 Complement: 32 mg/dL (ref 12–38)

## 2024-03-19 LAB — ANA COMPREHENSIVE PANEL
Anti-RNP: 0.3 AI (ref 0.0–0.9)
CENTROMERE PROTEIN B ANTIBODY: <0.2 AI (ref 0.0–0.9)
Chromatin Antibody: 0.3 AI (ref 0.0–0.9)
ENA JO-1 Ab: <0.2 AI (ref 0.0–0.9)
ENA SCL-70 Ab: 0.2 AI (ref 0.0–0.9)
ENA SSA (RO) Ab: <0.2 AI (ref 0.0–0.9)
ENA SSB (LA) Ab: <0.2 AI (ref 0.0–0.9)
ENA Smith (SM) Ab: <0.2 AI (ref 0.0–0.9)
dsDNA Ab: 3 [IU]/mL (ref 0–9)

## 2024-03-19 LAB — BETA 2 MICROGLOBULIN, SERUM: Beta-2 Microglobulin: 2.6 mg/L — ABNORMAL HIGH (ref 0.6–2.4)

## 2024-03-20 LAB — COMPLEMENT, TOTAL: Compl, Total (CH50): 55 U/mL (ref >41)

## 2024-03-21 LAB — KAPPA/LAMBDA QUANTITATIVE FREE LIGHT CHAINS, SERUM
Free Kappa Light Chains: 33.9 mg/L — ABNORMAL HIGH (ref 3.3–19.4)
Free Lambda Light Chains: 24.1 mg/L (ref 5.7–26.3)
K/L Ratio: 1.41 (ref 0.26–1.65)

## 2024-03-22 LAB — ELECTROPHORESIS PROTEIN, SERUM
Albumin/Globulin Ratio: 1.1 (ref 0.7–1.7)
Albumin: 3.4 g/dL (ref 2.9–4.4)
Alpha-1-Globulin: 0.3 g/dL (ref 0.0–0.4)
Alpha-2-Globulin: 0.6 g/dL (ref 0.4–1.0)
Beta Globulin: 1.1 g/dL (ref 0.7–1.3)
Gamma Globulin: 1.2 g/dL (ref 0.4–1.8)
Globulin: 3.1 g/dL (ref 2.2–3.9)
M-Spike: 0.3 g/dL — ABNORMAL HIGH
Total Protein: 6.5 g/dL (ref 6.0–8.5)

## 2024-03-22 LAB — ANCA PANEL
Atypical pANCA: <1:20 {titer}
Cytoplasmic (C-ANCA): <1:20 {titer}
Myeloperoxidase Ab: 0.2 U (ref 0.0–0.9)
Perinuclear (P-ANCA): <1:20 {titer}
Proteinase 3 AB: <0.2 U (ref 0.0–0.9)

## 2024-03-22 LAB — GLOMERULAR BASEMENT MEMBRANE ANTIBODIES: Glomerular Basement Membrane Ab, IgG: <0.2 U (ref 0.0–0.9)

## 2024-03-22 LAB — CRYOGLOBULIN

## 2024-07-01 ENCOUNTER — Ambulatory Visit: Admitting: Family

## 2024-09-23 ENCOUNTER — Ambulatory Visit
